# Patient Record
Sex: Female | Born: 1985
Health system: Southern US, Community
[De-identification: ages and names within clinical notes are randomized; demographics above are authoritative.]

## PROBLEM LIST (undated history)

## (undated) DIAGNOSIS — D500 Iron deficiency anemia secondary to blood loss (chronic): Secondary | ICD-10-CM

## (undated) DIAGNOSIS — O139 Gestational [pregnancy-induced] hypertension without significant proteinuria, unspecified trimester: Secondary | ICD-10-CM

## (undated) DIAGNOSIS — Q341 Congenital cyst of mediastinum: Secondary | ICD-10-CM

## (undated) HISTORY — DX: Morbid (severe) obesity due to excess calories: E66.01

## (undated) HISTORY — DX: Iron deficiency anemia secondary to blood loss (chronic): D50.0

---

## 2007-11-21 HISTORY — PX: GASTRIC BYPASS: SHX52

## 2008-05-20 HISTORY — PX: GASTRIC BYPASS: SHX52

## 2008-11-20 HISTORY — PX: BONE CYST EXCISION: SHX376

## 2010-09-20 HISTORY — PX: WISDOM TOOTH EXTRACTION: SHX21

## 2010-10-26 ENCOUNTER — Emergency Department (HOSPITAL_COMMUNITY)
Admission: EM | Admit: 2010-10-26 | Discharge: 2010-10-26 | Payer: Self-pay | Source: Home / Self Care | Admitting: Emergency Medicine

## 2011-01-31 LAB — POCT URINALYSIS DIPSTICK
Bilirubin Urine: NEGATIVE
Nitrite: NEGATIVE
pH: 5.5 (ref 5.0–8.0)

## 2011-03-22 LAB — ABO/RH: RH Type: POSITIVE

## 2011-05-22 ENCOUNTER — Ambulatory Visit (INDEPENDENT_AMBULATORY_CARE_PROVIDER_SITE_OTHER): Payer: Self-pay | Admitting: Surgery

## 2011-06-06 ENCOUNTER — Ambulatory Visit (INDEPENDENT_AMBULATORY_CARE_PROVIDER_SITE_OTHER): Payer: Self-pay | Admitting: Surgery

## 2011-06-06 ENCOUNTER — Ambulatory Visit (INDEPENDENT_AMBULATORY_CARE_PROVIDER_SITE_OTHER): Payer: Medicaid Other | Admitting: Surgery

## 2011-06-06 ENCOUNTER — Encounter (INDEPENDENT_AMBULATORY_CARE_PROVIDER_SITE_OTHER): Payer: Self-pay | Admitting: Surgery

## 2011-06-06 VITALS — BP 128/78 | HR 78 | Temp 96.4°F | Ht 66.5 in | Wt 300.0 lb

## 2011-06-06 DIAGNOSIS — L732 Hidradenitis suppurativa: Secondary | ICD-10-CM

## 2011-06-06 NOTE — Patient Instructions (Signed)
Follow up after your baby is delivered.  You will need excision of these area.

## 2011-06-06 NOTE — Progress Notes (Signed)
Lacey Whitaker is a 25 y.o. female.    Chief Complaint  Patient presents with  . Cyst    eval of cyst on mid chest    HPI HPI  The patient presents today due to the cyst over her sternum. She is at the request of Dr. Tanya Nones. The cyst has been present for many years. She had one removed in a similar spot last year. The area will swell and drained. It has been red and hot in the past. She was placed on antibiotics and the areas have got better recently. She is 6 months pregnant.   History reviewed. No pertinent past medical history.  Past Surgical History  Procedure Date  . Gastric bypass 05/2008    Family History  Problem Relation Age of Onset  . Asthma Mother   . Diabetes Mother   . Asthma Brother     Social History History  Substance Use Topics  . Smoking status: Never Smoker   . Smokeless tobacco: Not on file  . Alcohol Use: No     pregnant at time of appt    No Known Allergies  Current Outpatient Prescriptions  Medication Sig Dispense Refill  . Carbonyl Iron (FEOSOL PO) Take by mouth 2 (two) times daily. Patient unsure of dosage at this time.       . Nutritional Supplements (VITAMIN D BOOSTER PO) Take by mouth 4 (four) times daily.          Review of Systems Review of Systems  Constitutional: Negative for fever and chills.  HENT: Negative.   Eyes: Negative.   Respiratory: Negative.   Cardiovascular: Negative.   Gastrointestinal: Negative.   Genitourinary: Negative.   Musculoskeletal: Negative.   Skin: Negative.   Neurological: Negative.   Endo/Heme/Allergies: Negative.     Physical Exam Physical Exam  Constitutional: He appears well-developed and well-nourished.  HENT:  Head: Normocephalic and atraumatic.  Eyes: Conjunctivae and EOM are normal. Pupils are equal, round, and reactive to light.  Neck: Normal range of motion. Neck supple.  GI: Soft. Bowel sounds are normal.       gravid  Skin:          Along the inferior mammary fold are two area of  hidradenitis.  They are not infected or red.     Blood pressure 128/78, pulse 78, temperature 96.4 F (35.8 C), temperature source Temporal, height 5' 6.5" (1.689 m), weight 300 lb (136.079 kg).  Assessment/PlanHidradenitis involving inferior mammary fold bilaterally.  Plan: I have recommended excision of these areas after she delivered her baby. She has no signs of acute infection today she developed an acute infection, antibiotics with potential incision and drainage may be needed. She will return to see me in about 3 months to schedule outpatient surgery for this.   Jerney Baksh A. 06/06/2011, 11:31 AM

## 2011-07-13 ENCOUNTER — Other Ambulatory Visit: Payer: Self-pay | Admitting: Oncology

## 2011-07-13 ENCOUNTER — Encounter: Payer: Self-pay | Admitting: Oncology

## 2011-07-13 ENCOUNTER — Encounter (HOSPITAL_BASED_OUTPATIENT_CLINIC_OR_DEPARTMENT_OTHER): Payer: Self-pay | Admitting: Oncology

## 2011-07-13 DIAGNOSIS — D509 Iron deficiency anemia, unspecified: Secondary | ICD-10-CM

## 2011-07-13 LAB — COMPREHENSIVE METABOLIC PANEL
AST: 10 U/L (ref 0–37)
Alkaline Phosphatase: 118 U/L — ABNORMAL HIGH (ref 39–117)
BUN: 6 mg/dL (ref 6–23)
Calcium: 8.8 mg/dL (ref 8.4–10.5)
Chloride: 104 mEq/L (ref 96–112)
Creatinine, Ser: 0.56 mg/dL (ref 0.50–1.35)
Glucose, Bld: 77 mg/dL (ref 70–99)

## 2011-07-13 LAB — CBC WITH DIFFERENTIAL/PLATELET
Basophils Absolute: 0 10*3/uL (ref 0.0–0.1)
Eosinophils Absolute: 0.2 10*3/uL (ref 0.0–0.5)
HCT: 23 % — ABNORMAL LOW (ref 38.4–49.9)
HGB: 7.2 g/dL — ABNORMAL LOW (ref 13.0–17.1)
LYMPH%: 14 % (ref 14.0–49.0)
MCV: 62.2 fL — ABNORMAL LOW (ref 79.3–98.0)
MONO%: 6.6 % (ref 0.0–14.0)
NEUT#: 10.6 10*3/uL — ABNORMAL HIGH (ref 1.5–6.5)
NEUT%: 78 % — ABNORMAL HIGH (ref 39.0–75.0)
Platelets: 318 10*3/uL (ref 140–400)

## 2011-07-13 LAB — IRON AND TIBC
Iron: 15 ug/dL — ABNORMAL LOW (ref 42–165)
TIBC: 461 ug/dL — ABNORMAL HIGH (ref 215–435)
UIBC: 446 ug/dL

## 2011-07-13 LAB — VITAMIN B12: Vitamin B-12: 290 pg/mL (ref 211–911)

## 2011-07-13 LAB — MORPHOLOGY: PLT EST: ADEQUATE

## 2011-07-18 ENCOUNTER — Other Ambulatory Visit: Payer: Self-pay | Admitting: Obstetrics and Gynecology

## 2011-07-20 ENCOUNTER — Other Ambulatory Visit (HOSPITAL_COMMUNITY): Payer: Self-pay | Admitting: Obstetrics and Gynecology

## 2011-07-20 DIAGNOSIS — D649 Anemia, unspecified: Secondary | ICD-10-CM

## 2011-07-20 MED ORDER — DIPHENHYDRAMINE HCL 25 MG PO CAPS: 25.0000 mg | ORAL_CAPSULE | Freq: Once | ORAL | Status: DC

## 2011-07-20 MED ORDER — FERUMOXYTOL INJECTION 510 MG/17 ML: 1020.0000 mg | Freq: Once | INTRAVENOUS | Status: DC

## 2011-07-20 MED ORDER — ACETAMINOPHEN 325 MG PO TABS: 325.0000 mg | ORAL_TABLET | Freq: Once | ORAL | Status: AC

## 2011-07-25 ENCOUNTER — Ambulatory Visit (HOSPITAL_COMMUNITY)
Admission: AD | Admit: 2011-07-25 | Discharge: 2011-07-25 | Disposition: A | Discharge status: Home / Self Care | Payer: Medicaid Other | Attending: Obstetrics and Gynecology | Admitting: Obstetrics and Gynecology

## 2011-07-25 ENCOUNTER — Encounter (HOSPITAL_COMMUNITY): Payer: Self-pay | Admitting: *Deleted

## 2011-07-25 DIAGNOSIS — O99019 Anemia complicating pregnancy, unspecified trimester: Secondary | ICD-10-CM | POA: Insufficient documentation

## 2011-07-25 DIAGNOSIS — D649 Anemia, unspecified: Secondary | ICD-10-CM | POA: Insufficient documentation

## 2011-07-25 HISTORY — DX: Gestational (pregnancy-induced) hypertension without significant proteinuria, unspecified trimester: O13.9

## 2011-07-25 HISTORY — DX: Congenital cyst of mediastinum: Q34.1

## 2011-07-25 MED ORDER — ACETAMINOPHEN 325 MG PO TABS
325.0000 mg | ORAL_TABLET | Freq: Once | ORAL | Status: AC
  Administered 2011-07-25: 325 mg via ORAL
  Filled 2011-07-25: qty 1

## 2011-07-25 MED ORDER — DIPHENHYDRAMINE HCL 25 MG PO CAPS
25.0000 mg | ORAL_CAPSULE | Freq: Once | ORAL | Status: AC
  Administered 2011-07-25: 25 mg via ORAL
  Filled 2011-07-25: qty 1

## 2011-07-25 MED ORDER — SODIUM CHLORIDE 0.9 % IV SOLN
1020.0000 mg | Freq: Once | INTRAVENOUS | Status: AC
  Administered 2011-07-25: 1020 mg via INTRAVENOUS
  Filled 2011-07-25: qty 34

## 2011-07-25 NOTE — Progress Notes (Signed)
Iron infusion complete. Pt. Tolerated well. [name] A

## 2011-07-25 NOTE — Progress Notes (Signed)
IV Fereheme infusion started at 1030. Pt currently without complaints. [name] A

## 2011-07-25 NOTE — Progress Notes (Signed)
Patient has arrived this morning for IV Iron infusion.  We have discussed the rationale for this therapy and both she and her significant other have verbalized understanding. [name] A

## 2011-07-25 NOTE — Progress Notes (Signed)
Subjective: Patient reports for IV iron infusion.  Orders called in by Hematology.  No complaints.  Objective: I have reviewed patient's vital signs.  General: alert Resp: clear to auscultation bilaterally Cardio: regular rate and rhythm, S1, S2 normal, no murmur, click, rub or gallop Extremities: extremities normal, atraumatic, no cyanosis or edema  FHR 135 pre and 142 post infusion   Assessment/Plan: 25yo G3P2 at about 32wks presented for IV iron infusion.  Orders per hematology.  Exam per RN Lynnda Child.  Pt's BP slightly elevated after IV iron infusion and pt reported a remote h/o elevated BPs.  After observing for about an hour post infusion, pt's BPs had normalized and she was d/c'd home with f/u as scheduled in office.  LOS: 0 days    Jeniffer Culliver Y 07/25/2011, 5:24 PM

## 2011-07-25 NOTE — Progress Notes (Signed)
BP's elevated-Dr. Su Hilt made aware. Will continue to monitor BP q56min for an additional hour. [name] A

## 2011-07-27 ENCOUNTER — Encounter (INDEPENDENT_AMBULATORY_CARE_PROVIDER_SITE_OTHER): Payer: Self-pay | Admitting: Surgery

## 2011-08-09 ENCOUNTER — Other Ambulatory Visit: Payer: Self-pay | Admitting: Obstetrics and Gynecology

## 2011-08-10 ENCOUNTER — Other Ambulatory Visit: Payer: Self-pay | Admitting: Obstetrics and Gynecology

## 2011-08-10 ENCOUNTER — Other Ambulatory Visit (HOSPITAL_COMMUNITY): Payer: Self-pay | Admitting: Obstetrics and Gynecology

## 2011-08-10 DIAGNOSIS — D649 Anemia, unspecified: Secondary | ICD-10-CM

## 2011-08-10 DIAGNOSIS — J45909 Unspecified asthma, uncomplicated: Secondary | ICD-10-CM | POA: Insufficient documentation

## 2011-08-10 DIAGNOSIS — Z98891 History of uterine scar from previous surgery: Secondary | ICD-10-CM

## 2011-08-10 DIAGNOSIS — O2690 Pregnancy related conditions, unspecified, unspecified trimester: Secondary | ICD-10-CM | POA: Insufficient documentation

## 2011-08-16 LAB — STREP B DNA PROBE: GBS: NEGATIVE

## 2011-09-02 ENCOUNTER — Encounter (HOSPITAL_COMMUNITY): Payer: Self-pay

## 2011-09-02 ENCOUNTER — Encounter (HOSPITAL_COMMUNITY)
Admission: AD | Disposition: A | Discharge status: Home / Self Care | Payer: Self-pay | Source: Ambulatory Visit | Attending: Obstetrics and Gynecology

## 2011-09-02 ENCOUNTER — Encounter (HOSPITAL_COMMUNITY): Payer: Self-pay | Admitting: Anesthesiology

## 2011-09-02 ENCOUNTER — Inpatient Hospital Stay (HOSPITAL_COMMUNITY)
Admission: AD | Admit: 2011-09-02 | Discharge: 2011-09-05 | DRG: 765 | Disposition: A | Discharge status: Home / Self Care | Payer: Medicaid Other | Source: Ambulatory Visit | Attending: Obstetrics and Gynecology | Admitting: Obstetrics and Gynecology

## 2011-09-02 ENCOUNTER — Inpatient Hospital Stay (HOSPITAL_COMMUNITY): Payer: Medicaid Other | Admitting: Anesthesiology

## 2011-09-02 ENCOUNTER — Other Ambulatory Visit: Payer: Self-pay | Admitting: Obstetrics and Gynecology

## 2011-09-02 DIAGNOSIS — O4700 False labor before 37 completed weeks of gestation, unspecified trimester: Secondary | ICD-10-CM

## 2011-09-02 DIAGNOSIS — O2690 Pregnancy related conditions, unspecified, unspecified trimester: Secondary | ICD-10-CM

## 2011-09-02 DIAGNOSIS — O34219 Maternal care for unspecified type scar from previous cesarean delivery: Principal | ICD-10-CM | POA: Diagnosis present

## 2011-09-02 DIAGNOSIS — O10919 Unspecified pre-existing hypertension complicating pregnancy, unspecified trimester: Secondary | ICD-10-CM

## 2011-09-02 DIAGNOSIS — Z98891 History of uterine scar from previous surgery: Secondary | ICD-10-CM | POA: Diagnosis not present

## 2011-09-02 DIAGNOSIS — O99214 Obesity complicating childbirth: Secondary | ICD-10-CM | POA: Diagnosis present

## 2011-09-02 DIAGNOSIS — O1002 Pre-existing essential hypertension complicating childbirth: Secondary | ICD-10-CM | POA: Diagnosis present

## 2011-09-02 DIAGNOSIS — Z22330 Carrier of Group B streptococcus: Secondary | ICD-10-CM

## 2011-09-02 DIAGNOSIS — D649 Anemia, unspecified: Secondary | ICD-10-CM | POA: Diagnosis present

## 2011-09-02 DIAGNOSIS — E669 Obesity, unspecified: Secondary | ICD-10-CM | POA: Diagnosis present

## 2011-09-02 DIAGNOSIS — O9902 Anemia complicating childbirth: Secondary | ICD-10-CM | POA: Diagnosis present

## 2011-09-02 DIAGNOSIS — O99892 Other specified diseases and conditions complicating childbirth: Secondary | ICD-10-CM | POA: Diagnosis present

## 2011-09-02 DIAGNOSIS — IMO0002 Reserved for concepts with insufficient information to code with codable children: Secondary | ICD-10-CM

## 2011-09-02 LAB — CBC
HCT: 31.2 % — ABNORMAL LOW (ref 36.0–46.0)
HCT: 30.7 % — ABNORMAL LOW (ref 36.0–46.0)
Hemoglobin: 9.9 g/dL — ABNORMAL LOW (ref 12.0–15.0)
Hemoglobin: 9.5 g/dL — ABNORMAL LOW (ref 12.0–15.0)
MCH: 22.5 pg — ABNORMAL LOW (ref 26.0–34.0)
MCHC: 30.9 g/dL (ref 30.0–36.0)
MCV: 70.7 fL — ABNORMAL LOW (ref 78.0–100.0)
WBC: 9.9 10*3/uL (ref 4.0–10.5)

## 2011-09-02 LAB — ABO/RH: ABO/RH(D): O POS

## 2011-09-02 SURGERY — Surgical Case
Anesthesia: Spinal | Site: Abdomen | Wound class: Clean Contaminated

## 2011-09-02 MED ORDER — BUTORPHANOL TARTRATE 2 MG/ML IJ SOLN
2.0000 mg | Freq: Once | INTRAMUSCULAR | Status: AC
  Administered 2011-09-02: 2 mg via INTRAVENOUS
  Filled 2011-09-02: qty 1

## 2011-09-02 MED ORDER — ONDANSETRON HCL 4 MG PO TABS: 4.0000 mg | ORAL_TABLET | ORAL | Status: DC | PRN

## 2011-09-02 MED ORDER — EPHEDRINE 5 MG/ML INJ
INTRAVENOUS | Status: AC
  Filled 2011-09-02: qty 10

## 2011-09-02 MED ORDER — OXYTOCIN 20 UNITS IN LACTATED RINGERS INFUSION - SIMPLE
INTRAVENOUS | Status: DC | PRN
  Administered 2011-09-02 (×2): 20 [IU] via INTRAVENOUS

## 2011-09-02 MED ORDER — DIPHENHYDRAMINE HCL 50 MG/ML IJ SOLN: 25.0000 mg | INTRAMUSCULAR | Status: DC | PRN

## 2011-09-02 MED ORDER — DIPHENHYDRAMINE HCL 25 MG PO CAPS
25.0000 mg | ORAL_CAPSULE | ORAL | Status: DC | PRN
  Filled 2011-09-02 (×2): qty 1

## 2011-09-02 MED ORDER — BUPIVACAINE IN DEXTROSE 0.75-8.25 % IT SOLN
INTRATHECAL | Status: DC | PRN
  Administered 2011-09-02: 1.6 mL via INTRATHECAL

## 2011-09-02 MED ORDER — CEFAZOLIN SODIUM 1-5 GM-% IV SOLN
INTRAVENOUS | Status: AC
  Filled 2011-09-02: qty 50

## 2011-09-02 MED ORDER — SENNOSIDES-DOCUSATE SODIUM 8.6-50 MG PO TABS
2.0000 | ORAL_TABLET | Freq: Every day | ORAL | Status: DC
  Administered 2011-09-02 – 2011-09-04 (×3): 2 via ORAL

## 2011-09-02 MED ORDER — SCOPOLAMINE 1 MG/3DAYS TD PT72
1.0000 | MEDICATED_PATCH | Freq: Once | TRANSDERMAL | Status: DC
Start: 2011-09-02 — End: 2011-09-02
  Administered 2011-09-02: 1.5 mg via TRANSDERMAL

## 2011-09-02 MED ORDER — LACTATED RINGERS IV SOLN
INTRAVENOUS | Status: DC
  Administered 2011-09-02 (×3): via INTRAVENOUS

## 2011-09-02 MED ORDER — CITRIC ACID-SODIUM CITRATE 334-500 MG/5ML PO SOLN
30.0000 mL | Freq: Once | ORAL | Status: AC
  Administered 2011-09-02: 30 mL via ORAL
  Filled 2011-09-02: qty 15

## 2011-09-02 MED ORDER — FENTANYL CITRATE 0.05 MG/ML IJ SOLN
INTRAMUSCULAR | Status: AC
  Filled 2011-09-02: qty 2

## 2011-09-02 MED ORDER — WITCH HAZEL-GLYCERIN EX PADS: 1.0000 "application " | MEDICATED_PAD | CUTANEOUS | Status: DC | PRN

## 2011-09-02 MED ORDER — LANOLIN HYDROUS EX OINT: 1.0000 "application " | TOPICAL_OINTMENT | CUTANEOUS | Status: DC | PRN

## 2011-09-02 MED ORDER — CEFOXITIN SODIUM 2 G IV SOLR
2.0000 g | Freq: Once | INTRAVENOUS | Status: AC
  Administered 2011-09-02: 2 g via INTRAVENOUS
  Filled 2011-09-02: qty 2

## 2011-09-02 MED ORDER — ONDANSETRON HCL 4 MG/2ML IJ SOLN: 4.0000 mg | INTRAMUSCULAR | Status: DC | PRN

## 2011-09-02 MED ORDER — KETOROLAC TROMETHAMINE 30 MG/ML IJ SOLN
30.0000 mg | Freq: Once | INTRAMUSCULAR | Status: AC
  Administered 2011-09-02: 30 mg via INTRAVENOUS
  Filled 2011-09-02: qty 1

## 2011-09-02 MED ORDER — FENTANYL CITRATE 0.05 MG/ML IJ SOLN
INTRAMUSCULAR | Status: AC
  Administered 2011-09-02: 50 ug via INTRAVENOUS
  Filled 2011-09-02: qty 2

## 2011-09-02 MED ORDER — SIMETHICONE 80 MG PO CHEW
80.0000 mg | CHEWABLE_TABLET | Freq: Three times a day (TID) | ORAL | Status: DC
  Administered 2011-09-02 – 2011-09-05 (×10): 80 mg via ORAL

## 2011-09-02 MED ORDER — OXYTOCIN 10 UNIT/ML IJ SOLN
INTRAMUSCULAR | Status: AC
  Filled 2011-09-02: qty 2

## 2011-09-02 MED ORDER — ONDANSETRON HCL 4 MG/2ML IJ SOLN
INTRAMUSCULAR | Status: DC | PRN
  Administered 2011-09-02: 4 mg via INTRAVENOUS

## 2011-09-02 MED ORDER — ONDANSETRON HCL 4 MG/2ML IJ SOLN
INTRAMUSCULAR | Status: AC
  Filled 2011-09-02: qty 2

## 2011-09-02 MED ORDER — OXYTOCIN 20 UNITS IN LACTATED RINGERS INFUSION - SIMPLE: 125.0000 mL/h | INTRAVENOUS | Status: AC

## 2011-09-02 MED ORDER — METOCLOPRAMIDE HCL 5 MG/ML IJ SOLN: 10.0000 mg | Freq: Once | INTRAMUSCULAR | Status: DC | PRN

## 2011-09-02 MED ORDER — FENTANYL CITRATE 0.05 MG/ML IJ SOLN
INTRAMUSCULAR | Status: DC | PRN
  Administered 2011-09-02: 25 ug via INTRATHECAL

## 2011-09-02 MED ORDER — MORPHINE SULFATE (PF) 0.5 MG/ML IJ SOLN
INTRAMUSCULAR | Status: DC | PRN
  Administered 2011-09-02: .15 mg via INTRATHECAL

## 2011-09-02 MED ORDER — CEFAZOLIN SODIUM-DEXTROSE 2-3 GM-% IV SOLR
2.0000 g | INTRAVENOUS | Status: DC
  Filled 2011-09-02: qty 50

## 2011-09-02 MED ORDER — FAMOTIDINE IN NACL 20-0.9 MG/50ML-% IV SOLN
20.0000 mg | Freq: Once | INTRAVENOUS | Status: AC
  Administered 2011-09-02: 20 mg via INTRAVENOUS
  Filled 2011-09-02: qty 50

## 2011-09-02 MED ORDER — CEFAZOLIN SODIUM 1-5 GM-% IV SOLN
INTRAVENOUS | Status: DC | PRN
  Administered 2011-09-02: 1 g via INTRAVENOUS

## 2011-09-02 MED ORDER — DIPHENHYDRAMINE HCL 50 MG/ML IJ SOLN
12.5000 mg | INTRAMUSCULAR | Status: DC | PRN
  Administered 2011-09-02 (×2): 12.5 mg via INTRAVENOUS
  Filled 2011-09-02: qty 1

## 2011-09-02 MED ORDER — TETANUS-DIPHTH-ACELL PERTUSSIS 5-2.5-18.5 LF-MCG/0.5 IM SUSP
0.5000 mL | Freq: Once | INTRAMUSCULAR | Status: AC
  Administered 2011-09-04: 0.5 mL via INTRAMUSCULAR
  Filled 2011-09-02: qty 0.5

## 2011-09-02 MED ORDER — OXYCODONE-ACETAMINOPHEN 5-325 MG PO TABS
1.0000 | ORAL_TABLET | ORAL | Status: DC | PRN
  Administered 2011-09-03: 1 via ORAL
  Administered 2011-09-03: 2 via ORAL
  Administered 2011-09-03: 1 via ORAL
  Administered 2011-09-03: 2 via ORAL
  Administered 2011-09-03 (×3): 1 via ORAL
  Administered 2011-09-04 (×2): 2 via ORAL
  Administered 2011-09-04: 1 via ORAL
  Administered 2011-09-04 (×2): 2 via ORAL
  Administered 2011-09-04: 1 via ORAL
  Administered 2011-09-05 (×4): 2 via ORAL
  Filled 2011-09-02: qty 1
  Filled 2011-09-02: qty 2
  Filled 2011-09-02: qty 1
  Filled 2011-09-02: qty 2
  Filled 2011-09-02 (×2): qty 1
  Filled 2011-09-02 (×2): qty 2
  Filled 2011-09-02: qty 1
  Filled 2011-09-02: qty 2
  Filled 2011-09-02 (×2): qty 1
  Filled 2011-09-02 (×5): qty 2

## 2011-09-02 MED ORDER — ZOLPIDEM TARTRATE 5 MG PO TABS: 5.0000 mg | ORAL_TABLET | Freq: Every evening | ORAL | Status: DC | PRN

## 2011-09-02 MED ORDER — DIPHENHYDRAMINE HCL 25 MG PO CAPS
25.0000 mg | ORAL_CAPSULE | Freq: Four times a day (QID) | ORAL | Status: DC | PRN
  Administered 2011-09-03: 25 mg via ORAL

## 2011-09-02 MED ORDER — KETOROLAC TROMETHAMINE 60 MG/2ML IM SOLN
INTRAMUSCULAR | Status: AC
  Administered 2011-09-02: 60 mg via INTRAMUSCULAR
  Filled 2011-09-02: qty 2

## 2011-09-02 MED ORDER — LACTATED RINGERS IV SOLN
INTRAVENOUS | Status: DC
  Administered 2011-09-02: 17:00:00 via INTRAVENOUS

## 2011-09-02 MED ORDER — SIMETHICONE 80 MG PO CHEW: 80.0000 mg | CHEWABLE_TABLET | ORAL | Status: DC | PRN

## 2011-09-02 MED ORDER — MENTHOL 3 MG MT LOZG: 1.0000 | LOZENGE | OROMUCOSAL | Status: DC | PRN

## 2011-09-02 MED ORDER — FENTANYL CITRATE 0.05 MG/ML IJ SOLN
25.0000 ug | INTRAMUSCULAR | Status: DC | PRN
  Administered 2011-09-02 (×3): 50 ug via INTRAVENOUS

## 2011-09-02 MED ORDER — DIPHENHYDRAMINE HCL 50 MG/ML IJ SOLN
INTRAMUSCULAR | Status: AC
  Administered 2011-09-02: 12.5 mg via INTRAVENOUS
  Filled 2011-09-02: qty 1

## 2011-09-02 MED ORDER — EPHEDRINE SULFATE 50 MG/ML IJ SOLN
INTRAMUSCULAR | Status: DC | PRN
  Administered 2011-09-02 (×3): 10 mg via INTRAVENOUS

## 2011-09-02 MED ORDER — PRENATAL PLUS 27-1 MG PO TABS
1.0000 | ORAL_TABLET | Freq: Every day | ORAL | Status: DC
  Administered 2011-09-03 – 2011-09-05 (×3): 1 via ORAL
  Filled 2011-09-02 (×3): qty 1

## 2011-09-02 MED ORDER — KETOROLAC TROMETHAMINE 60 MG/2ML IM SOLN
60.0000 mg | Freq: Once | INTRAMUSCULAR | Status: AC | PRN
  Administered 2011-09-02: 60 mg via INTRAMUSCULAR

## 2011-09-02 MED ORDER — SCOPOLAMINE 1 MG/3DAYS TD PT72
MEDICATED_PATCH | TRANSDERMAL | Status: AC
  Administered 2011-09-02: 1.5 mg via TRANSDERMAL
  Filled 2011-09-02: qty 1

## 2011-09-02 MED ORDER — FENTANYL CITRATE 0.05 MG/ML IJ SOLN: 25.0000 ug | INTRAMUSCULAR | Status: DC | PRN

## 2011-09-02 MED ORDER — DIBUCAINE 1 % RE OINT: 1.0000 "application " | TOPICAL_OINTMENT | RECTAL | Status: DC | PRN

## 2011-09-02 MED ORDER — MORPHINE SULFATE 0.5 MG/ML IJ SOLN
INTRAMUSCULAR | Status: AC
  Filled 2011-09-02: qty 10

## 2011-09-02 MED ORDER — PHENYLEPHRINE 40 MCG/ML (10ML) SYRINGE FOR IV PUSH (FOR BLOOD PRESSURE SUPPORT)
PREFILLED_SYRINGE | INTRAVENOUS | Status: AC
  Filled 2011-09-02: qty 5

## 2011-09-02 MED ORDER — MEPERIDINE HCL 25 MG/ML IJ SOLN: 6.2500 mg | INTRAMUSCULAR | Status: DC | PRN

## 2011-09-02 MED ORDER — IBUPROFEN 600 MG PO TABS: 600.0000 mg | ORAL_TABLET | Freq: Four times a day (QID) | ORAL | Status: DC

## 2011-09-02 MED ORDER — ENOXAPARIN SODIUM 30 MG/0.3ML ~~LOC~~ SOLN
30.0000 mg | SUBCUTANEOUS | Status: DC
  Administered 2011-09-02: 30 mg via SUBCUTANEOUS
  Filled 2011-09-02 (×3): qty 0.3

## 2011-09-02 SURGICAL SUPPLY — 39 items
BENZOIN TINCTURE PRP APPL 2/3 (GAUZE/BANDAGES/DRESSINGS) ×2 IMPLANT
CHLORAPREP W/TINT 26ML (MISCELLANEOUS) IMPLANT
CLOSURE STERI STRIP 1/2 X4 (GAUZE/BANDAGES/DRESSINGS) ×2 IMPLANT
CLOTH BEACON ORANGE TIMEOUT ST (SAFETY) ×2 IMPLANT
CONTAINER PREFILL 10% NBF 15ML (MISCELLANEOUS) IMPLANT
DRAIN JACKSON PRT FLT 10 (DRAIN) ×2 IMPLANT
DRESSING TELFA 8X3 (GAUZE/BANDAGES/DRESSINGS) ×2 IMPLANT
DRSG PAD ABDOMINAL 8X10 ST (GAUZE/BANDAGES/DRESSINGS) ×2 IMPLANT
ELECT REM PT RETURN 9FT ADLT (ELECTROSURGICAL) ×2
ELECTRODE REM PT RTRN 9FT ADLT (ELECTROSURGICAL) ×1 IMPLANT
EVACUATOR SILICONE 100CC (DRAIN) ×2 IMPLANT
EXTRACTOR VACUUM M CUP 4 TUBE (SUCTIONS) IMPLANT
GLOVE BIO SURGEON STRL SZ7.5 (GLOVE) ×4 IMPLANT
GLOVE BIOGEL PI IND STRL 7.5 (GLOVE) ×1 IMPLANT
GLOVE BIOGEL PI INDICATOR 7.5 (GLOVE) ×1
GOWN PREVENTION PLUS LG XLONG (DISPOSABLE) ×6 IMPLANT
GOWN STRL REIN XL XLG (GOWN DISPOSABLE) ×2 IMPLANT
KIT ABG SYR 3ML LUER SLIP (SYRINGE) IMPLANT
NEEDLE HYPO 22GX1.5 SAFETY (NEEDLE) IMPLANT
NEEDLE HYPO 25X5/8 SAFETYGLIDE (NEEDLE) IMPLANT
NS IRRIG 1000ML POUR BTL (IV SOLUTION) ×2 IMPLANT
PACK C SECTION WH (CUSTOM PROCEDURE TRAY) ×2 IMPLANT
RETRACTOR WND ALEXIS 25 LRG (MISCELLANEOUS) ×1 IMPLANT
RTRCTR WOUND ALEXIS 25CM LRG (MISCELLANEOUS) ×2
SLEEVE SCD COMPRESS KNEE MED (MISCELLANEOUS) ×2 IMPLANT
SPONGE GAUZE 4X4 12PLY (GAUZE/BANDAGES/DRESSINGS) ×2 IMPLANT
STRIP CLOSURE SKIN 1/2X4 (GAUZE/BANDAGES/DRESSINGS) ×2 IMPLANT
SUT CHROMIC 2 0 CT 1 (SUTURE) ×2 IMPLANT
SUT MNCRL AB 3-0 PS2 27 (SUTURE) ×2 IMPLANT
SUT PLAIN 0 NONE (SUTURE) IMPLANT
SUT PLAIN 2 0 XLH (SUTURE) ×2 IMPLANT
SUT VIC AB 0 CT1 36 (SUTURE) ×2 IMPLANT
SUT VIC AB 0 CTX 36 (SUTURE) ×4
SUT VIC AB 0 CTX36XBRD ANBCTRL (SUTURE) ×4 IMPLANT
SYR CONTROL 10ML LL (SYRINGE) IMPLANT
TAPE CLOTH SURG 4X10 WHT LF (GAUZE/BANDAGES/DRESSINGS) ×2 IMPLANT
TOWEL OR 17X24 6PK STRL BLUE (TOWEL DISPOSABLE) ×4 IMPLANT
TRAY FOLEY CATH 14FR (SET/KITS/TRAYS/PACK) ×2 IMPLANT
WATER STERILE IRR 1000ML POUR (IV SOLUTION) IMPLANT

## 2011-09-02 NOTE — Anesthesia Procedure Notes (Addendum)
Spinal Block  Patient location during procedure: OR Start time: 09/02/2011 8:56 AM Staffing Anesthesiologist: Aurther Harlin A. Performed by: anesthesiologist  Preanesthetic Checklist Completed: patient identified, site marked, surgical consent, pre-op evaluation, timeout performed, IV checked, risks and benefits discussed and monitors and equipment checked Spinal Block Patient position: sitting Prep: site prepped and draped and DuraPrep Patient monitoring: heart rate, cardiac monitor, continuous pulse ox and blood pressure Approach: midline Location: L3-4 Injection technique: single-shot Needle Needle type: Sprotte and Pencan  Needle gauge: 24 G Needle length: 10 cm Needle insertion depth: 8 cm Assessment Sensory level: T4 Additional Notes Patient tolerated procedure well. Adequate surgical anesthetic level.

## 2011-09-02 NOTE — Progress Notes (Signed)
Patient is brought in by ems. She states that she broke her water at 0535am, clear fluids and frequent contractions started about 5 minutes thereafter. She reports good fetal movement

## 2011-09-02 NOTE — ED Notes (Signed)
GBS negative in [facility] chart. GBS positive in patients chart.

## 2011-09-02 NOTE — ED Notes (Signed)
Lab called to get update on Type and screen results. 20 more mins. Patient notified of plan of care and OR  Plan.

## 2011-09-02 NOTE — Op Note (Signed)
Cesarean Section Procedure Note  Indications: P2 38 2/7wks with SROM in labor desiring repeat c-section.  Pre-operative Diagnosis: Previous Cesarean Section   Post-operative Diagnosis: Previous Cesarean Section  Procedure: REPEAT CESAREAN SECTION  Surgeon: Purcell Nails, MD    Assistants: Sanda Klein, CNM  Anesthesia: Spinal  Anesthesiologist: Tyrone Apple. Foster   Procedure Details  The patient was taken to the operating room after the risks, benefits, complications, treatment options, and expected outcomes were discussed with the patient.  The patient concurred with the proposed plan, giving informed consent. The patient was taken to Operating Room 1, identified as Lacey Whitaker and the procedure verified as C-Section Delivery. A Time Out was held and the above information confirmed.  After induction of anesthesia by obtaining a surgical level via the epidural, the patient was prepped and draped in the usual sterile manner. A Pfannenstiel skin incision was made and carried down through the subcutaneous tissue to the underlying layer of fascia.  The fascia was incised bilaterally and extended transversely bilaterally with the Mayo scissors. Kocher clamps were placed on the inferior aspect of the fascial incision and the underlying rectus muscle was separated from the fascia. The same was done on the superior aspect of the fascial incision.  The peritoneum was identified, entered bluntly and extended manually. The utero-vesical peritoneal reflection was incised transversely and the bladder flap was bluntly freed from the lower uterine segment. A low transverse uterine incision was made with the scalpel and extended bilaterally with the bandage scissors.  The infant was delivered in vertex position without difficulty.  After the umbilical cord was clamped and cut, the infant was handed to the awaiting pediatricians.  Cord blood was obtained for evaluation.  The placenta was removed intact  and appeared to be within normal limits. The uterus was cleared of all clots and debris. The uterine incision was closed with running interlocking sutures of 0 Vicryl and a second imbricating layer was performed as well.   Bilateral tubes and ovaries appeared to be within normal limits.  Good hemostasis was noted.  Copious irrigation was performed until clear. The peritoneum was repaired with 2-0 chromic via a running suture.  The fascia was reapproximated with a running suture of 0 Vicryl. The subcutaneous tissue was reapproximated with 3 interrupted sutures of 2-0 plain.  A size 10 JP drain was placed and sutured with 3-0 silk.  The skin was reapproximated with a subcuticular suture of 3-0 monocryl.  Half inch steristrips were applied with benzoin and pressure dressing applied.  Instrument, sponge, and needle counts were correct prior to abdominal closure and at the conclusion of the case.  The patient was awaiting transfer to the recovery room in good condition.  Findings: Live female infant with Apgars 9 at one minute and 9 at five minutes.  Normal appearing bilateral ovaries and fallopian tubes were noted.  Estimated Blood Loss:          Drains: foley with 200cc clear urine.  JP drain placed as well.         Total IV Fluids:         Specimens to Pathology: Placenta         Complications:  None; patient tolerated the procedure well.         Disposition: PACU - hemodynamically stable.         Condition: stable  Attending Attestation: I performed the procedure.

## 2011-09-02 NOTE — Anesthesia Preprocedure Evaluation (Signed)
Anesthesia Evaluation  Name, MR# and DOB Patient awake  General Assessment Comment  Reviewed: Allergy & Precautions, H&P , Patient's Chart, lab work & pertinent test results  History of Anesthesia Complications (+) PONV  Airway Mallampati: III TM Distance: >3 FB Neck ROM: Full    Dental No notable dental hx. (+) Teeth Intact   Pulmonary  Last used albuterol nebulizer yesterday. Patient has current URI  clear to auscultation  Pulmonary exam normal       Cardiovascular Regular Normal    Neuro/Psych Negative Neurological ROS  Negative Psych ROS   GI/Hepatic negative GI ROS Neg liver ROS    Endo/Other  Negative Endocrine ROSMorbid obesity  Renal/GU negative Renal ROS  Genitourinary negative   Musculoskeletal   Abdominal   Peds  Hematology negative hematology ROS (+)   Anesthesia Other Findings   Reproductive/Obstetrics (+) Pregnancy                           Anesthesia Physical Anesthesia Plan  ASA: III and Emergent  Anesthesia Plan: Spinal   Post-op Pain Management:    Induction:   Airway Management Planned:   Additional Equipment:   Intra-op Plan:   Post-operative Plan:   Informed Consent: I have reviewed the patients History and Physical, chart, labs and discussed the procedure including the risks, benefits and alternatives for the proposed anesthesia with the patient or authorized representative who has indicated his/her understanding and acceptance.     Plan Discussed with: Anesthesiologist  Anesthesia Plan Comments:         Anesthesia Quick Evaluation

## 2011-09-02 NOTE — H&P (Signed)
Lacey Whitaker is a 25 y.o. black female presenting at 37.3 weeks per [facility] 09/19/11 unannounced via EMS complaining of ctxs q 3 minutes and SROM around 0535.   Does report recent cold with cough and chest tight--feels better to sit up.  Current meds:  Feosol & Vit D 4000IU qd. Maternal Medical History:  Reason for admission: Reason for admission: rupture of membranes and contractions.  Contractions: Onset was 1-2 hours ago.   Frequency: regular.   Perceived severity is strong.    Fetal activity: Perceived fetal activity is normal.   Last perceived fetal movement was within the past hour.    Prenatal complications: 1.  Severe anemia w/ iron infusion x1 in late 3rd trimester 2.  Morbidly obese 3.  H/o gastric bypass '09 4.  Vit D deficiency 5.  CHTN--no meds 6.  Prev c/s x2 (repeat schedule 09/11/11) 7.  Asthma 8.  GBS positive in urine 9.  H/o macrosomia G2 10.  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  Recurring thoracic cyst (sternal area)--removed in past by PCP; to see surgeon to remove PP 12. 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  HgA1c early preg =6.7; early 3rd trimester = 5.8; 1hr gtt in 50's (hypoglycemic).    OB History    Grav Para Term Preterm Abortions TAB SAB Ect Mult Living   3 2        1      Past Medical History  Diagnosis Date  . Asthma   . Anemia   . Pregnancy induced hypertension     2006  . Cyst, mediastinum     pt. reports having the cyst removed last year and has reoccured   Past Surgical History  Procedure Date  . Gastric bypass 05/2008  . Cesarean section   . Gastric bypass 2009  . Wisdom tooth extraction 09/2010  . Bone cyst excision 2010   Family History: family history includes Asthma in her brother and mother and Diabetes in her mother. Social History:  reports that she has never smoked. She does not have any smokeless tobacco history on file. She reports that she does not use illicit drugs. Her alcohol history not on file.  Review of Systems  Constitutional: Negative.     Respiratory: Positive for cough.        Recent congestion/ sinus pressure & HA's occasionally  Cardiovascular: Negative.   Gastrointestinal: Negative.   Genitourinary: Negative.   Musculoskeletal: Negative.   Neurological: Negative.     Dilation: 2.5 Effacement (%): 60 Station: -2 Last menstrual period 12/17/2010. Maternal Exam:  Uterine Assessment: Contraction strength is moderate.  Contraction frequency is regular.  UC's q 3-5 min  Abdomen: Patient reports no abdominal tenderness. Surgical scars: low transverse.   Fetal presentation: vertex  Introitus: Normal vulva. Ferning test: positive.  Nitrazine test: not done. Amniotic fluid character: clear.  Cervix: Cervix evaluated by digital exam.     Fetal Exam Fetal Monitor Review: Mode: ultrasound.   Baseline rate: 140.  Variability: moderate (6-25 bpm).   Pattern: accelerations present and no decelerations.    Fetal State Assessment: Category I - tracings are normal.     Physical Exam  Constitutional: She is oriented to person, place, and time. She appears well-developed and well-nourished.       Breathing w/ ctxs  Cardiovascular: Normal rate and regular rhythm.   Respiratory: Effort normal and breath sounds normal.  GI: Soft. Bowel sounds are normal.  Genitourinary:       Cx:  2-3/60/-2  Musculoskeletal: She  exhibits edema.       Trace to mild generalized pedal edema  Neurological: She is alert and oriented to person, place, and time. She has normal reflexes.       No clonus  Skin: Skin is warm and dry.  Psychiatric: She has a normal mood and affect. Her behavior is normal. Thought content normal.    Prenatal labs: ABO, Rh:  O positive Antibody:  negative Rubella:  immune RPR:   nonreactive HBsAg:   negative HIV:   nonreactive GBS:   positive  Assessment/Plan: 1.  IUP at 37.3 2.  SROM 0535 with early labor 3.  Morbidly obese 4.  CHTN 5.  Prev c/s x2 w/ desire for repeat 6.  Stable asthma 7.  H/o  severe anemia w/ iron transfusion x1 in pregnancy 8.  H/o depression--no meds in pregnancy 9.  H/o gastric bypass '09 10.  GBS positive in urine 11. H/o macrosomia 12.  Elevated HgA1c in pregnancy; hypoglycemic 1hr gtt  1.  Admit to [facility] with dr. Su Hilt as attending 2.  Routine preop orders 3.  Repeat c/s vs TOLAC disc'd, but repeat recommended secondary to h/o 2 previous c/s; R/B/A rev'd with pt for c/s including but not limited to bleeding, infection, damage to surrounding organs, and anesthesia complications; pt verbalized understanding and agreeable to proceed with repeat c/s for delivery.  Declines tubal ligation. 4.  Plans to bottle feed   STEELMAN,CANDICE H 09/02/2011, 6:51 AM

## 2011-09-02 NOTE — Anesthesia Postprocedure Evaluation (Signed)
  Anesthesia Post-op Note  Patient: Lacey Whitaker  Procedure(s) Performed:  CESAREAN SECTION  Patient Location: PACU  Anesthesia Type: Spinal  Level of Consciousness: awake, alert  and oriented  Airway and Oxygen Therapy: Patient Spontanous Breathing  Post-op Pain: none  Post-op Assessment: Post-op Vital signs reviewed, Patient's Cardiovascular Status Stable, Respiratory Function Stable, Patent Airway, No signs of Nausea or vomiting, Pain level controlled, No headache, No backache, No residual numbness and No residual motor weakness  Post-op Vital Signs: Reviewed and stable  Complications: No apparent anesthesia complications

## 2011-09-02 NOTE — Transfer of Care (Signed)
Immediate Anesthesia Transfer of Care Note  Patient: Lacey Whitaker  Procedure(s) Performed:  CESAREAN SECTION  Patient Location: PACU  Anesthesia Type: Spinal  Level of Consciousness: awake, alert  and oriented  Airway & Oxygen Therapy: Patient Spontanous Breathing  Post-op Assessment: Report given to PACU RN and Post -op Vital signs reviewed and stable  Post vital signs: Reviewed and stable  Complications: No apparent anesthesia complications

## 2011-09-02 NOTE — Progress Notes (Signed)
In OR room 1. Following spinal placement by Dr. Malen Gauze, I attempted to assess fetal heart tones. Unable to determine fetal rate. Dr. Su Hilt at patients side, Dr. Malen Gauze aware. Monitor picking up HR 78. Monitor removed as stated by Dr. Su Hilt.

## 2011-09-03 ENCOUNTER — Encounter (HOSPITAL_COMMUNITY): Payer: Self-pay | Admitting: *Deleted

## 2011-09-03 LAB — CBC
Hemoglobin: 8.4 g/dL — ABNORMAL LOW (ref 12.0–15.0)
MCH: 22.6 pg — ABNORMAL LOW (ref 26.0–34.0)
MCHC: 31.2 g/dL (ref 30.0–36.0)
MCV: 72.5 fL — ABNORMAL LOW (ref 78.0–100.0)
RBC: 3.71 MIL/uL — ABNORMAL LOW (ref 3.87–5.11)

## 2011-09-03 MED ORDER — FERROUS SULFATE 325 (65 FE) MG PO TABS
325.0000 mg | ORAL_TABLET | Freq: Three times a day (TID) | ORAL | Status: DC
  Administered 2011-09-03 – 2011-09-05 (×5): 325 mg via ORAL
  Filled 2011-09-03 (×6): qty 1

## 2011-09-03 NOTE — Progress Notes (Signed)
Subjective: Postpartum Day 1: Cesarean Delivery Patient reports incisional pain, but pain medication effective.  Undecided regarding birth control or method of feeding.  Foley out, voiding without difficulty.   Objective: Vital signs in last 24 hours: Temp:  [97.8 F (36.6 C)-98.4 F (36.9 C)] 98.2 F (36.8 C) (10/14 1423) Pulse Rate:  [75-96] 82  (10/14 1423) Resp:  [18-24] 18  (10/14 0600) BP: (106-132)/(65-83) 132/83 mmHg (10/14 1423) SpO2:  [97 %-100 %] 98 % (10/14 0600)  Physical Exam:  General: alert Lochia: appropriate Uterine Fundus: firm Incision: Small amount old drainage at lower border of dressing.  JP draining small amount serosanguinous drainage. DVT Evaluation: No evidence of DVT seen on physical exam. Negative Homan's sign.  SCDs on.   Basename 09/03/11 0544 09/02/11 1330  HGB 8.4* 9.5*  HCT 26.9* 30.7*    Assessment/Plan: Status post Cesarean section. Doing well postoperatively.  Continue current care. Check orthostatics Start Fe.  Lacey Whitaker 09/03/2011, 2:23 PM

## 2011-09-03 NOTE — Progress Notes (Signed)
Encounter addended by: Len Blalock on: 09/03/2011  3:53 PM<BR>     Documentation filed: Notes Section

## 2011-09-03 NOTE — Anesthesia Postprocedure Evaluation (Signed)
  Anesthesia Post-op Note  Patient: Lacey Whitaker  Procedure(s) Performed:  CESAREAN SECTION  Patient Location: PACU and Mother/Baby  Anesthesia Type: Spinal  Level of Consciousness: awake, alert  and oriented  Airway and Oxygen Therapy: Patient Spontanous Breathing  Post-op Assessment: Patient's Cardiovascular Status Stable and Respiratory Function Stable  Post-op Vital Signs: stable  Complications: No apparent anesthesia complications

## 2011-09-04 ENCOUNTER — Encounter (HOSPITAL_COMMUNITY): Payer: Self-pay | Admitting: Obstetrics and Gynecology

## 2011-09-04 MED ORDER — ENOXAPARIN SODIUM 40 MG/0.4ML ~~LOC~~ SOLN
40.0000 mg | SUBCUTANEOUS | Status: DC
  Administered 2011-09-04: 40 mg via SUBCUTANEOUS
  Filled 2011-09-04 (×2): qty 0.4

## 2011-09-04 NOTE — Progress Notes (Signed)
Subjective: Postpartum Day 2: Cesarean Delivery Patient reports tolerating PO, + flatus and no problems voiding.  Formula-feeding; Inquiring about how to go about getting a car seat--will ask RN to investigate is SW would be able to help.  VB stable.  No BM yet.  Objective: Vital signs in last 24 hours: Temp:  [98.2 F (36.8 C)-98.3 F (36.8 C)] 98.3 F (36.8 C) (10/15 0605) Pulse Rate:  [82-98] 85  (10/15 0605) Resp:  [20-24] 20  (10/15 0605) BP: (107-134)/(68-83) 134/83 mmHg (10/15 9562)  Physical Exam:  General: alert, cooperative, no distress and morbidly obese Lochia: appropriate Uterine Fundus: firm below umbilicus Incision: healing well, no significant drainage DVT Evaluation: No evidence of DVT seen on physical exam. Negative Homan's sign. Lt pedal edema > RT   Basename 09/03/11 0544 09/02/11 1330  HGB 8.4* 9.5*  HCT 26.9* 30.7*    Assessment/Plan: Status post Cesarean section. Postoperative course complicated by anemia.    Continue current care.  Sephira Zellman H 09/04/2011, 10:52 AM

## 2011-09-04 NOTE — Progress Notes (Signed)
UR chart review completed.  

## 2011-09-05 ENCOUNTER — Encounter: Payer: Self-pay | Admitting: *Deleted

## 2011-09-05 DIAGNOSIS — Z98891 History of uterine scar from previous surgery: Secondary | ICD-10-CM | POA: Diagnosis not present

## 2011-09-05 LAB — RPR: RPR Ser Ql: NONREACTIVE

## 2011-09-05 LAB — CBC
HCT: 27.8 % — ABNORMAL LOW (ref 36.0–46.0)
MCH: 23 pg — ABNORMAL LOW (ref 26.0–34.0)
MCV: 73.4 fL — ABNORMAL LOW (ref 78.0–100.0)
RBC: 3.79 MIL/uL — ABNORMAL LOW (ref 3.87–5.11)
WBC: 9.1 10*3/uL (ref 4.0–10.5)

## 2011-09-05 LAB — COMPREHENSIVE METABOLIC PANEL
BUN: 7 mg/dL (ref 6–23)
CO2: 29 mEq/L (ref 19–32)
Calcium: 8.8 mg/dL (ref 8.4–10.5)
Creatinine, Ser: 0.79 mg/dL (ref 0.50–1.10)
GFR calc Af Amer: >90 mL/min (ref >90)
GFR calc non Af Amer: >90 mL/min (ref >90)
Glucose, Bld: 68 mg/dL — ABNORMAL LOW (ref 70–99)
Total Protein: 6.1 g/dL (ref 6.0–8.3)

## 2011-09-05 MED ORDER — NALBUPHINE HCL 10 MG/ML IJ SOLN
5.0000 mg | INTRAMUSCULAR | Status: DC | PRN
  Filled 2011-09-05: qty 1

## 2011-09-05 MED ORDER — MEPERIDINE HCL 25 MG/ML IJ SOLN: 6.2500 mg | INTRAMUSCULAR | Status: DC | PRN

## 2011-09-05 MED ORDER — IBUPROFEN 600 MG PO TABS: 600.0000 mg | ORAL_TABLET | Freq: Four times a day (QID) | ORAL | Status: AC | PRN

## 2011-09-05 MED ORDER — ONDANSETRON HCL 4 MG/2ML IJ SOLN: 4.0000 mg | Freq: Three times a day (TID) | INTRAMUSCULAR | Status: DC | PRN

## 2011-09-05 MED ORDER — IBUPROFEN 600 MG PO TABS: 600.0000 mg | ORAL_TABLET | Freq: Four times a day (QID) | ORAL | Status: DC | PRN

## 2011-09-05 MED ORDER — SODIUM CHLORIDE 0.9 % IV SOLN
1.0000 ug/kg/h | INTRAVENOUS | Status: DC | PRN
  Filled 2011-09-05: qty 2.5

## 2011-09-05 MED ORDER — NALOXONE HCL 0.4 MG/ML IJ SOLN: 0.4000 mg | INTRAMUSCULAR | Status: DC | PRN

## 2011-09-05 MED ORDER — FERROUS SULFATE 325 (65 FE) MG PO TABS: 325.0000 mg | ORAL_TABLET | Freq: Two times a day (BID) | ORAL | Status: DC

## 2011-09-05 MED ORDER — SODIUM CHLORIDE 0.9 % IJ SOLN: 3.0000 mL | INTRAMUSCULAR | Status: DC | PRN

## 2011-09-05 MED ORDER — OXYCODONE-ACETAMINOPHEN 5-325 MG PO TABS: 1.0000 | ORAL_TABLET | ORAL | Status: AC | PRN

## 2011-09-05 MED ORDER — METOCLOPRAMIDE HCL 5 MG/ML IJ SOLN: 10.0000 mg | Freq: Three times a day (TID) | INTRAMUSCULAR | Status: DC | PRN

## 2011-09-05 MED ORDER — KETOROLAC TROMETHAMINE 30 MG/ML IJ SOLN: 30.0000 mg | Freq: Four times a day (QID) | INTRAMUSCULAR | Status: DC | PRN

## 2011-09-05 NOTE — Discharge Summary (Signed)
Obstetric Discharge Summary Reason for Admission: onset of labor and rupture of membranes Prenatal Procedures: NST and ultrasound Intrapartum Procedures: cesarean: low cervical, transverse Postpartum Procedures: Lovenox, removal of JP drain Complications-Operative and Postpartum: anemia HGB  Date Value Range Status  07/13/2011 7.2* 13.0-17.1 (g/dL) Final     Hemoglobin  Date Value Range Status  09/03/2011 8.4* 12.0-15.0 (g/dL) Final     HCT  Date Value Range Status  09/03/2011 26.9* 36.0-46.0 (%) Final  07/13/2011 23.0* 38.4-49.9 (%) Final   Hospital course:  Admitted on 09/02/11 with SROM, labor, with history of previous C/S and plan for repeat.  Dr. Su Hilt performed the repeat LTCS, with delivery of a viable female.  Patient had an uncomplicated recovery course, with chronic anemia as a finding, but no syncope or dizziness.  She was given Lovenox for VTE prophylaxis.  She was bottlefeeding, and declined any contraception.  Her JP drain was draining a small amount, and was removed on 09/05/11 without difficulty.  She was d/c'd home in stable condition.  Repeat CBC and CMP were done on 09/05/11, with stable values noted (Hgb 8.7).  Results for orders placed during the hospital encounter of 09/02/11 (from the past 24 hour(s))  CBC     Status: Abnormal   Collection Time   09/05/11 11:40 AM      Component Value Range   WBC 9.1  4.0 - 10.5 (K/uL)   RBC 3.79 (*) 3.87 - 5.11 (MIL/uL)   Hemoglobin 8.7 (*) 12.0 - 15.0 (g/dL)   HCT 96.0 (*) 45.4 - 46.0 (%)   MCV 73.4 (*) 78.0 - 100.0 (fL)   MCH 23.0 (*) 26.0 - 34.0 (pg)   MCHC 31.3  30.0 - 36.0 (g/dL)   Platelets 098  119 - 400 (K/uL)  COMPREHENSIVE METABOLIC PANEL     Status: Abnormal   Collection Time   09/05/11 11:40 AM      Component Value Range   Sodium 135  135 - 145 (mEq/L)   Potassium 4.1  3.5 - 5.1 (mEq/L)   Chloride 102  96 - 112 (mEq/L)   CO2 29  19 - 32 (mEq/L)   Glucose, Bld 68 (*) 70 - 99 (mg/dL)   BUN 7  6 - 23  (mg/dL)   Creatinine, Ser 1.47  0.50 - 1.10 (mg/dL)   Calcium 8.8  8.4 - 82.9 (mg/dL)   Total Protein 6.1  6.0 - 8.3 (g/dL)   Albumin 2.1 (*) 3.5 - 5.2 (g/dL)   AST 9  0 - 37 (U/L)   ALT 6  0 - 35 (U/L)   Alkaline Phosphatase 100  39 - 117 (U/L)   Total Bilirubin 0.2 (*) 0.3 - 1.2 (mg/dL)   GFR calc non Af Amer >90  >90 (mL/min)   GFR calc Af Amer >90  >90 (mL/min)     Discharge Diagnoses: Term Pregnancy-delivered                                         Anemia                                         Chronic HTN  Morbid obesity  Discharge Information: Date: 09/05/2011 Activity: Per [facility] handout Diet: routine Medications: Ibuprofen and Percocet Condition: stable Instructions: refer to practice specific booklet Discharge to: home Contraception:  Declines Follow-up Information    Follow up with [facility]. Make an appointment in 6 weeks.         Newborn Data: Live born female  Birth Weight: 7 lb 14.6 oz (3590 g) APGAR: 9, 9  Home with mother.  Lacey Whitaker 09/05/2011, 10:02 AM

## 2011-09-05 NOTE — Progress Notes (Signed)
Subjective: Postpartum Day 3: Cesarean Delivery Patient reports no issues.  Awaiting arrangements for car seat and cab voucher from SW.  Bottlefeeding.  Declines any contraception.  Up ad lib without syncope or dizziness.   Objective: Vital signs in last 24 hours: Temp:  [97.9 F (36.6 C)-98.8 F (37.1 C)] 97.9 F (36.6 C) (10/16 0541) Pulse Rate:  [69-87] 82  (10/16 0541) Resp:  [19-20] 20  (10/16 0541) BP: (120-135)/(78-83) 135/79 mmHg (10/16 0541) SpO2:  [96 %] 96 % (10/15 2238)  Physical Exam:  General: alert Lochia: appropriate Uterine Fundus: firm Incision: healing well, JP drain removed without difficulty. DVT Evaluation: No evidence of DVT seen on physical exam. Negative Homan's sign.   Basename 09/03/11 0544 09/02/11 1330  HGB 8.4* 9.5*  HCT 26.9* 30.7*    Assessment/Plan: Status post Cesarean section. Doing well postoperatively.  Discharge home with standard precautions and return to clinic in 4-6 weeks. Rx Motrin, Percocet. SW to arrange cab voucher and car seat availability. F/u in 6 weeks at [facility] or prn.  Lacey Whitaker 09/05/2011, 10:10 AM

## 2011-09-05 NOTE — Progress Notes (Signed)
Referred by: CN On: 09/05/11 For: PP depression  Patient Interview: X Family Interview Other:  PSYCHOSOCIAL DATA: Lives Alone Lives with: Mother and children  Admitted from Facility: Level of Care:  Primary Support (Name/Relationship): Lacey Whitaker, mother  Degree of support available: Involved  CURRENT CONCERNS: None noted  Substance Abuse Behavioral Health Issues: PP depression  Financial Resources Abuse/Neglect/Domestic Violence  Cultural/Religious Issues Post-Acute Placement  Adjustment to Illness Knowledge/Cognitive Deficit  Other ___________________________________________________________________  SOCIAL WORK ASSESSMENT/PLAN:  Pt acknowledges that she experienced PP depression symptoms in 2006 after the birth of her daughter. She remembers crying a lot. Pt was prescribed medication of which she took for 1 year. She denies any recent depression or hx of SI. Pt is aware of PP depression risk and will contact her medical provider if needed. Pt has good family support. Pt expressed need for a car seat. Sw referred to volunteer services. She has other supplies for the baby. Pt also expressed need for transportation home. Sw offered to provide the family with bus passes. Sw will assist further if needed.  No Further Intervention Required: X Psychosocial Support/Ongoing Assessment of Needs  Information/Referral to Community Resources  Other  PATIENT'S/FAMILY'S RESPONSE TO PLAN OF CARE:  Pt thanked Sw for consult and resources offered.  

## 2011-09-07 ENCOUNTER — Inpatient Hospital Stay (HOSPITAL_COMMUNITY): Admission: RE | Admit: 2011-09-07 | Payer: Self-pay | Source: Ambulatory Visit

## 2011-09-11 ENCOUNTER — Inpatient Hospital Stay (HOSPITAL_COMMUNITY)
Admission: RE | Admit: 2011-09-11 | Payer: Medicaid Other | Source: Ambulatory Visit | Admitting: Obstetrics and Gynecology

## 2011-09-11 ENCOUNTER — Encounter (HOSPITAL_COMMUNITY): Admission: RE | Payer: Self-pay | Source: Ambulatory Visit

## 2011-09-11 SURGERY — Surgical Case
Anesthesia: Regional

## 2011-09-12 ENCOUNTER — Encounter: Payer: Self-pay | Admitting: Oncology

## 2011-09-12 DIAGNOSIS — D500 Iron deficiency anemia secondary to blood loss (chronic): Secondary | ICD-10-CM | POA: Insufficient documentation

## 2011-09-15 ENCOUNTER — Encounter (INDEPENDENT_AMBULATORY_CARE_PROVIDER_SITE_OTHER): Payer: Self-pay | Admitting: Surgery

## 2011-09-22 ENCOUNTER — Encounter (INDEPENDENT_AMBULATORY_CARE_PROVIDER_SITE_OTHER): Payer: Medicaid Other | Admitting: Surgery

## 2011-09-29 ENCOUNTER — Ambulatory Visit: Payer: Self-pay | Admitting: Oncology

## 2011-09-29 ENCOUNTER — Other Ambulatory Visit: Payer: Self-pay | Admitting: Lab

## 2013-02-06 ENCOUNTER — Emergency Department (HOSPITAL_COMMUNITY)
Admission: EM | Admit: 2013-02-06 | Discharge: 2013-02-06 | Disposition: A | Discharge status: Home / Self Care | Payer: Self-pay | Attending: Emergency Medicine | Admitting: Emergency Medicine

## 2013-02-06 ENCOUNTER — Encounter (HOSPITAL_COMMUNITY): Payer: Self-pay

## 2013-02-06 ENCOUNTER — Emergency Department (HOSPITAL_COMMUNITY): Payer: Self-pay

## 2013-02-06 DIAGNOSIS — S0990XA Unspecified injury of head, initial encounter: Secondary | ICD-10-CM | POA: Insufficient documentation

## 2013-02-06 DIAGNOSIS — W1809XA Striking against other object with subsequent fall, initial encounter: Secondary | ICD-10-CM | POA: Insufficient documentation

## 2013-02-06 DIAGNOSIS — J45909 Unspecified asthma, uncomplicated: Secondary | ICD-10-CM | POA: Insufficient documentation

## 2013-02-06 DIAGNOSIS — W010XXA Fall on same level from slipping, tripping and stumbling without subsequent striking against object, initial encounter: Secondary | ICD-10-CM | POA: Insufficient documentation

## 2013-02-06 DIAGNOSIS — R209 Unspecified disturbances of skin sensation: Secondary | ICD-10-CM | POA: Insufficient documentation

## 2013-02-06 DIAGNOSIS — Z23 Encounter for immunization: Secondary | ICD-10-CM | POA: Insufficient documentation

## 2013-02-06 DIAGNOSIS — Y9389 Activity, other specified: Secondary | ICD-10-CM | POA: Insufficient documentation

## 2013-02-06 DIAGNOSIS — S0083XA Contusion of other part of head, initial encounter: Secondary | ICD-10-CM

## 2013-02-06 DIAGNOSIS — S0003XA Contusion of scalp, initial encounter: Secondary | ICD-10-CM | POA: Insufficient documentation

## 2013-02-06 DIAGNOSIS — S0180XA Unspecified open wound of other part of head, initial encounter: Secondary | ICD-10-CM | POA: Insufficient documentation

## 2013-02-06 DIAGNOSIS — R42 Dizziness and giddiness: Secondary | ICD-10-CM | POA: Insufficient documentation

## 2013-02-06 DIAGNOSIS — Y9289 Other specified places as the place of occurrence of the external cause: Secondary | ICD-10-CM | POA: Insufficient documentation

## 2013-02-06 MED ORDER — IBUPROFEN 800 MG PO TABS: 800.0000 mg | ORAL_TABLET | Freq: Three times a day (TID) | ORAL | Status: DC | PRN

## 2013-02-06 MED ORDER — TETANUS-DIPHTH-ACELL PERTUSSIS 5-2.5-18.5 LF-MCG/0.5 IM SUSP
0.5000 mL | Freq: Once | INTRAMUSCULAR | Status: AC
  Administered 2013-02-06: 0.5 mL via INTRAMUSCULAR
  Filled 2013-02-06: qty 0.5

## 2013-02-06 MED ORDER — OXYCODONE-ACETAMINOPHEN 5-325 MG PO TABS
1.0000 | ORAL_TABLET | Freq: Once | ORAL | Status: AC
  Administered 2013-02-06: 1 via ORAL
  Filled 2013-02-06: qty 1

## 2013-02-06 MED ORDER — HYDROCODONE-ACETAMINOPHEN 5-325 MG PO TABS: 1.0000 | ORAL_TABLET | ORAL | Status: DC | PRN

## 2013-02-06 NOTE — ED Provider Notes (Signed)
History     CSN: [id number]  Arrival date & time 02/06/13  1054   First MD Initiated Contact with Patient 02/06/13 1315      Chief Complaint  Patient presents with  . Fall  . Dizziness  . Numbness    (Consider location/radiation/quality/duration/timing/severity/associated sxs/prior treatment) HPI Comments: Patient reports she tripped and fell last night in her garage, striking her right face on the floor.  Reports she was dizzy and lightheaded at first but denies LOC.  This morning reports continued pain and swelling of the right face, worse with looking laterally.  Denies change in vision, jaw pain, malocclusion, nasal pain, difficulty breathing, focal neurological deficits.  Pt does note bilateral great toe "numbness" (decreased sensation) -but states this has been present for 5 months and is worse after standing for long periods of time at work.    Patient is a 27 y.o. female presenting with fall. The history is provided by the patient. The history is limited by the condition of the patient.  Fall Associated symptoms include headaches. Pertinent negatives include no numbness, no nausea and no vomiting.    Past Medical History  Diagnosis Date  . Asthma   . Iron deficiency anemia due to chronic blood loss   . Pregnancy induced hypertension     2006  . Cyst, mediastinum     pt. reports having the cyst removed last year and has reoccured  . Morbid obesity     s/p gastric bypass surgery in 2009    Past Surgical History  Procedure Laterality Date  . Gastric bypass  05/2008  . Cesarean section    . Gastric bypass  2009  . Wisdom tooth extraction  09/2010  . Bone cyst excision  2010  . Cesarean section  09/02/2011    Procedure: CESAREAN SECTION;  Surgeon: Purcell Nails, MD;  Location: [facility];  Service: Gynecology;  Laterality: N/A;    Family History  Problem Relation Age of Onset  . Asthma Mother   . Diabetes Mother   . Asthma Brother     History  Substance Use  Topics  . Smoking status: Never Smoker   . Smokeless tobacco: Not on file  . Alcohol Use: No     Comment: pregnant at time of appt    OB History   Grav Para Term Preterm Abortions TAB SAB Ect Mult Living   3 3 1       3       Review of Systems  HENT: Negative for neck pain and neck stiffness.   Eyes: Positive for pain. Negative for photophobia, discharge and visual disturbance.  Gastrointestinal: Negative for nausea and vomiting.  Musculoskeletal: Negative for back pain and gait problem.  Skin: Positive for wound. Negative for color change.  Neurological: Positive for dizziness, light-headedness and headaches. Negative for weakness and numbness.    Allergies  Review of patient's allergies indicates no known allergies.  Home Medications  No current outpatient prescriptions on file.  BP 160/96  Pulse 81  Temp(Src) 98.7 F (37.1 C) (Oral)  Resp 18  SpO2 99%  LMP 01/20/2013  Breastfeeding? No  Physical Exam  Nursing note and vitals reviewed. Constitutional: She appears well-developed and well-nourished. No distress.  HENT:  Head: Normocephalic. Head is with contusion.    Two superficial lacerations to right forehead.  Right eyebrow and right forehead with swelling, tenderness, ecchymosis.   Mandible in place, nontender.    Eyes: Conjunctivae are normal.  Pain in right eye  with lateral gaze. EOMs intact.  Neck: Neck supple.  Pulmonary/Chest: Effort normal.  Musculoskeletal: Normal range of motion.  Feet are flat.  Pulses, sensation intact.   Spine nontender without crepitus or stepoffs.    Neurological: She is alert. She has normal strength. No cranial nerve deficit or sensory deficit. She exhibits normal muscle tone. Coordination and gait normal. GCS eye subscore is 4. GCS verbal subscore is 5. GCS motor subscore is 6.  CN II-XII intact, EOMs intact, no pronator drift, grip strengths equal bilaterally; strength 5/5 in all extremities, sensation intact in all  extremities; finger to nose, heel to shin, rapid alternating movements normal; gait is normal.     Skin: She is not diaphoretic.    ED Course  Procedures (including critical care time)  Labs Reviewed - No data to display Ct Orbitss W/o Cm  02/06/2013  *RADIOLOGY REPORT*  Clinical Data: Larey Seat.  Right orbital pain and swelling.  CT ORBITS WITHOUT CONTRAST  Technique:  Multidetector CT imaging of the orbits was performed following the standard protocol without intravenous contrast.  Comparison: None  Findings: There is moderate soft tissue swelling/edema in the subcutaneous tissues surrounding the right orbit and extending back along the zygoma.  The bony structures are intact.  No acute orbital fracture.  The globes are intact.  The paranasal sinuses and mastoid air cells are clear.  The visualized portion of the brain is unremarkable.  IMPRESSION: Superficial soft tissue injury but no acute fracture.   Original Report Authenticated By: Rudie Meyer, M.D.      1. Facial contusion, initial encounter     MDM  Pt with mechanical fall last night resulting in right facial ecchymosis/abrasion/hematoma.  CT shows no fracture.  Neurologically intact.  Doubt intracranial injury.  No spinal tenderness. No LOC or amnesia, though pt did have some lightheadedness following event.  Possible mild concussion.  Bilateral toe paresthesia is chronic and appears to be associated with standing for long periods of time - pt is obese and has flat feet, currently wearing shoes without support.  Likely this combination of factors is causing her symptoms - discussed footwear and possible podiatry follow up.  Sensation of the toes is intact, capillary refill is normal, movement is normal.  Discussed all results with patient.  Encouraged PCP follow up.  Pt given return precautions.  Pt verbalizes understanding and agrees with plan.           Trixie Dredge, PA-C 02/06/13 1538

## 2013-02-06 NOTE — ED Notes (Signed)
Emily West, PA at bedside.  

## 2013-02-06 NOTE — ED Notes (Signed)
Pt alert and mentating appropriately upon d/c teaching; pt given d/c teaching, prescriptions and follow up care instructions; pt has been educated on important warning sings and need for return to ED at anytime; pt verbalizes understanding and has no further questions upon d/c teaching given; NAD noted upon d/c. Pt instructed not to drive and states she has called a ride who should be in lobby waiting for her. Pt given ice pack for head upon d/c. Pt leaving with d/c teaching and prescriptions.

## 2013-02-06 NOTE — ED Notes (Signed)
Pt presents with fall last night.  Pt reports tripping over object in garage, striking face on cement.  Pt denies any LOC, reports dizziness.  Swelling noted to R orbit and R eyelid. Pt reports having numbness to bilateral big toes is worse since falling last night.

## 2013-02-06 NOTE — ED Provider Notes (Signed)
Medical screening examination/treatment/procedure(s) were performed by non-physician practitioner and as supervising physician I was immediately available for consultation/collaboration.  Doug Sou, MD 02/06/13 [phone]

## 2013-02-06 NOTE — ED Notes (Signed)
Pt c/o pain and dizziness after fall in garage last night; pt states she fell forward onto ground; pt denies n/v/d; pt denies fever/chills; pt c/o chronic lower back pain; pt denies neck pain after fall; pt denies change in vision; pt alert and mentating appropriately; NAD noted at this time

## 2013-07-31 ENCOUNTER — Encounter: Payer: Self-pay | Admitting: Physician Assistant

## 2013-07-31 ENCOUNTER — Ambulatory Visit (INDEPENDENT_AMBULATORY_CARE_PROVIDER_SITE_OTHER): Payer: Medicaid Other | Admitting: Physician Assistant

## 2013-07-31 VITALS — BP 134/76 | HR 76 | Temp 98.0°F | Resp 18 | Ht 65.5 in | Wt 317.0 lb

## 2013-07-31 DIAGNOSIS — J45909 Unspecified asthma, uncomplicated: Secondary | ICD-10-CM | POA: Insufficient documentation

## 2013-07-31 DIAGNOSIS — O139 Gestational [pregnancy-induced] hypertension without significant proteinuria, unspecified trimester: Secondary | ICD-10-CM | POA: Insufficient documentation

## 2013-07-31 DIAGNOSIS — M545 Low back pain, unspecified: Secondary | ICD-10-CM

## 2013-07-31 DIAGNOSIS — M25572 Pain in left ankle and joints of left foot: Secondary | ICD-10-CM

## 2013-07-31 DIAGNOSIS — Z0000 Encounter for general adult medical examination without abnormal findings: Secondary | ICD-10-CM

## 2013-07-31 DIAGNOSIS — D500 Iron deficiency anemia secondary to blood loss (chronic): Secondary | ICD-10-CM

## 2013-07-31 DIAGNOSIS — M25579 Pain in unspecified ankle and joints of unspecified foot: Secondary | ICD-10-CM

## 2013-07-31 MED ORDER — ALBUTEROL SULFATE HFA 108 (90 BASE) MCG/ACT IN AERS: 2.0000 | INHALATION_SPRAY | Freq: Four times a day (QID) | RESPIRATORY_TRACT | Status: DC | PRN

## 2013-07-31 NOTE — Progress Notes (Signed)
Patient ID: Lacey Whitaker MRN: [id number], DOB: September 17, 1986, 27 y.o. Date of Encounter: 07/31/2013,   Chief Complaint: Physical (CPE)  HPI: 27 y.o. y/o severely obese AA female  here for CPE.   She states that after her last baby, who is now 74 years old, she had insurance issues and child support issues etc. She says that she just got her Medicaid back 2 weeks ago. Therefore she has had no medical care at all since she had her last child. She's had no type of medical evaluation including no gynecologic evaluation.  Her only complaint today is that she does mention that she's been having some pain in her left ankle after standing for hours at work. She also says at that visit we'll then swells some. She doesn't understand what the cause of this is. He says that this has been occurring for over a year off and on. She also complains of low back pain. She says that she was quite diagnosed with " chronic back pain"  in the past and also says that she has been prescribed medicines for this in the past and is requesting some medicine to use for this.  No other complaints today.   Review of Systems: Consitutional: No fever, chills, fatigue, night sweats, lymphadenopathy. No significant/unexplained weight changes. Eyes: No visual changes, eye redness, or discharge. ENT/Mouth: No ear pain, sore throat, nasal drainage, or sinus pain. Cardiovascular: No chest pressure,heaviness, tightness or squeezing, even with exertion. No increased shortness of breath or dyspnea on exertion.No palpitations, edema, orthopnea, PND. Respiratory: No cough, hemoptysis, SOB, or wheezing. Gastrointestinal: No anorexia, dysphagia, reflux, pain, nausea, vomiting, hematemesis, diarrhea, constipation, BRBPR, or melena. Breast: No mass, nodules, bulging, or retraction. No skin changes or inflammation. No nipple discharge. No lymphadenopathy. Genitourinary: No dysuria, hematuria, incontinence, vaginal discharge, pruritis,  burning, abnormal bleeding, or pain. Musculoskeletal: No decreased ROM, No joint pain or swelling. No significant pain in neck.  Skin: No rash, pruritis, or concerning lesions. Neurological: No headache, dizziness, syncope, seizures, tremors, memory loss, coordination problems, or paresthesias. Psychological: No anxiety, depression, hallucinations, SI/HI. Endocrine: No polydipsia, polyphagia, polyuria, or known diabetes.No increased fatigue. No palpitations/rapid heart rate. No significant/unexplained weight change. All other systems were reviewed and are otherwise negative.  Past Medical History  Diagnosis Date  . Iron deficiency anemia due to chronic blood loss   . Cyst, mediastinum     pt. reports having the cyst removed last year and has reoccured  . Morbid obesity     s/p gastric bypass surgery in 2009  . Asthma   . Pregnancy induced hypertension     2006     Past Surgical History  Procedure Laterality Date  . Gastric bypass  05/2008  . Cesarean section    . Gastric bypass  2009  . Wisdom tooth extraction  09/2010  . Bone cyst excision  2010  . Cesarean section  09/02/2011    Procedure: CESAREAN SECTION;  Surgeon: Purcell Nails, MD;  Location: [facility];  Service: Gynecology;  Laterality: N/A;    Home Meds:  No current outpatient prescriptions on file prior to visit.   No current facility-administered medications on file prior to visit.    Allergies: No Known Allergies  History   Social History  . Marital Status: Single    Spouse Name: N/A    Number of Children: N/A  . Years of Education: N/A   Occupational History  . Not on file.   Social History Main Topics  .  Smoking status: Current Some Day Smoker    Types: Cigarettes  . Smokeless tobacco: Never Used  . Alcohol Use: 1.5 oz/week    3 drink(s) per week     Comment: pregnant at time of appt  . Drug Use: No  . Sexual Activity: No   Other Topics Concern  . Not on file   Social History Narrative    Works at [employer].   3 children: Ages 29,6,2 y/o    Lives with her Mom, her brother, and her 3 children.        Family History  Problem Relation Age of Onset  . Asthma Mother   . Asthma Brother     Physical Exam: Blood pressure 134/76, pulse 76, temperature 98 F (36.7 C), temperature source Oral, resp. rate 18, height 5' 5.5" (1.664 m), weight 317 lb (143.79 kg), last menstrual period 07/27/2013, not currently breastfeeding., Body mass index is 51.93 kg/(m^2). General: Severely obese AAF, in no acute distress. HEENT: Normocephalic, atraumatic. Conjunctiva pink, sclera non-icteric. Pupils 2 mm constricting to 1 mm, round, regular, and equally reactive to light and accomodation. EOMI. Internal auditory canal clear. TMs with good cone of light and without pathology. Nasal mucosa pink. Nares are without discharge. No sinus tenderness. Oral mucosa pink.  Pharynx without exudate.   Neck: Supple. Trachea midline. No thyromegaly. Full ROM. No lymphadenopathy.No Carotid Bruits. Lungs: Clear to auscultation bilaterally without wheezes, rales, or rhonchi. Breathing is of normal effort and unlabored. Cardiovascular: RRR with S1 S2. No murmurs, rubs, or gallops. Distal pulses 2+ symmetrically. No carotid or abdominal bruits. Breast: Symmetrical. No masses. Nipples without discharge. Abdomen: Soft, non-tender, non-distended with normoactive bowel sounds. No hepatosplenomegaly or masses. No rebound/guarding. No CVA tenderness. No hernias.  Genitourinary: Pelvic exam is deferred today as patient is on her menses. She is going to return to clinic to have this done when off menses. Musculoskeletal: Full range of motion and 5/5 strength throughout. Without swelling, atrophy, tenderness, crepitus, or warmth. Extremities without clubbing, cyanosis, or edema. Calves supple. Skin: Warm and moist without erythema, ecchymosis, wounds, or rash. Neuro: A+Ox3. CN II-XII grossly intact. Moves all extremities  spontaneously. Full sensation throughout. Normal gait. DTR 2+ throughout upper and lower extremities. Finger to nose intact. Psych:  Responds to questions appropriately with a normal affect.   Assessment/Plan:  27 y.o. y/o female here for CPE 1. Visit for preventive health examination A. Screening Labs:  B. Pelvic Exam/Pap Smear (I do not have result of last pap-presumably normal) She is not fasting today. She will schedule her next office visit in the next one to 2 weeks to do her Pelvic Exam and come fasting so we can do that fasting labs and Pelvic Exam at the same time.  2. Asthma, chronic She says that she only has problems with her asthma if she gets a respiratory infection or if she does a lot of walking or exertion. - albuterol (PROVENTIL HFA;VENTOLIN HFA) 108 (90 BASE) MCG/ACT inhaler; Inhale 2 puffs into the lungs every 6 (six) hours as needed for wheezing.  Dispense: 1 Inhaler; Refill: 0  3. Obesities, morbid She states that in the past she was close to 500 pounds prior to her gastric bypass surgery. She says that she did lose down to about 200 pounds after that. She says that it's been with her pregnancies that she has gained back this weight has become this weight  4. H/O Iron deficiency anemia due to chronic blood loss We'll  recheck this when we do the labs as above under #1.  5. Low back pain I discussed with her that this is secondary to muscle strain. This is secondary to her obesity. I tried to explain to her that there is a lot of strain on her back Ustick area around her weight and especially in her abdominal area. She continues to request medication for the pain. I deferred this. Told her she can use over-the-counter Motrin if needed. Discussed that she really needs weight loss to treat this.  6. Left ankle pain Discussed that this too is secondary to her obesity. Also secondary to decreased muscle tone and support in this area. I gave her handout with exercises to do to  strengthen her ankle. Again encouraged weight loss.  7. discuss with her that she is on no contraceptive. He reports that she is not sexually active and does not anticipate being sexually active in any time soon. Discussed that if this changes to definitely follow up and get contraceptive. She defers to start any at this time.  [address] [place], [place], [facility] 07/31/2013 12:56 PM

## 2013-08-20 ENCOUNTER — Other Ambulatory Visit: Payer: Medicaid Other | Admitting: Physician Assistant

## 2014-01-06 ENCOUNTER — Emergency Department (HOSPITAL_COMMUNITY)
Admission: EM | Admit: 2014-01-06 | Discharge: 2014-01-06 | Disposition: A | Discharge status: Home / Self Care | Payer: Medicaid Other | Attending: Emergency Medicine | Admitting: Emergency Medicine

## 2014-01-06 ENCOUNTER — Encounter (HOSPITAL_COMMUNITY): Payer: Self-pay | Admitting: Emergency Medicine

## 2014-01-06 DIAGNOSIS — Z862 Personal history of diseases of the blood and blood-forming organs and certain disorders involving the immune mechanism: Secondary | ICD-10-CM | POA: Insufficient documentation

## 2014-01-06 DIAGNOSIS — J9859 Other diseases of mediastinum, not elsewhere classified: Secondary | ICD-10-CM | POA: Insufficient documentation

## 2014-01-06 DIAGNOSIS — B379 Candidiasis, unspecified: Secondary | ICD-10-CM

## 2014-01-06 DIAGNOSIS — B3731 Acute candidiasis of vulva and vagina: Secondary | ICD-10-CM | POA: Insufficient documentation

## 2014-01-06 DIAGNOSIS — Z3202 Encounter for pregnancy test, result negative: Secondary | ICD-10-CM | POA: Insufficient documentation

## 2014-01-06 DIAGNOSIS — Z8679 Personal history of other diseases of the circulatory system: Secondary | ICD-10-CM | POA: Insufficient documentation

## 2014-01-06 DIAGNOSIS — J45909 Unspecified asthma, uncomplicated: Secondary | ICD-10-CM | POA: Insufficient documentation

## 2014-01-06 DIAGNOSIS — B373 Candidiasis of vulva and vagina: Secondary | ICD-10-CM | POA: Insufficient documentation

## 2014-01-06 DIAGNOSIS — F17200 Nicotine dependence, unspecified, uncomplicated: Secondary | ICD-10-CM | POA: Insufficient documentation

## 2014-01-06 LAB — URINALYSIS, ROUTINE W REFLEX MICROSCOPIC
Bilirubin Urine: NEGATIVE
GLUCOSE, UA: NEGATIVE mg/dL
Hgb urine dipstick: NEGATIVE
KETONES UR: NEGATIVE mg/dL
Nitrite: NEGATIVE
PROTEIN: NEGATIVE mg/dL
Specific Gravity, Urine: 1.018 (ref 1.005–1.030)
UROBILINOGEN UA: 2 mg/dL — AB (ref 0.0–1.0)
pH: 6.5 (ref 5.0–8.0)

## 2014-01-06 LAB — URINE MICROSCOPIC-ADD ON

## 2014-01-06 LAB — WET PREP, GENITAL
Clue Cells Wet Prep HPF POC: NONE SEEN
TRICH WET PREP: NONE SEEN

## 2014-01-06 LAB — POCT PREGNANCY, URINE: PREG TEST UR: NEGATIVE

## 2014-01-06 MED ORDER — FLUCONAZOLE 200 MG PO TABS: 200.0000 mg | ORAL_TABLET | Freq: Every day | ORAL | Status: AC

## 2014-01-06 NOTE — ED Notes (Signed)
Patient is alert and orientedx4.  Patient was explained discharge instructions and they understood them with no questions.   

## 2014-01-06 NOTE — Discharge Instructions (Signed)
Take the prescribed medication as directed.  May continue using topical meds as needed for comfort. Follow-up with your primary care physician if problems occur. Return to the ED for new or worsening symptoms.

## 2014-01-06 NOTE — ED Notes (Signed)
PA at bedside.

## 2014-01-06 NOTE — ED Provider Notes (Signed)
CSN: [id number]     Arrival date & time 01/06/14  1856 History   First MD Initiated Contact with Patient 01/06/14 1956     Chief Complaint  Patient presents with  . Vaginal Itching   (Consider location/radiation/quality/duration/timing/severity/associated sxs/prior Treatment) The history is provided by the patient and medical records.   This is a 28 y.o. F with PMH significant for Fe+ deficiency anemia, obesity s/p gastric bypass surgery, presenting to the ED for vaginal itching for the past several weeks.  States she thought it was a yeast infection as she has had these in the past with similar sx.  Pt used OTC medications including vagisil cream with some improvement but not complete resolution of sx.  Pt has had no new sexual partners (last encounter was august 2014) and she has no concern for STD at this time.  No fevers or chills.  No nausea, vomiting, diarrhea, or abdominal pain.  VS stable on arrival.  Past Medical History  Diagnosis Date  . Iron deficiency anemia due to chronic blood loss   . Cyst, mediastinum     pt. reports having the cyst removed last year and has reoccured  . Morbid obesity     s/p gastric bypass surgery in 2009  . Asthma   . Pregnancy induced hypertension     2006   Past Surgical History  Procedure Laterality Date  . Gastric bypass  05/2008  . Cesarean section    . Gastric bypass  2009  . Wisdom tooth extraction  09/2010  . Bone cyst excision  2010  . Cesarean section  09/02/2011    Procedure: CESAREAN SECTION;  Surgeon: Purcell NailsAngela Y Roberts, MD;  Location: [facility];  Service: Gynecology;  Laterality: N/A;   Family History  Problem Relation Age of Onset  . Asthma Mother   . Asthma Brother    History  Substance Use Topics  . Smoking status: Current Some Day Smoker    Types: Cigarettes  . Smokeless tobacco: Never Used  . Alcohol Use: 1.5 oz/week    3 drink(s) per week     Comment: pregnant at time of appt   OB History   Grav Para Term Preterm  Abortions TAB SAB Ect Mult Living   3 3 1       3      Review of Systems  Genitourinary: Positive for vaginal pain (itching).  All other systems reviewed and are negative.   Allergies  Review of patient's allergies indicates no known allergies.  Home Medications   Current Outpatient Rx  Name  Route  Sig  Dispense  Refill  . albuterol (PROVENTIL HFA;VENTOLIN HFA) 108 (90 BASE) MCG/ACT inhaler   Inhalation   Inhale 2 puffs into the lungs every 6 (six) hours as needed for wheezing.   1 Inhaler   0   . ibuprofen (ADVIL,MOTRIN) 200 MG tablet   Oral   Take 200 mg by mouth every 6 (six) hours as needed for fever, headache, moderate pain or cramping.          BP 178/86  Pulse 86  Temp(Src) 97.8 F (36.6 C) (Oral)  Resp 18  SpO2 100%  Physical Exam  Nursing note and vitals reviewed. Constitutional: She is oriented to person, place, and time. She appears well-developed and well-nourished.  Morbidly obese  HENT:  Head: Normocephalic and atraumatic.  Mouth/Throat: Oropharynx is clear and moist.  Eyes: Conjunctivae and EOM are normal. Pupils are equal, round, and reactive to light.  Neck:  Normal range of motion.  Cardiovascular: Normal rate, regular rhythm and normal heart sounds.   Pulmonary/Chest: Effort normal and breath sounds normal. No respiratory distress. She has no wheezes.  Abdominal: Soft. Bowel sounds are normal. There is no tenderness. There is no guarding.  Genitourinary: There is no tenderness or lesion on the right labia. There is no tenderness or lesion on the left labia. Cervix exhibits no motion tenderness. Right adnexum displays no tenderness. Left adnexum displays no tenderness. Vaginal discharge found.  Normal female external genitalia; thick, white, curd-like vaginal discharge present in vault consistent with yeast infection; no CMT or adnexal tenderness  Musculoskeletal: Normal range of motion.  Neurological: She is alert and oriented to person, place, and  time.  Skin: Skin is warm and dry.  Psychiatric: She has a normal mood and affect.    ED Course  Procedures (including critical care time) Labs Review Labs Reviewed  URINALYSIS, ROUTINE W REFLEX MICROSCOPIC - Abnormal; Notable for the following:    Urobilinogen, UA 2.0 (*)    Leukocytes, UA TRACE (*)    All other components within normal limits  URINE MICROSCOPIC-ADD ON - Abnormal; Notable for the following:    Squamous Epithelial / LPF FEW (*)    All other components within normal limits  GC/CHLAMYDIA PROBE AMP  WET PREP, GENITAL  POCT PREGNANCY, URINE   Imaging Review No results found.  EKG Interpretation   None       MDM   Final diagnoses:  Yeast infection   Sx and PE findings consistent with yeast infection, wet prep confirms.  Gc/Chl pending.  Pt will be discharged with diflucan.  Advised to FU PCP if problems occur.  Discussed plan with pt, she acknowledged understanding and agrees with plan of care.  Garlon Hatchet, PA-C 01/06/14 2159

## 2014-01-06 NOTE — ED Notes (Signed)
Pt c/o vaginal itching and irritation x several weeks that improved with OTC meds but has returned; pt sts LMP was end of Jan

## 2014-01-07 LAB — GC/CHLAMYDIA PROBE AMP
CT Probe RNA: NEGATIVE
GC Probe RNA: NEGATIVE

## 2014-01-12 NOTE — ED Provider Notes (Signed)
Medical screening examination/treatment/procedure(s) were performed by non-physician practitioner and as supervising physician I was immediately available for consultation/collaboration.  EKG Interpretation   None         Enid SkeensJoshua M Alka Falwell, MD 01/12/14 [phone]

## 2014-03-10 ENCOUNTER — Ambulatory Visit: Payer: Medicaid Other | Admitting: Family Medicine

## 2014-03-17 ENCOUNTER — Ambulatory Visit (INDEPENDENT_AMBULATORY_CARE_PROVIDER_SITE_OTHER): Payer: Medicaid Other | Admitting: Family Medicine

## 2014-03-17 ENCOUNTER — Encounter: Payer: Self-pay | Admitting: Family Medicine

## 2014-03-17 ENCOUNTER — Other Ambulatory Visit: Payer: Self-pay | Admitting: Family Medicine

## 2014-03-17 VITALS — BP 166/92 | HR 80 | Temp 97.5°F | Resp 18 | Ht 67.0 in | Wt 326.0 lb

## 2014-03-17 DIAGNOSIS — I10 Essential (primary) hypertension: Secondary | ICD-10-CM

## 2014-03-17 LAB — CBC WITH DIFFERENTIAL/PLATELET
Basophils Absolute: 0.1 10*3/uL (ref 0.0–0.1)
Basophils Relative: 1 % (ref 0–1)
Eosinophils Absolute: 0.3 10*3/uL (ref 0.0–0.7)
Eosinophils Relative: 6 % — ABNORMAL HIGH (ref 0–5)
HCT: 28 % — ABNORMAL LOW (ref 36.0–46.0)
HEMOGLOBIN: 7.9 g/dL — AB (ref 12.0–15.0)
LYMPHS ABS: 1.5 10*3/uL (ref 0.7–4.0)
Lymphocytes Relative: 26 % (ref 12–46)
MCH: 17.5 pg — ABNORMAL LOW (ref 26.0–34.0)
MCHC: 28.2 g/dL — ABNORMAL LOW (ref 30.0–36.0)
MCV: 61.9 fL — ABNORMAL LOW (ref 78.0–100.0)
MONOS PCT: 9 % (ref 3–12)
Monocytes Absolute: 0.5 10*3/uL (ref 0.1–1.0)
NEUTROS ABS: 3.2 10*3/uL (ref 1.7–7.7)
NEUTROS PCT: 58 % (ref 43–77)
Platelets: 296 10*3/uL (ref 150–400)
RBC: 4.52 MIL/uL (ref 3.87–5.11)
RDW: 20.1 % — ABNORMAL HIGH (ref 11.5–15.5)
WBC: 5.6 10*3/uL (ref 4.0–10.5)

## 2014-03-17 LAB — COMPLETE METABOLIC PANEL WITH GFR
ALT: 17 U/L (ref 0–35)
AST: 20 U/L (ref 0–37)
Albumin: 4 g/dL (ref 3.5–5.2)
Alkaline Phosphatase: 76 U/L (ref 39–117)
BILIRUBIN TOTAL: 0.4 mg/dL (ref 0.2–1.2)
BUN: 9 mg/dL (ref 6–23)
CO2: 25 meq/L (ref 19–32)
CREATININE: 0.89 mg/dL (ref 0.50–1.10)
Calcium: 8.6 mg/dL (ref 8.4–10.5)
Chloride: 105 mEq/L (ref 96–112)
GFR, EST NON AFRICAN AMERICAN: 88 mL/min
GLUCOSE: 77 mg/dL (ref 70–99)
Potassium: 4.3 mEq/L (ref 3.5–5.3)
Sodium: 139 mEq/L (ref 135–145)
Total Protein: 6.8 g/dL (ref 6.0–8.3)

## 2014-03-17 NOTE — Progress Notes (Signed)
Subjective:    Patient ID: Lacey Whitaker, female    DOB: [birth year], 28 y.o.   MRN: [id number]  HPI Patient has a history of a gastric bypass. She originally weighed greater than 400 pounds. She is currently 326 pounds. She is interested in possibly having a tummy tuck. However, on examination today, the patient does not have excessive skin but rather has obesity and abdominal fat tissue.  Therefore I do not believe a simple tummy tuck would address her issues. She needs further weight loss. Unfortunately her blood pressure is extremely elevated today at 166/92. The patient has been taking an over-the-counter weight loss supplement which has high levels of caffeine in it. She also has a past medical history of pregnancy-induced hypertension. She denies any chest pain shortness of breath or dyspnea on exertion. Previously her blood pressures been controlled at office visit here in September. Past Medical History  Diagnosis Date  . Iron deficiency anemia due to chronic blood loss   . Cyst, mediastinum     pt. reports having the cyst removed last year and has reoccured  . Morbid obesity     s/p gastric bypass surgery in 2009  . Asthma   . Pregnancy induced hypertension     2006   Past Surgical History  Procedure Laterality Date  . Gastric bypass  05/2008  . Cesarean section    . Gastric bypass  2009  . Wisdom tooth extraction  09/2010  . Bone cyst excision  2010  . Cesarean section  09/02/2011    Procedure: CESAREAN SECTION;  Surgeon: Purcell NailsAngela Y Roberts, MD;  Location: [facility];  Service: Gynecology;  Laterality: N/A;   No current outpatient prescriptions on file prior to visit.   No current facility-administered medications on file prior to visit.   No Known Allergies History   Social History  . Marital Status: Single    Spouse Name: N/A    Number of Children: N/A  . Years of Education: N/A   Occupational History  . Not on file.   Social History Main Topics  . Smoking status:  Current Some Day Smoker    Types: Cigars  . Smokeless tobacco: Never Used  . Alcohol Use: 1.5 oz/week    3 drink(s) per week     Comment: pregnant at time of appt  . Drug Use: No  . Sexual Activity: No   Other Topics Concern  . Not on file   Social History Narrative   Works at [employer]   [place].   3 children: Ages 208,6,2 y/o    Lives with her Mom, her brother, and her 3 children.          Review of Systems  All other systems reviewed and are negative.      Objective:   Physical Exam  Vitals reviewed. Constitutional: She appears well-developed and well-nourished.  Neck: No JVD present. No thyromegaly present.  Cardiovascular: Normal rate.   Murmur heard. Pulmonary/Chest: Effort normal and breath sounds normal. No respiratory distress. She has no wheezes. She has no rales.  Abdominal: Soft. Bowel sounds are normal. She exhibits no distension. There is no tenderness. There is no rebound and no guarding.  Musculoskeletal: She exhibits no edema.  Lymphadenopathy:    She has no cervical adenopathy.   patient has a 1/6 systolic ejection murmur on exam today that radiates into both carotid arteries.  She is morbidly obese.        Assessment & Plan:  1. HTN (hypertension)  I explained to the patient that I do not feel she would benefit from a simple tummy tuck. Rather she needs to address her obesity with further weight loss. I would like to discuss medications that could help her with weight loss, but I am concerned about her blood pressure first. I recommended that she stop the over-the-counter weight loss supplement she is taking. I want her to check her blood pressure several times over the next week and recheck here in one week. If her blood pressure still elevated, at that time I will start the patient on medication for hypertension. Also will check a CMP and a CBC to evaluate for her anemia as well as any kidney or liver problems or other signs of diabetes. The patient  return fasting for a fasting lipid panel. Recheck in 2 weeks. - COMPLETE METABOLIC PANEL WITH GFR - CBC with Differential

## 2014-03-20 ENCOUNTER — Telehealth: Payer: Self-pay | Admitting: Family Medicine

## 2014-03-20 ENCOUNTER — Encounter: Payer: Self-pay | Admitting: Family Medicine

## 2014-03-20 DIAGNOSIS — D649 Anemia, unspecified: Secondary | ICD-10-CM

## 2014-03-20 LAB — IRON: IRON: 19 ug/dL — AB (ref 42–145)

## 2014-03-20 LAB — VITAMIN B12: VITAMIN B 12: 316 pg/mL (ref 211–911)

## 2014-03-20 MED ORDER — FERROUS SULFATE 325 (65 FE) MG PO TABS: 325.0000 mg | ORAL_TABLET | Freq: Two times a day (BID) | ORAL | Status: DC

## 2014-03-20 NOTE — Telephone Encounter (Signed)
Tried to call patient about lab results.  Invalid phone number.  Sent letter with lab results and provider recommendations.  Lab notified of add ons.  Rx for Iron supplement to pharmacy.

## 2014-03-20 NOTE — Telephone Encounter (Signed)
Message copied by Donne AnonPLUMMER, KIM M on Fri Mar 20, 2014 12:02 PM ------      Message from: Lynnea FerrierPICKARD, WARREN      Created: Tue Mar 17, 2014  5:02 PM       Patient is very anemic.  Add iron level and b12 level to labs.  Start feso4 325 bid. ------

## 2014-03-26 ENCOUNTER — Encounter: Payer: Self-pay | Admitting: *Deleted

## 2014-03-31 ENCOUNTER — Telehealth: Payer: Self-pay | Admitting: Family Medicine

## 2014-03-31 ENCOUNTER — Encounter: Payer: Self-pay | Admitting: Family Medicine

## 2014-03-31 ENCOUNTER — Ambulatory Visit (INDEPENDENT_AMBULATORY_CARE_PROVIDER_SITE_OTHER): Payer: Medicaid Other | Admitting: Family Medicine

## 2014-03-31 VITALS — BP 162/110 | HR 80 | Temp 97.9°F | Resp 16 | Ht 67.0 in | Wt 323.0 lb

## 2014-03-31 DIAGNOSIS — I10 Essential (primary) hypertension: Secondary | ICD-10-CM

## 2014-03-31 DIAGNOSIS — D649 Anemia, unspecified: Secondary | ICD-10-CM

## 2014-03-31 MED ORDER — LOSARTAN POTASSIUM-HCTZ 100-25 MG PO TABS: 1.0000 | ORAL_TABLET | Freq: Every day | ORAL | Status: DC

## 2014-03-31 MED ORDER — LISINOPRIL-HYDROCHLOROTHIAZIDE 20-12.5 MG PO TABS: 1.0000 | ORAL_TABLET | Freq: Every day | ORAL | Status: DC

## 2014-03-31 NOTE — Telephone Encounter (Signed)
Message copied by Ricard DillonWILLIS, SANDY B on Tue Mar 31, 2014  4:00 PM ------      Message from: Malvin JohnsBULLINS, SUSAN S      Created: Tue Mar 31, 2014  2:49 PM       PATIENT IS CALLING TO SAY THAT SHE WENT TO PHARMACY TODAY AND THEY WILL NOT FILL HER BLOOD PRESSURE MEDICATION WITHOUT PRIOR AUTHORIZATION PLEASE CALL HER AT [phone] ------

## 2014-03-31 NOTE — Progress Notes (Signed)
 Subjective:    Patient ID: Lacey Whitaker, female    DOB: 12/05/1985, 28 y.o.   MRN: [id number]  HPI 03/17/14 Patient has a history of a gastric bypass. She originally weighed greater than 400 pounds. She is currently 326 pounds. She is interested in possibly having a tummy tuck. However, on examination today, the patient does not have excessive skin but rather has obesity and abdominal fat tissue.  Therefore I do not believe a simple tummy tuck would address her issues. She needs further weight loss. Unfortunately her blood pressure is extremely elevated today at 166/92. The patient has been taking an over-the-counter weight loss supplement which has high levels of caffeine in it. She also has a past medical history of pregnancy-induced hypertension. She denies any chest pain shortness of breath or dyspnea on exertion. Previously her blood pressures been controlled at office visit here in September.  At that time, my plan was: 1. HTN (hypertension) I explained to the patient that I do not feel she would benefit from a simple tummy tuck. Rather she needs to address her obesity with further weight loss. I would like to discuss medications that could help her with weight loss, but I am concerned about her blood pressure first. I recommended that she stop the over-the-counter weight loss supplement she is taking. I want her to check her blood pressure several times over the next week and recheck here in one week. If her blood pressure still elevated, at that time I will start the patient on medication for hypertension. Also will check a CMP and a CBC to evaluate for her anemia as well as any kidney or liver problems or other signs of diabetes. The patient return fasting for a fasting lipid panel. Recheck in 2 weeks. - COMPLETE METABOLIC PANEL WITH GFR - CBC with Differential  03/31/14 Office Visit on 03/17/2014  Component Date Value Ref Range Status  . Sodium 03/17/2014 139  135 - 145 mEq/L Final  .  Potassium 03/17/2014 4.3  3.5 - 5.3 mEq/L Final  . Chloride 03/17/2014 105  96 - 112 mEq/L Final  . CO2 03/17/2014 25  19 - 32 mEq/L Final  . Glucose, Bld 03/17/2014 77  70 - 99 mg/dL Final  . BUN 03/17/2014 9  6 - 23 mg/dL Final  . Creat 03/17/2014 0.89  0.50 - 1.10 mg/dL Final  . Total Bilirubin 03/17/2014 0.4  0.2 - 1.2 mg/dL Final  . Alkaline Phosphatase 03/17/2014 76  39 - 117 U/L Final  . AST 03/17/2014 20  0 - 37 U/L Final  . ALT 03/17/2014 17  0 - 35 U/L Final  . Total Protein 03/17/2014 6.8  6.0 - 8.3 g/dL Final  . Albumin 03/17/2014 4.0  3.5 - 5.2 g/dL Final  . Calcium 03/17/2014 8.6  8.4 - 10.5 mg/dL Final  . GFR, Est African American 03/17/2014 >89   Final  . GFR, Est Non African American 03/17/2014 88   Final   Comment:                            The estimated GFR is a calculation valid for adults (>=18 years old)                          that uses the CKD-EPI algorithm to adjust for age and sex. It is                              not to be used for children, pregnant women, hospitalized patients,                             patients on dialysis, or with rapidly changing kidney function.                          According to the NKDEP, eGFR >89 is normal, 60-89 shows mild                          impairment, 30-59 shows moderate impairment, 15-29 shows severe                          impairment and <15 is ESRD.                             . WBC 03/17/2014 5.6  4.0 - 10.5 K/uL Final  . RBC 03/17/2014 4.52  3.87 - 5.11 MIL/uL Final  . Hemoglobin 03/17/2014 7.9* 12.0 - 15.0 g/dL Final  . HCT 03/17/2014 28.0* 36.0 - 46.0 % Final  . MCV 03/17/2014 61.9* 78.0 - 100.0 fL Final  . MCH 03/17/2014 17.5* 26.0 - 34.0 pg Final  . MCHC 03/17/2014 28.2* 30.0 - 36.0 g/dL Final  . RDW 03/17/2014 20.1* 11.5 - 15.5 % Final  . Platelets 03/17/2014 296  150 - 400 K/uL Final  . Neutrophils Relative % 03/17/2014 58  43 - 77 % Final  . Neutro Abs 03/17/2014 3.2  1.7 - 7.7 K/uL Final  .  Lymphocytes Relative 03/17/2014 26  12 - 46 % Final  . Lymphs Abs 03/17/2014 1.5  0.7 - 4.0 K/uL Final  . Monocytes Relative 03/17/2014 9  3 - 12 % Final  . Monocytes Absolute 03/17/2014 0.5  0.1 - 1.0 K/uL Final  . Eosinophils Relative 03/17/2014 6* 0 - 5 % Final  . Eosinophils Absolute 03/17/2014 0.3  0.0 - 0.7 K/uL Final  . Basophils Relative 03/17/2014 1  0 - 1 % Final  . Basophils Absolute 03/17/2014 0.1  0.0 - 0.1 K/uL Final  . Smear Review 03/17/2014 Criteria for review not met   Final  Orders Only on 03/17/2014  Component Date Value Ref Range Status  . Iron 03/17/2014 19* 42 - 145 ug/dL Final  . Vitamin B-12 03/17/2014 316  211 - 911 pg/mL Final   Patient was found to have severe iron deficiency anemia. Unfortunately we were unable to reach the patient because her phone had been disconnected. We met her a letter that she did not receive. She is currently not on any therapy for her anemia.  She is currently not taking birth control. She reports her monthly menstrual cycles which are extremely heavy. She is not interested in that toe due to the weight gain. She is not a candidate for birth control pills because of her blood pressure. She is interested in weight loss medication Past Medical History  Diagnosis Date  . Iron deficiency anemia due to chronic blood loss   . Cyst, mediastinum     pt. reports having the cyst removed last year and has reoccured  . Morbid obesity     s/p gastric bypass surgery in 2009  . Asthma   . Pregnancy induced hypertension     2006   Past Surgical History    Procedure Laterality Date  . Gastric bypass  05/2008  . Cesarean section    . Gastric bypass  2009  . Wisdom tooth extraction  09/2010  . Bone cyst excision  2010  . Cesarean section  09/02/2011    Procedure: CESAREAN SECTION;  Surgeon: Angela Y Roberts, MD;  Location: [facility];  Service: Gynecology;  Laterality: N/A;   No current outpatient prescriptions on file prior to visit.   No current  facility-administered medications on file prior to visit.   No Known Allergies History   Social History  . Marital Status: Single    Spouse Name: N/A    Number of Children: N/A  . Years of Education: N/A   Occupational History  . Not on file.   Social History Main Topics  . Smoking status: Current Some Day Smoker    Types: Cigars  . Smokeless tobacco: Never Used  . Alcohol Use: 1.5 oz/week    3 drink(s) per week     Comment: pregnant at time of appt  . Drug Use: No  . Sexual Activity: No   Other Topics Concern  . Not on file   Social History Narrative   Works at [employer]   [place].   3 children: Ages 8,6,2 y/o    Lives with her Mom, her brother, and her 3 children.          Review of Systems  All other systems reviewed and are negative.      Objective:   Physical Exam  Vitals reviewed. Constitutional: She appears well-developed and well-nourished.  Neck: No JVD present. No thyromegaly present.  Cardiovascular: Normal rate.   Murmur heard. Pulmonary/Chest: Effort normal and breath sounds normal. No respiratory distress. She has no wheezes. She has no rales.  Abdominal: Soft. Bowel sounds are normal. She exhibits no distension. There is no tenderness. There is no rebound and no guarding.  Musculoskeletal: She exhibits no edema.  Lymphadenopathy:    She has no cervical adenopathy.   patient has a 1/6 systolic ejection murmur on exam today that radiates into both carotid arteries.  She is morbidly obese.        Assessment & Plan:  1. HTN (hypertension) Begin Hyzaar 100/25 one by mouth daily and recheck blood pressure in one month.  I would not start any weight loss medications at the present time until we get her blood pressure and her knee under better control. - losartan-hydrochlorothiazide (HYZAAR) 100-25 MG per tablet; Take 1 tablet by mouth daily.  Dispense: 90 tablet; Refill: 3  2. Anemia 10 iron sulfate 325 mg by mouth twice a day. Recheck CBC in  one month. For the patient to GYN because of she would benefit from an IUD  to help regulate  her menstrual cycle - Ambulatory referral to Gynecology   

## 2014-03-31 NOTE — Telephone Encounter (Signed)
Per [facility] pt must try lisinopril first - Per Dr. Tanya NonesPickard change to lisinopril/hctz 20/12.5mg  1 po qd - rx sent to pharm and pt aware

## 2014-05-01 ENCOUNTER — Ambulatory Visit: Payer: Medicaid Other | Admitting: Family Medicine

## 2014-05-06 ENCOUNTER — Encounter: Payer: Self-pay | Admitting: *Deleted

## 2014-05-15 ENCOUNTER — Ambulatory Visit (INDEPENDENT_AMBULATORY_CARE_PROVIDER_SITE_OTHER): Payer: Medicaid Other | Admitting: Family Medicine

## 2014-05-15 ENCOUNTER — Encounter: Payer: Self-pay | Admitting: Family Medicine

## 2014-05-15 VITALS — BP 144/72 | HR 96 | Temp 98.1°F | Resp 18 | Ht 67.0 in | Wt 314.0 lb

## 2014-05-15 DIAGNOSIS — I10 Essential (primary) hypertension: Secondary | ICD-10-CM

## 2014-05-15 DIAGNOSIS — M25572 Pain in left ankle and joints of left foot: Secondary | ICD-10-CM

## 2014-05-15 DIAGNOSIS — M25579 Pain in unspecified ankle and joints of unspecified foot: Secondary | ICD-10-CM

## 2014-05-15 DIAGNOSIS — D509 Iron deficiency anemia, unspecified: Secondary | ICD-10-CM

## 2014-05-15 MED ORDER — FERROUS SULFATE DRIED 200 (65 FE) MG PO TABS: 1.0000 | ORAL_TABLET | Freq: Two times a day (BID) | ORAL | Status: DC

## 2014-05-15 NOTE — Progress Notes (Signed)
Subjective:    Patient ID: Lacey Whitaker, female    DOB: December 10, 1985, 28 y.o.   MRN: [id number]  HPI 03/17/14 Patient has a history of a gastric bypass. She originally weighed greater than 400 pounds. She is currently 326 pounds. She is interested in possibly having a tummy tuck. However, on examination today, the patient does not have excessive skin but rather has obesity and abdominal fat tissue.  Therefore I do not believe a simple tummy tuck would address her issues. She needs further weight loss. Unfortunately her blood pressure is extremely elevated today at 166/92. The patient has been taking an over-the-counter weight loss supplement which has high levels of caffeine in it. She also has a past medical history of pregnancy-induced hypertension. She denies any chest pain shortness of breath or dyspnea on exertion. Previously her blood pressures been controlled at office visit here in September.  At that time, my plan was: 1. HTN (hypertension) I explained to the patient that I do not feel she would benefit from a simple tummy tuck. Rather she needs to address her obesity with further weight loss. I would like to discuss medications that could help her with weight loss, but I am concerned about her blood pressure first. I recommended that she stop the over-the-counter weight loss supplement she is taking. I want her to check her blood pressure several times over the next week and recheck here in one week. If her blood pressure still elevated, at that time I will start the patient on medication for hypertension. Also will check a CMP and a CBC to evaluate for her anemia as well as any kidney or liver problems or other signs of diabetes. The patient return fasting for a fasting lipid panel. Recheck in 2 weeks. - COMPLETE METABOLIC PANEL WITH GFR - CBC with Differential  03/31/14 No visits with results within 1 Month(s) from this visit. Latest known visit with results is:  Office Visit on  03/17/2014  Component Date Value Ref Range Status  . Sodium 03/17/2014 139  135 - 145 mEq/L Final  . Potassium 03/17/2014 4.3  3.5 - 5.3 mEq/L Final  . Chloride 03/17/2014 105  96 - 112 mEq/L Final  . CO2 03/17/2014 25  19 - 32 mEq/L Final  . Glucose, Bld 03/17/2014 77  70 - 99 mg/dL Final  . BUN 03/17/2014 9  6 - 23 mg/dL Final  . Creat 03/17/2014 0.89  0.50 - 1.10 mg/dL Final  . Total Bilirubin 03/17/2014 0.4  0.2 - 1.2 mg/dL Final  . Alkaline Phosphatase 03/17/2014 76  39 - 117 U/L Final  . AST 03/17/2014 20  0 - 37 U/L Final  . ALT 03/17/2014 17  0 - 35 U/L Final  . Total Protein 03/17/2014 6.8  6.0 - 8.3 g/dL Final  . Albumin 03/17/2014 4.0  3.5 - 5.2 g/dL Final  . Calcium 03/17/2014 8.6  8.4 - 10.5 mg/dL Final  . GFR, Est African American 03/17/2014 >89   Final  . GFR, Est Non African American 03/17/2014 88   Final   Comment:                            The estimated GFR is a calculation valid for adults (>=75 years old)                          that uses the CKD-EPI algorithm to adjust  for age and sex. It is                            not to be used for children, pregnant women, hospitalized patients,                             patients on dialysis, or with rapidly changing kidney function.                          According to the NKDEP, eGFR >89 is normal, 60-89 shows mild                          impairment, 30-59 shows moderate impairment, 15-29 shows severe                          impairment and <15 is ESRD.                             . WBC 03/17/2014 5.6  4.0 - 10.5 K/uL Final  . RBC 03/17/2014 4.52  3.87 - 5.11 MIL/uL Final  . Hemoglobin 03/17/2014 7.9* 12.0 - 15.0 g/dL Final  . HCT 03/17/2014 28.0* 36.0 - 46.0 % Final  . MCV 03/17/2014 61.9* 78.0 - 100.0 fL Final  . MCH 03/17/2014 17.5* 26.0 - 34.0 pg Final  . MCHC 03/17/2014 28.2* 30.0 - 36.0 g/dL Final  . RDW 03/17/2014 20.1* 11.5 - 15.5 % Final  . Platelets 03/17/2014 296  150 - 400 K/uL Final  . Neutrophils  Relative % 03/17/2014 58  43 - 77 % Final  . Neutro Abs 03/17/2014 3.2  1.7 - 7.7 K/uL Final  . Lymphocytes Relative 03/17/2014 26  12 - 46 % Final  . Lymphs Abs 03/17/2014 1.5  0.7 - 4.0 K/uL Final  . Monocytes Relative 03/17/2014 9  3 - 12 % Final  . Monocytes Absolute 03/17/2014 0.5  0.1 - 1.0 K/uL Final  . Eosinophils Relative 03/17/2014 6* 0 - 5 % Final  . Eosinophils Absolute 03/17/2014 0.3  0.0 - 0.7 K/uL Final  . Basophils Relative 03/17/2014 1  0 - 1 % Final  . Basophils Absolute 03/17/2014 0.1  0.0 - 0.1 K/uL Final  . Smear Review 03/17/2014 Criteria for review not met   Final   Patient was found to have severe iron deficiency anemia. Unfortunately we were unable to reach the patient because her phone had been disconnected. We met her a letter that she did not receive. She is currently not on any therapy for her anemia.  She is currently not taking birth control. She reports her monthly menstrual cycles which are extremely heavy. She is not interested in that toe due to the weight gain. She is not a candidate for birth control pills because of her blood pressure. She is interested in weight loss medication.  At that time, my plan was: 1. HTN (hypertension) Begin Hyzaar 100/25 one by mouth daily and recheck blood pressure in one month.  I would not start any weight loss medications at the present time until we get her blood pressure and her knee under better control. - losartan-hydrochlorothiazide (HYZAAR) 100-25 MG per tablet; Take 1 tablet by mouth daily.  Dispense: 90 tablet; Refill: 3  2. Anemia  10 iron sulfate 325 mg by mouth twice a day. Recheck CBC in one month. For the patient to GYN because of she would benefit from an IUD  to help regulate  her menstrual cycle - Ambulatory referral to Gynecology  05/15/14 Patient never took the iron pills. Differential cannot recheck her CBC today as I would not anticipate any improvement. Chilling her blood pressure is much better at 140 with  72. She denies any chest pain shortness of breath or dyspnea on exertion. Regarding her obesity she is trying to change her diet. She was last 12 pounds since our initial encounter. She is interested in medication for weight loss. She has an appointment to see the gynecologist in July to discuss birth control options including IUD to help control the menorrhagia which is contributing to her iron deficiency anemia. She also has a concern regarding daily left ankle pain. The pain is primarily located posterior to the left lateral malleolus and anterior portion of the mortise of ankle joint.  The pain is constant and burning in nature. It is worse after prolonged standing or walking. Past Medical History  Diagnosis Date  . Iron deficiency anemia due to chronic blood loss   . Cyst, mediastinum     pt. reports having the cyst removed last year and has reoccured  . Morbid obesity     s/p gastric bypass surgery in 2009  . Asthma   . Pregnancy induced hypertension     2006   Past Surgical History  Procedure Laterality Date  . Gastric bypass  05/2008  . Cesarean section    . Gastric bypass  2009  . Wisdom tooth extraction  09/2010  . Bone cyst excision  2010  . Cesarean section  09/02/2011    Procedure: CESAREAN SECTION;  Surgeon: Delice Lesch, MD;  Location: [facility];  Service: Gynecology;  Laterality: N/A;   Current Outpatient Prescriptions on File Prior to Visit  Medication Sig Dispense Refill  . lisinopril-hydrochlorothiazide (PRINZIDE,ZESTORETIC) 20-12.5 MG per tablet Take 1 tablet by mouth daily.  30 tablet  5   No current facility-administered medications on file prior to visit.   No Known Allergies History   Social History  . Marital Status: Single    Spouse Name: N/A    Number of Children: N/A  . Years of Education: N/A   Occupational History  . Not on file.   Social History Main Topics  . Smoking status: Current Some Day Smoker    Types: Cigars  . Smokeless tobacco: Never  Used  . Alcohol Use: 1.5 oz/week    3 drink(s) per week     Comment: pregnant at time of appt  . Drug Use: No  . Sexual Activity: No   Other Topics Concern  . Not on file   Social History Narrative   Works at [employer].   3 children: Ages 84,6,2 y/o    Lives with her Mom, her brother, and her 3 children.          Review of Systems  All other systems reviewed and are negative.      Objective:   Physical Exam  Vitals reviewed. Constitutional: She appears well-developed and well-nourished.  Neck: No JVD present. No thyromegaly present.  Cardiovascular: Normal rate.   Murmur heard. Pulmonary/Chest: Effort normal and breath sounds normal. No respiratory distress. She has no wheezes. She has no rales.  Abdominal: Soft. Bowel sounds are normal. She exhibits no distension. There is no  tenderness. There is no rebound and no guarding.  Musculoskeletal: She exhibits no edema.       Left ankle: She exhibits swelling. She exhibits normal range of motion, no ecchymosis, no deformity and no laceration. No lateral malleolus, no medial malleolus, no AITFL, no CF ligament, no posterior TFL, no head of 5th metatarsal and no proximal fibula tenderness found. Achilles tendon normal.  Lymphadenopathy:    She has no cervical adenopathy.   patient has a 1/6 systolic ejection murmur on exam today that radiates into both carotid arteries.  She is morbidly obese.        Assessment & Plan:   1. Anemia, iron deficiency Begin Feosol 1 tablet by mouth twice a day. Gynecology as planned. The patient can start an IUD to help control her menorrhagia. Recheck CBC in one month on iron. - Ferrous Sulfate Dried (FEOSOL) 200 (65 FE) MG TABS; Take 1 tablet by mouth 2 (two) times daily.  Dispense: 60 tablet; Refill: 5  2. 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 Begin by obtaining an x-ray of the left ankle. - DG Ankle Complete Left; Future  3.  Essential hypertension Blood pressure is much better. Continue her current medication is present does. I am hoping that we can further reduce her blood pressure with weight loss.  4. Morbid obesity Discussed contrave.  The patient will read about the medicine, consider it, and call me if she desires to try the med.

## 2014-05-20 ENCOUNTER — Ambulatory Visit
Admission: RE | Admit: 2014-05-20 | Discharge: 2014-05-20 | Disposition: A | Discharge status: Home / Self Care | Payer: Medicaid Other | Source: Ambulatory Visit | Attending: Family Medicine | Admitting: Family Medicine

## 2014-05-20 DIAGNOSIS — M25572 Pain in left ankle and joints of left foot: Secondary | ICD-10-CM

## 2014-05-27 ENCOUNTER — Ambulatory Visit (INDEPENDENT_AMBULATORY_CARE_PROVIDER_SITE_OTHER): Payer: Medicaid Other | Admitting: Obstetrics & Gynecology

## 2014-05-27 ENCOUNTER — Encounter: Payer: Self-pay | Admitting: Obstetrics & Gynecology

## 2014-05-27 ENCOUNTER — Other Ambulatory Visit: Payer: Self-pay | Admitting: Family Medicine

## 2014-05-27 VITALS — BP 127/73 | HR 91 | Temp 98.0°F | Resp 20 | Ht 66.5 in | Wt 313.3 lb

## 2014-05-27 DIAGNOSIS — D509 Iron deficiency anemia, unspecified: Secondary | ICD-10-CM

## 2014-05-27 DIAGNOSIS — Z30430 Encounter for insertion of intrauterine contraceptive device: Secondary | ICD-10-CM

## 2014-05-27 DIAGNOSIS — Z01812 Encounter for preprocedural laboratory examination: Secondary | ICD-10-CM

## 2014-05-27 LAB — POCT PREGNANCY, URINE: PREG TEST UR: NEGATIVE

## 2014-05-27 MED ORDER — FERROUS SULFATE DRIED 200 (65 FE) MG PO TABS: 1.0000 | ORAL_TABLET | Freq: Two times a day (BID) | ORAL | Status: DC

## 2014-05-27 MED ORDER — LEVONORGESTREL 20 MCG/24HR IU IUD
INTRAUTERINE_SYSTEM | Freq: Once | INTRAUTERINE | Status: AC
  Administered 2014-05-27: 1 via INTRAUTERINE

## 2014-05-27 NOTE — Progress Notes (Signed)
    GYNECOLOGY CLINIC PROCEDURE NOTE  [name] is a 28 y.o. [id number] here for Mirena IUD insertion. No GYN concerns.  Last pap smear was two years ago and was normal.  IUD Insertion Procedure Note Patient identified, informed consent performed.  Discussed risks of irregular bleeding, cramping, infection, malpositioning or misplacement of the IUD outside the uterus which may require further procedure such as laparoscopy. Time out was performed.  Urine pregnancy test negative.  Speculum placed in the vagina.  Cervix visualized.  Cleaned with Betadine x 2.  Grasped anteriorly with a single tooth tenaculum.  Uterus sounded to 9 cm.  Mirena IUD placed per manufacturer's recommendations.  Strings trimmed to 3 cm. Tenaculum was removed, good hemostasis noted.  Patient tolerated procedure well.   Patient was given post-procedure instructions.  She was advised to be have backup contraception for one week.  Patient was also asked to check IUD strings periodically and follow up in 4 weeks for IUD check and annual exam.    Jaynie CollinsUGONNA  Shepherd Finnan, MD, FACOG Attending Obstetrician & Gynecologist Center [facility], [facility]

## 2014-05-27 NOTE — Progress Notes (Signed)
Pt states she was referred to our office due to anemia and needs birth control - believes she is to have IUD insertion. She last had unprotected intercourse on 05/10/14. UPT = negative.  She has not taken iron supplement since May.

## 2014-05-27 NOTE — Patient Instructions (Signed)
Return to clinic for any scheduled appointments or for any gynecologic concerns as needed.   

## 2014-05-28 ENCOUNTER — Encounter: Payer: Self-pay | Admitting: *Deleted

## 2014-06-15 ENCOUNTER — Ambulatory Visit (INDEPENDENT_AMBULATORY_CARE_PROVIDER_SITE_OTHER): Payer: Medicaid Other | Admitting: Family Medicine

## 2014-06-15 ENCOUNTER — Encounter: Payer: Self-pay | Admitting: Family Medicine

## 2014-06-15 VITALS — BP 144/82 | HR 86 | Temp 97.3°F | Resp 18 | Ht 67.0 in | Wt 314.0 lb

## 2014-06-15 DIAGNOSIS — M25572 Pain in left ankle and joints of left foot: Secondary | ICD-10-CM

## 2014-06-15 DIAGNOSIS — M25579 Pain in unspecified ankle and joints of unspecified foot: Secondary | ICD-10-CM

## 2014-06-15 DIAGNOSIS — D509 Iron deficiency anemia, unspecified: Secondary | ICD-10-CM

## 2014-06-15 LAB — CBC WITH DIFFERENTIAL/PLATELET
Basophils Absolute: 0.1 10*3/uL (ref 0.0–0.1)
Basophils Relative: 2 % — ABNORMAL HIGH (ref 0–1)
EOS ABS: 0.3 10*3/uL (ref 0.0–0.7)
Eosinophils Relative: 6 % — ABNORMAL HIGH (ref 0–5)
HCT: 28.4 % — ABNORMAL LOW (ref 36.0–46.0)
Hemoglobin: 8.5 g/dL — CL (ref 12.0–15.0)
LYMPHS ABS: 1.9 10*3/uL (ref 0.7–4.0)
LYMPHS PCT: 34 % (ref 12–46)
MCH: 19.1 pg — AB (ref 26.0–34.0)
MCHC: 29.9 g/dL — ABNORMAL LOW (ref 30.0–36.0)
MCV: 64 fL — ABNORMAL LOW (ref 78.0–100.0)
Monocytes Absolute: 0.5 10*3/uL (ref 0.1–1.0)
Monocytes Relative: 8 % (ref 3–12)
NEUTROS PCT: 50 % (ref 43–77)
Neutro Abs: 2.9 10*3/uL (ref 1.7–7.7)
PLATELETS: 412 10*3/uL — AB (ref 150–400)
RBC: 4.44 MIL/uL (ref 3.87–5.11)
RDW: 22.1 % — ABNORMAL HIGH (ref 11.5–15.5)
WBC: 5.7 10*3/uL (ref 4.0–10.5)

## 2014-06-15 MED ORDER — NALTREXONE-BUPROPION HCL ER 8-90 MG PO TB12: 2.0000 | ORAL_TABLET | Freq: Two times a day (BID) | ORAL | Status: DC

## 2014-06-15 NOTE — Progress Notes (Signed)
Subjective:    Patient ID: Lacey Whitaker, female    DOB: 1986-01-19, 28 y.o.   MRN: [id number]  HPI 03/17/14 Patient has a history of a gastric bypass. She originally weighed greater than 400 pounds. She is currently 326 pounds. She is interested in possibly having a tummy tuck. However, on examination today, the patient does not have excessive skin but rather has obesity and abdominal fat tissue.  Therefore I do not believe a simple tummy tuck would address her issues. She needs further weight loss. Unfortunately her blood pressure is extremely elevated today at 166/92. The patient has been taking an over-the-counter weight loss supplement which has high levels of caffeine in it. She also has a past medical history of pregnancy-induced hypertension. She denies any chest pain shortness of breath or dyspnea on exertion. Previously her blood pressures been controlled at office visit here in September.  At that time, my plan was: 1. HTN (hypertension) I explained to the patient that I do not feel she would benefit from a simple tummy tuck. Rather she needs to address her obesity with further weight loss. I would like to discuss medications that could help her with weight loss, but I am concerned about her blood pressure first. I recommended that she stop the over-the-counter weight loss supplement she is taking. I want her to check her blood pressure several times over the next week and recheck here in one week. If her blood pressure still elevated, at that time I will start the patient on medication for hypertension. Also will check a CMP and a CBC to evaluate for her anemia as well as any kidney or liver problems or other signs of diabetes. The patient return fasting for a fasting lipid panel. Recheck in 2 weeks. - COMPLETE METABOLIC PANEL WITH GFR - CBC with Differential  03/31/14 Office Visit on 05/27/2014  Component Date Value Ref Range Status  . Preg Test, Ur 05/27/2014 NEGATIVE  NEGATIVE Final     Comment:                                 THE SENSITIVITY OF THIS                          METHODOLOGY IS >24 mIU/mL   Patient was found to have severe iron deficiency anemia. Unfortunately we were unable to reach the patient because her phone had been disconnected. We met her a letter that she did not receive. She is currently not on any therapy for her anemia.  She is currently not taking birth control. She reports her monthly menstrual cycles which are extremely heavy. She is not interested in that toe due to the weight gain. She is not a candidate for birth control pills because of her blood pressure. She is interested in weight loss medication.  At that time, my plan was: 1. HTN (hypertension) Begin Hyzaar 100/25 one by mouth daily and recheck blood pressure in one month.  I would not start any weight loss medications at the present time until we get her blood pressure and her knee under better control. - losartan-hydrochlorothiazide (HYZAAR) 100-25 MG per tablet; Take 1 tablet by mouth daily.  Dispense: 90 tablet; Refill: 3  2. Anemia 10 iron sulfate 325 mg by mouth twice a day. Recheck CBC in one month. For the patient to GYN because of she would benefit from  an IUD  to help regulate  her menstrual cycle - Ambulatory referral to Gynecology  05/15/14 Patient never took the iron pills. Differential cannot recheck her CBC today as I would not anticipate any improvement. Chilling her blood pressure is much better at 140 with 72. She denies any chest pain shortness of breath or dyspnea on exertion. Regarding her obesity she is trying to change her diet. She was last 12 pounds since our initial encounter. She is interested in medication for weight loss. She has an appointment to see the gynecologist in July to discuss birth control options including IUD to help control the menorrhagia which is contributing to her iron deficiency anemia. She also has a concern regarding daily left ankle pain. The pain  is primarily located posterior to the left lateral malleolus and anterior portion of the mortise of ankle joint.  The pain is constant and burning in nature. It is worse after prolonged standing or walking.  At that time, my plan was: 1. Anemia, iron deficiency Begin Feosol 1 tablet by mouth twice a day. Gynecology as planned. The patient can start an IUD to help control her menorrhagia. Recheck CBC in one month on iron. - Ferrous Sulfate Dried (FEOSOL) 200 (65 FE) MG TABS; Take 1 tablet by mouth 2 (two) times daily.  Dispense: 60 tablet; Refill: 5  2. 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 Begin by obtaining an x-ray of the left ankle. - DG Ankle Complete Left; Future  3. Essential hypertension Blood pressure is much better. Continue her current medication is present does. I am hoping that we can further reduce her blood pressure with weight loss.  4. Morbid obesity Discussed contrave.  The patient will read about the medicine, consider it, and call me if she desires to try the med.  06/15/14 Patient is here today to recheck her CBC. She is still not taking iron. Thankfully she has had her IUD placed. Unfortunately she is having irregular vaginal bleeding for the last week since having the IUD. Her gynecologist anticipates that this will stop after one to 2 months. Unfortunately she's been unable to afford the iron. She denies any syncope or presyncope. She denies any chest pain or shortness of breath or dizziness. Her blood pressure remained stable at 144/82 her weight is stable at 314 although she has not lost any further weight. She continues to complain of pain in her left ankle the left lateral malleolus. She describes the pain as a burning stinging pain. It is exacerbated by prolonged standing. Past Medical History  Diagnosis Date  . Iron deficiency anemia due to chronic blood loss   . Cyst, mediastinum     pt. reports having the cyst  removed last year and has reoccured  . Morbid obesity     s/p gastric bypass surgery in 2009  . Asthma   . Pregnancy induced hypertension     2006   Past Surgical History  Procedure Laterality Date  . Gastric bypass  05/2008  . Cesarean section    . Gastric bypass  2009  . Wisdom tooth extraction  09/2010  . Bone cyst excision  2010  . Cesarean section  09/02/2011    Procedure: CESAREAN SECTION;  Surgeon: Delice Lesch, MD;  Location: [facility];  Service: Gynecology;  Laterality: N/A;   Current Outpatient Prescriptions on File Prior to Visit  Medication Sig Dispense Refill  . lisinopril-hydrochlorothiazide (PRINZIDE,ZESTORETIC) 20-12.5 MG per tablet  Take 1 tablet by mouth daily.  30 tablet  5  . Ferrous Sulfate Dried (FEOSOL) 200 (65 FE) MG TABS Take 1 tablet by mouth 2 (two) times daily.  60 tablet  5   No current facility-administered medications on file prior to visit.   No Known Allergies History   Social History  . Marital Status: Single    Spouse Name: N/A    Number of Children: N/A  . Years of Education: N/A   Occupational History  . Not on file.   Social History Main Topics  . Smoking status: Current Some Day Smoker    Types: Cigars  . Smokeless tobacco: Never Used  . Alcohol Use: 1.5 oz/week    3 drink(s) per week     Comment: pregnant at time of appt  . Drug Use: No  . Sexual Activity: Yes    Birth Control/ Protection: None   Other Topics Concern  . Not on file   Social History Narrative   Works at [employer].   3 children: Ages 5,6,2 y/o    Lives with her Mom, her brother, and her 3 children.          Review of Systems  All other systems reviewed and are negative.      Objective:   Physical Exam  Vitals reviewed. Constitutional: She appears well-developed and well-nourished.  Neck: No JVD present. No thyromegaly present.  Cardiovascular: Normal rate.   Murmur heard. Pulmonary/Chest: Effort normal and breath sounds normal. No  respiratory distress. She has no wheezes. She has no rales.  Abdominal: Soft. Bowel sounds are normal. She exhibits no distension. There is no tenderness. There is no rebound and no guarding.  Musculoskeletal: She exhibits no edema.       Left ankle: She exhibits swelling. She exhibits normal range of motion, no ecchymosis, no deformity and no laceration. No lateral malleolus, no medial malleolus, no AITFL, no CF ligament, no posterior TFL, no head of 5th metatarsal and no proximal fibula tenderness found. Achilles tendon normal.  Lymphadenopathy:    She has no cervical adenopathy.   patient has a 1/6 systolic ejection murmur on exam today that radiates into both carotid arteries.  She is morbidly obese.        Assessment & Plan:  1. Anemia, iron deficiency I recheck a CBC today. Patient cannot tolerate oral iron, we may need to consider a heme consult for possible IV iron.  - CBC with Differential  2.  morbid obesity-begin contrave 1 poqam and gradually increase by 1 pill each week until she is on 2 pill po bid.  3.  Ankle pain- x-ray is benign. I believe the patient likely had a neuropathic pain around the left ankle and possible tendinitis exacerbated by her obesity. I have recommended that she take at least a 30 minute break at work to break up 8 hour shift. Hopefully by decreasing the amount of time she has to stand uninterrupted, the pain in her left ankle will improve. If not I recommend orthopedic consult for possible cortisone injection.

## 2014-07-09 ENCOUNTER — Ambulatory Visit: Payer: Medicaid Other | Admitting: Obstetrics & Gynecology

## 2014-07-09 ENCOUNTER — Encounter: Payer: Self-pay | Admitting: Obstetrics & Gynecology

## 2014-08-10 ENCOUNTER — Ambulatory Visit: Payer: Medicaid Other | Admitting: Family Medicine

## 2014-08-20 ENCOUNTER — Ambulatory Visit (INDEPENDENT_AMBULATORY_CARE_PROVIDER_SITE_OTHER): Payer: Medicaid Other | Admitting: Family Medicine

## 2014-08-20 ENCOUNTER — Encounter: Payer: Self-pay | Admitting: Family Medicine

## 2014-08-20 VITALS — BP 116/60 | HR 88 | Temp 98.2°F | Resp 18 | Ht 67.0 in | Wt 318.0 lb

## 2014-08-20 DIAGNOSIS — K921 Melena: Secondary | ICD-10-CM

## 2014-08-20 DIAGNOSIS — G8929 Other chronic pain: Secondary | ICD-10-CM

## 2014-08-20 DIAGNOSIS — M25572 Pain in left ankle and joints of left foot: Secondary | ICD-10-CM

## 2014-08-20 DIAGNOSIS — L309 Dermatitis, unspecified: Secondary | ICD-10-CM

## 2014-08-20 MED ORDER — TRIAMCINOLONE ACETONIDE 0.5 % EX OINT: 1.0000 "application " | TOPICAL_OINTMENT | Freq: Two times a day (BID) | CUTANEOUS | Status: DC

## 2014-08-20 MED ORDER — HYDROCORTISONE ACETATE 25 MG RE SUPP: 25.0000 mg | Freq: Two times a day (BID) | RECTAL | Status: DC

## 2014-08-20 MED ORDER — NALTREXONE-BUPROPION HCL ER 8-90 MG PO TB12: 2.0000 | ORAL_TABLET | Freq: Two times a day (BID) | ORAL | Status: DC

## 2014-08-20 NOTE — Progress Notes (Signed)
Subjective:    Patient ID: Lacey Whitaker, female    DOB: 1986/06/19, 28 y.o.   MRN: [id number]  HPI  Patient has a history of eczema. She is presently having an eczema exacerbation on her face and her cheeks. Characterized by papulosquamous rash mainly on her cheeks and her chin. There is no erythema or warmth. She is tried cocoa butter and other moisturizers without benefit. In the past she has used triamcinolone with great success.  She also is having occasional bright red blood per rectum. It is a small amount. She denies any heavy bleeding. She denies any pain with defecation. She denies any history of external hemorrhoids. She denies any lumps around her rectum. The bleeding is painless. She refuses a rectal exam today because she is on her period.  She also continues to have chronic pain in her left ankle. I obtained an x-ray in July which was negative for any signs of arthritis or degeneration. Patient is tried and failed anti-inflammatory drugs. Attributed the ankle pain to obesity.  Patient is actively trying to lose weight. However the pain in her ankle is becoming worse. She is now reporting numbness and tingling radiating from her ankle to her big toe. Past Medical History  Diagnosis Date  . Iron deficiency anemia due to chronic blood loss   . Cyst, mediastinum     pt. reports having the cyst removed last year and has reoccured  . Morbid obesity     s/p gastric bypass surgery in 2009  . Asthma   . Pregnancy induced hypertension     2006   Past Surgical History  Procedure Laterality Date  . Gastric bypass  05/2008  . Cesarean section    . Gastric bypass  2009  . Wisdom tooth extraction  09/2010  . Bone cyst excision  2010  . Cesarean section  09/02/2011    Procedure: CESAREAN SECTION;  Surgeon: Purcell NailsAngela Y Roberts, MD;  Location: [facility];  Service: Gynecology;  Laterality: N/A;   Current Outpatient Prescriptions on File Prior to Visit  Medication Sig Dispense Refill  .  Ferrous Sulfate Dried (FEOSOL) 200 (65 FE) MG TABS Take 1 tablet by mouth 2 (two) times daily.  60 tablet  5  . levonorgestrel (MIRENA) 20 MCG/24HR IUD 1 each by Intrauterine route once.      Marland Kitchen. lisinopril-hydrochlorothiazide (PRINZIDE,ZESTORETIC) 20-12.5 MG per tablet Take 1 tablet by mouth daily.  30 tablet  5   No current facility-administered medications on file prior to visit.   No Known Allergies History   Social History  . Marital Status: Single    Spouse Name: N/A    Number of Children: N/A  . Years of Education: N/A   Occupational History  . Not on file.   Social History Main Topics  . Smoking status: Current Some Day Smoker    Types: Cigars  . Smokeless tobacco: Never Used  . Alcohol Use: 1.5 oz/week    3 drink(s) per week     Comment: pregnant at time of appt  . Drug Use: No  . Sexual Activity: Yes    Birth Control/ Protection: None   Other Topics Concern  . Not on file   Social History Narrative   Works at [employer]   [place].   3 children: Ages 348,6,2 y/o    Lives with her Mom, her brother, and her 3 children.          Review of Systems  All other systems reviewed and  are negative.      Objective:   Physical Exam  Vitals reviewed. Cardiovascular: Normal rate, regular rhythm and normal heart sounds.   Pulmonary/Chest: Effort normal and breath sounds normal. No respiratory distress. She has no wheezes. She has no rales.  Skin: Rash noted. No erythema.          Assessment & Plan:  Eczema - Plan: triamcinolone ointment (KENALOG) 0.5 %  Hematochezia - Plan: hydrocortisone (ANUSOL-HC) 25 MG suppository  Ankle pain, chronic, left - Plan: Ambulatory referral to Orthopedic Surgery   Patient has eczema on her face. Give her triamcinolone to limit 0.5% to be applied once to twice daily for no more than one week.: I warned the patient about chronic use of this and possible development of dependency.    My best guess as to the cause of her bright red  blood per rectum would be an internal hemorrhoid. She refuses a rectal exam today. I would try treating her empirically with Anusol-HC suppositories twice a day for one week. If the bleeding persists, she will need a rectal examination. Patient agrees to this plan.  Refer the patient to orthopedics for possible cortisone shot in her ankle. Unconcerned she may also be developing tarsal tunnel syndrome.

## 2014-08-31 ENCOUNTER — Telehealth: Payer: Self-pay | Admitting: Family Medicine

## 2014-08-31 DIAGNOSIS — K921 Melena: Secondary | ICD-10-CM

## 2014-08-31 MED ORDER — HYDROCORTISONE ACETATE 25 MG RE SUPP: 25.0000 mg | Freq: Two times a day (BID) | RECTAL | Status: DC

## 2014-08-31 NOTE — Telephone Encounter (Signed)
Med sent to requested pharmacy 

## 2014-08-31 NOTE — Telephone Encounter (Signed)
[phone] or [phone]  Patient is calling to get a refill on her suppositories, [facility] not [facility], would like to know if we can call it in on the [facility] on [address]

## 2014-09-21 ENCOUNTER — Encounter: Payer: Self-pay | Admitting: Family Medicine

## 2014-09-25 ENCOUNTER — Encounter (HOSPITAL_COMMUNITY): Payer: Self-pay

## 2014-09-25 ENCOUNTER — Emergency Department (INDEPENDENT_AMBULATORY_CARE_PROVIDER_SITE_OTHER)
Admission: EM | Admit: 2014-09-25 | Discharge: 2014-09-25 | Disposition: A | Discharge status: Home / Self Care | Payer: Medicaid Other | Source: Home / Self Care | Attending: Family Medicine | Admitting: Family Medicine

## 2014-09-25 DIAGNOSIS — S76311A Strain of muscle, fascia and tendon of the posterior muscle group at thigh level, right thigh, initial encounter: Secondary | ICD-10-CM

## 2014-09-25 MED ORDER — IBUPROFEN 800 MG PO TABS: 800.0000 mg | ORAL_TABLET | Freq: Three times a day (TID) | ORAL | Status: DC

## 2014-09-25 NOTE — ED Notes (Signed)
States she was leaving her residence, when she fell going down stairs, fell down 6 per her estimate. Reportedly landed on her buttocks and struck the back of her right knee on one of the steps. NAD, but states she walks w a limp

## 2014-09-25 NOTE — ED Provider Notes (Signed)
CSN: [id number]     Arrival date & time 09/25/14  1457 History   First MD Initiated Contact with Patient 09/25/14 1519     Chief Complaint  Patient presents with  . Fall   (Consider location/radiation/quality/duration/timing/severity/associated sxs/prior Treatment) Patient is a 28 y.o. female presenting with fall. The history is provided by the patient.  Fall This is a new problem. The current episode started 12 to 24 hours ago (slid downstairs last eve around midnight with contusion to right post thigh and knee, ambulatory.). The problem has been gradually improving. Pertinent negatives include no chest pain and no abdominal pain.    Past Medical History  Diagnosis Date  . Iron deficiency anemia due to chronic blood loss   . Cyst, mediastinum     pt. reports having the cyst removed last year and has reoccured  . Morbid obesity     s/p gastric bypass surgery in 2009  . Asthma   . Pregnancy induced hypertension     2006   Past Surgical History  Procedure Laterality Date  . Gastric bypass  05/2008  . Cesarean section    . Gastric bypass  2009  . Wisdom tooth extraction  09/2010  . Bone cyst excision  2010  . Cesarean section  09/02/2011    Procedure: CESAREAN SECTION;  Surgeon: Purcell NailsAngela Y Roberts, MD;  Location: [facility];  Service: Gynecology;  Laterality: N/A;   Family History  Problem Relation Age of Onset  . Asthma Mother   . Asthma Brother    History  Substance Use Topics  . Smoking status: Current Some Day Smoker    Types: Cigars  . Smokeless tobacco: Never Used  . Alcohol Use: 1.5 oz/week    3 drink(s) per week     Comment: pregnant at time of appt   OB History    Gravida Para Term Preterm AB TAB SAB Ectopic Multiple Living   3 3 3       3      Review of Systems  Constitutional: Negative.   Cardiovascular: Negative for chest pain.  Gastrointestinal: Negative for abdominal pain.  Musculoskeletal: Negative for myalgias, joint swelling and gait problem.  Skin:  Negative.     Allergies  Review of patient's allergies indicates no known allergies.  Home Medications   Prior to Admission medications   Medication Sig Start Date End Date Taking? Authorizing Provider  levonorgestrel (MIRENA) 20 MCG/24HR IUD 1 each by Intrauterine route once.   Yes Historical Provider, MD  lisinopril-hydrochlorothiazide (PRINZIDE,ZESTORETIC) 20-12.5 MG per tablet Take 1 tablet by mouth daily. 03/31/14  Yes Donita BrooksWarren T Pickard, MD  Ferrous Sulfate Dried (FEOSOL) 200 (65 FE) MG TABS Take 1 tablet by mouth 2 (two) times daily. 05/27/14   Donita BrooksWarren T Pickard, MD  hydrocortisone (ANUSOL-HC) 25 MG suppository Place 1 suppository (25 mg total) rectally 2 (two) times daily. 08/31/14   Donita BrooksWarren T Pickard, MD  ibuprofen (ADVIL,MOTRIN) 800 MG tablet Take 1 tablet (800 mg total) by mouth 3 (three) times daily. For leg soreness 09/25/14   Linna HoffJames D Hadyn Blanck, MD  Naltrexone-Bupropion HCl ER (CONTRAVE) 8-90 MG TB12 Take 2 tablets by mouth 2 (two) times daily. 08/20/14   Donita BrooksWarren T Pickard, MD  triamcinolone ointment (KENALOG) 0.5 % Apply 1 application topically 2 (two) times daily. 08/20/14   Donita BrooksWarren T Pickard, MD   BP 135/83 mmHg  Pulse 106  Temp(Src) 98.4 F (36.9 C) (Oral)  Resp 20  SpO2 99%  LMP 08/24/2014 Physical Exam  Constitutional:  She is oriented to person, place, and time. She appears well-developed and well-nourished. No distress.  Musculoskeletal: Normal range of motion. She exhibits tenderness.       Legs: Neurological: She is alert and oriented to person, place, and time.  Skin: Skin is warm and dry.  Nursing note and vitals reviewed.   ED Course  Procedures (including critical care time) Labs Review Labs Reviewed - No data to display  Imaging Review No results found.   MDM   1. Hamstring strain, right, initial encounter        Linna HoffJames D Willett Lefeber, MD 09/25/14 1549

## 2014-09-25 NOTE — Discharge Instructions (Signed)
Ice and ace and medicine as needed, activity as tolerated. Ok to work as planned.

## 2014-10-12 ENCOUNTER — Telehealth: Payer: Self-pay | Admitting: Family Medicine

## 2014-10-12 MED ORDER — LISINOPRIL-HYDROCHLOROTHIAZIDE 20-12.5 MG PO TABS: 1.0000 | ORAL_TABLET | Freq: Every day | ORAL | Status: DC

## 2014-10-12 NOTE — Telephone Encounter (Signed)
Med refilled per protocol 

## 2014-10-12 NOTE — Telephone Encounter (Signed)
[facility]

## 2015-03-26 ENCOUNTER — Emergency Department (HOSPITAL_COMMUNITY)
Admission: EM | Admit: 2015-03-26 | Discharge: 2015-03-26 | Disposition: A | Discharge status: Home / Self Care | Payer: Medicaid Other | Attending: Emergency Medicine | Admitting: Emergency Medicine

## 2015-03-26 ENCOUNTER — Encounter (HOSPITAL_COMMUNITY): Payer: Self-pay | Admitting: Emergency Medicine

## 2015-03-26 ENCOUNTER — Emergency Department (HOSPITAL_COMMUNITY): Payer: Medicaid Other

## 2015-03-26 DIAGNOSIS — Z720 Tobacco use: Secondary | ICD-10-CM | POA: Insufficient documentation

## 2015-03-26 DIAGNOSIS — Z9884 Bariatric surgery status: Secondary | ICD-10-CM | POA: Diagnosis not present

## 2015-03-26 DIAGNOSIS — S93401A Sprain of unspecified ligament of right ankle, initial encounter: Secondary | ICD-10-CM | POA: Diagnosis not present

## 2015-03-26 DIAGNOSIS — W108XXA Fall (on) (from) other stairs and steps, initial encounter: Secondary | ICD-10-CM | POA: Diagnosis not present

## 2015-03-26 DIAGNOSIS — I10 Essential (primary) hypertension: Secondary | ICD-10-CM | POA: Diagnosis not present

## 2015-03-26 DIAGNOSIS — Z8775 Personal history of (corrected) congenital malformations of respiratory system: Secondary | ICD-10-CM | POA: Diagnosis not present

## 2015-03-26 DIAGNOSIS — Z7952 Long term (current) use of systemic steroids: Secondary | ICD-10-CM | POA: Diagnosis not present

## 2015-03-26 DIAGNOSIS — Y9289 Other specified places as the place of occurrence of the external cause: Secondary | ICD-10-CM | POA: Insufficient documentation

## 2015-03-26 DIAGNOSIS — J45909 Unspecified asthma, uncomplicated: Secondary | ICD-10-CM | POA: Insufficient documentation

## 2015-03-26 DIAGNOSIS — Z79899 Other long term (current) drug therapy: Secondary | ICD-10-CM | POA: Diagnosis not present

## 2015-03-26 DIAGNOSIS — D500 Iron deficiency anemia secondary to blood loss (chronic): Secondary | ICD-10-CM | POA: Diagnosis not present

## 2015-03-26 DIAGNOSIS — Y998 Other external cause status: Secondary | ICD-10-CM | POA: Insufficient documentation

## 2015-03-26 DIAGNOSIS — Y9389 Activity, other specified: Secondary | ICD-10-CM | POA: Insufficient documentation

## 2015-03-26 DIAGNOSIS — S99911A Unspecified injury of right ankle, initial encounter: Secondary | ICD-10-CM | POA: Diagnosis present

## 2015-03-26 DIAGNOSIS — M25579 Pain in unspecified ankle and joints of unspecified foot: Secondary | ICD-10-CM

## 2015-03-26 MED ORDER — OXYCODONE-ACETAMINOPHEN 5-325 MG PO TABS
1.0000 | ORAL_TABLET | Freq: Once | ORAL | Status: AC
  Administered 2015-03-26: 1 via ORAL
  Filled 2015-03-26: qty 1

## 2015-03-26 MED ORDER — IBUPROFEN 800 MG PO TABS: 800.0000 mg | ORAL_TABLET | Freq: Three times a day (TID) | ORAL | Status: DC

## 2015-03-26 NOTE — ED Notes (Signed)
Pt sts fell down three steps that now having right foot and ankle pain since missing three steps; CMS intact

## 2015-03-26 NOTE — Discharge Instructions (Signed)
Ankle Sprain °An ankle sprain is an injury to the strong, fibrous tissues (ligaments) that hold the bones of your ankle joint together.  °CAUSES °An ankle sprain is usually caused by a fall or by twisting your ankle. Ankle sprains most commonly occur when you step on the outer edge of your foot, and your ankle turns inward. People who participate in sports are more prone to these types of injuries.  °SYMPTOMS  °· Pain in your ankle. The pain may be present at rest or only when you are trying to stand or walk. °· Swelling. °· Bruising. Bruising may develop immediately or within 1 to 2 days after your injury. °· Difficulty standing or walking, particularly when turning corners or changing directions. °DIAGNOSIS  °Your caregiver will ask you details about your injury and perform a physical exam of your ankle to determine if you have an ankle sprain. During the physical exam, your caregiver will press on and apply pressure to specific areas of your foot and ankle. Your caregiver will try to move your ankle in certain ways. An X-ray exam may be done to be sure a bone was not broken or a ligament did not separate from one of the bones in your ankle (avulsion fracture).  °TREATMENT  °Certain types of braces can help stabilize your ankle. Your caregiver can make a recommendation for this. Your caregiver may recommend the use of medicine for pain. If your sprain is severe, your caregiver may refer you to a surgeon who helps to restore function to parts of your skeletal system (orthopedist) or a physical therapist. °HOME CARE INSTRUCTIONS  °1. Apply ice to your injury for 1-2 days or as directed by your caregiver. Applying ice helps to reduce inflammation and pain. °1. Put ice in a plastic bag. °2. Place a towel between your skin and the bag. °3. Leave the ice on for 15-20 minutes at a time, every 2 hours while you are awake. °2. Only take over-the-counter or prescription medicines for pain, discomfort, or fever as directed  by your caregiver. °3. Elevate your injured ankle above the level of your heart as much as possible for 2-3 days. °4. If your caregiver recommends crutches, use them as instructed. Gradually put weight on the affected ankle. Continue to use crutches or a cane until you can walk without feeling pain in your ankle. °5. If you have a plaster splint, wear the splint as directed by your caregiver. Do not rest it on anything harder than a pillow for the first 24 hours. Do not put weight on it. Do not get it wet. You may take it off to take a shower or bath. °6. You may have been given an elastic bandage to wear around your ankle to provide support. If the elastic bandage is too tight (you have numbness or tingling in your foot or your foot becomes cold and blue), adjust the bandage to make it comfortable. °7. If you have an air splint, you may blow more air into it or let air out to make it more comfortable. You may take your splint off at night and before taking a shower or bath. Wiggle your toes in the splint several times per day to decrease swelling. °SEEK MEDICAL CARE IF:  °1. You have rapidly increasing bruising or swelling. °2. Your toes feel extremely cold or you lose feeling in your foot. °3. Your pain is not relieved with medicine. °SEEK IMMEDIATE MEDICAL CARE IF: °1. Your toes are numb or blue. °  2. You have severe pain that is increasing. MAKE SURE YOU:  1. Understand these instructions. 2. Will watch your condition. 3. Will get help right away if you are not doing well or get worse. Document Released: 11/06/2005 Document Revised: 07/31/2012 Document Reviewed: 11/18/2011 [facility] Patient Information 2015 [place], [place]. This information is not intended to replace advice given to you by your health care provider. Make sure you discuss any questions you have with your health care provider. Crutch Use Crutches take weight off one of your legs or feet when you stand or walk. It is important to use crutches  that fit right. Your crutches fit right if:  You can fit 2-3 fingers between your armpit and the crutch.  You use your hands, not your armpits, to hold yourself up. Do not put your armpits on the crutches. This can damage the nerves in your hands and arms. Crutches should be a little below your armpits. HOW TO USE YOUR CRUTCHES Walking 8. Step with the crutches. 9. Swing the good leg a little bit in front of the crutches. Going Up Steps If there is no handrail: 4. Step up with the good leg. 5. Step up with the crutches and hurt leg. 6. Continue in this way. If there is a handrail: 3. Hold both crutches in one hand. 4. Place your free hand on the handrail. 5. Put your weight on your arms and lift your good leg to the step. 6. Bring the crutches and the hurt leg up to that step. 7. Continue in this way. Going Down Steps Be very careful, as going down stairs with crutches is very challenging. If there is no handrail: 4. Step down with the hurt leg and crutches. 5. Step down with the good leg. If there is a handrail: 1. Place your hand on the handrail. 2. Hold both crutches with your free hand. 3. Lower your hurt leg and crutch to the step below you. Make sure to keep the crutch tips in the center of the step, never on the edge. 4. Lower your good leg to that step. 5. Continue in this way. Standing Up 1. Hold the hurt leg forward. 2. Grab the armrest with one hand and the top of the crutches with the other hand. 3. Pull yourself up to a standing position. Sitting Down 1. Hold the hurt leg forward. 2. Grab the armrest with one hand and the top of the crutches with the other hand. 3.  Lower yourself to a sitting position. GET HELP IF:  You still feel wobbly on your feet.  You develop new pain, for example in your armpits, back, shoulder, wrist, or hip.  You cannot feel a part of your body (numb).  You have tingling. GET HELP RIGHT AWAY IF: You fall. Document Released:  04/24/2008 Document Revised: 08/27/2013 Document Reviewed: 07/14/2013 [facility] Patient Information 2015 [place], [place]. This information is not intended to replace advice given to you by your health care provider. Make sure you discuss any questions you have with your health care provider.

## 2015-03-26 NOTE — ED Provider Notes (Signed)
CSN: [id number]     Arrival date & time 03/26/15  1651 History  This chart was scribed for non-physician practitioner, Lacey Mornavid Adore Kithcart, NP, working with Blane OharaJoshua Zavitz, MD, by Bronson CurbJacqueline Melvin, ED Scribe. This patient was seen in room TR07C/TR07C and the patient's care was started at 5:09 PM.     Chief Complaint  Patient presents with  . Ankle Pain    Patient is a 29 y.o. female presenting with ankle pain. The history is provided by the patient. No language interpreter was used.  Ankle Pain Time since incident:  12 hours Pain details:    Radiates to:  Does not radiate   Severity:  Moderate   Onset quality:  Sudden   Duration:  12 hours   Timing:  Constant   Progression:  Unchanged Chronicity:  New Dislocation: no   Foreign body present:  No foreign bodies Prior injury to area:  No Relieved by:  None tried Worsened by:  Bearing weight Ineffective treatments:  None tried Associated symptoms: swelling   Associated symptoms: no back pain, no fever and no neck pain      HPI Comments: Lacey Eleirdre Whitaker is a 29 y.o. female who presents to the Emergency Department complaining of sudden onset right ankle pain that began approximately 12 hours ago. Patient states she was ambulating down the stairs when she missed 3 steps. There is associated swelling to the right ankle and patient states she is unable to bear wright. Patient has history of HTN which she is currently taking medication and notes compliance. She denies history of DM. She further denies back pain, neck pain, abdominal pain, SOB, nausea, vomiting, or chest pain. No other injuries noted.   Past Medical History  Diagnosis Date  . Iron deficiency anemia due to chronic blood loss   . Cyst, mediastinum     pt. reports having the cyst removed last year and has reoccured  . Morbid obesity     s/p gastric bypass surgery in 2009  . Asthma   . Pregnancy induced hypertension     2006   Past Surgical History  Procedure Laterality Date  .  Gastric bypass  05/2008  . Cesarean section    . Gastric bypass  2009  . Wisdom tooth extraction  09/2010  . Bone cyst excision  2010  . Cesarean section  09/02/2011    Procedure: CESAREAN SECTION;  Surgeon: Purcell NailsAngela Y Roberts, MD;  Location: [facility];  Service: Gynecology;  Laterality: N/A;   Family History  Problem Relation Age of Onset  . Asthma Mother   . Asthma Brother    History  Substance Use Topics  . Smoking status: Current Some Day Smoker    Types: Cigars  . Smokeless tobacco: Never Used  . Alcohol Use: 1.5 oz/week    3 drink(s) per week     Comment: pregnant at time of appt   OB History    Gravida Para Term Preterm AB TAB SAB Ectopic Multiple Living   3 3 3       3      Review of Systems  Constitutional: Negative for fever.  Respiratory: Negative for cough and shortness of breath.   Cardiovascular: Negative for chest pain.  Gastrointestinal: Negative for nausea, vomiting and abdominal pain.  Musculoskeletal: Positive for joint swelling and arthralgias. Negative for back pain and neck pain.  Skin: Negative for wound.  All other systems reviewed and are negative.     Allergies  Review of patient's allergies indicates no  known allergies.  Home Medications   Prior to Admission medications   Medication Sig Start Date End Date Taking? Authorizing Provider  Ferrous Sulfate Dried (FEOSOL) 200 (65 FE) MG TABS Take 1 tablet by mouth 2 (two) times daily. 05/27/14   Donita BrooksWarren T Pickard, MD  hydrocortisone (ANUSOL-HC) 25 MG suppository Place 1 suppository (25 mg total) rectally 2 (two) times daily. 08/31/14   Donita BrooksWarren T Pickard, MD  ibuprofen (ADVIL,MOTRIN) 800 MG tablet Take 1 tablet (800 mg total) by mouth 3 (three) times daily. For leg soreness 09/25/14   Linna HoffJames D Kindl, MD  levonorgestrel (MIRENA) 20 MCG/24HR IUD 1 each by Intrauterine route once.    Historical Provider, MD  lisinopril-hydrochlorothiazide (PRINZIDE,ZESTORETIC) 20-12.5 MG per tablet Take 1 tablet by mouth daily.  10/12/14   Donita BrooksWarren T Pickard, MD  Naltrexone-Bupropion HCl ER (CONTRAVE) 8-90 MG TB12 Take 2 tablets by mouth 2 (two) times daily. 08/20/14   Donita BrooksWarren T Pickard, MD  triamcinolone ointment (KENALOG) 0.5 % Apply 1 application topically 2 (two) times daily. 08/20/14   Donita BrooksWarren T Pickard, MD   Triage Vitals: BP 149/69 mmHg  Pulse 91  Temp(Src) 99.2 F (37.3 C) (Oral)  Resp 17  SpO2 100%  Physical Exam  Constitutional: She is oriented to person, place, and time. She appears well-developed and well-nourished. No distress.  HENT:  Head: Normocephalic and atraumatic.  Eyes: Conjunctivae and EOM are normal.  Neck: Neck supple. No tracheal deviation present.  Cardiovascular: Normal rate.   Pulmonary/Chest: Effort normal. No respiratory distress.  Musculoskeletal: Normal range of motion. She exhibits edema and tenderness.  Lateral malleolar tenderness with swelling.   Neurological: She is alert and oriented to person, place, and time.  Skin: Skin is warm and dry.  Psychiatric: She has a normal mood and affect. Her behavior is normal.  Nursing note and vitals reviewed.   ED Course  Procedures (including critical care time)  DIAGNOSTIC STUDIES: Oxygen Saturation is 100% on room air, normal by my interpretation.    COORDINATION OF CARE: At 1712 Discussed treatment plan with patient which includes imaging. Patient agrees.   Labs Review Labs Reviewed - No data to display  Imaging Review Dg Ankle Complete Right  03/26/2015   CLINICAL DATA:  Anterior and lateral right ankle pain x1 day s/p stumbling down stairs this morning.No hx of right ankle injuries.  EXAM: RIGHT ANKLE - COMPLETE 3+ VIEW  COMPARISON:  None.  FINDINGS: There is no evidence of fracture, dislocation, or joint effusion. There is no evidence of arthropathy or other focal bone abnormality. Soft tissues are unremarkable.  IMPRESSION: Negative.   Electronically Signed   By: Amie Portlandavid  Ormond M.D.   On: 03/26/2015 18:11     EKG  Interpretation None     Radiology results reviewed and shared with patient. MDM   Final diagnoses:  None    Right ankle [place]. RICEM. Crutches. Air splint. Anti-inflammatory. Follow-up with PCP/orthopedics. Return precautions discussed.   I personally performed the services described in this documentation, which was scribed in my presence. The recorded information has been reviewed and is accurate.   Lacey Mornavid Winta Barcelo, NP 03/26/15 1832  Blane OharaJoshua Zavitz, MD 03/27/15 [phone]

## 2015-06-29 ENCOUNTER — Encounter: Payer: Medicaid Other | Admitting: Family Medicine

## 2015-07-19 ENCOUNTER — Encounter: Payer: Medicaid Other | Admitting: Family Medicine

## 2015-07-29 ENCOUNTER — Encounter: Payer: Medicaid Other | Admitting: Family Medicine

## 2015-08-05 ENCOUNTER — Ambulatory Visit (INDEPENDENT_AMBULATORY_CARE_PROVIDER_SITE_OTHER): Payer: Medicaid Other | Admitting: Family Medicine

## 2015-08-05 ENCOUNTER — Encounter: Payer: Self-pay | Admitting: Family Medicine

## 2015-08-05 VITALS — BP 146/80 | HR 86 | Temp 99.1°F | Resp 18 | Ht 67.0 in | Wt 347.0 lb

## 2015-08-05 DIAGNOSIS — Z0000 Encounter for general adult medical examination without abnormal findings: Secondary | ICD-10-CM

## 2015-08-05 MED ORDER — PHENTERMINE HCL 37.5 MG PO CAPS: 37.5000 mg | ORAL_CAPSULE | ORAL | Status: DC

## 2015-08-05 NOTE — Progress Notes (Signed)
Subjective:    Patient ID: Lacey Whitaker, female    DOB: 06-Jan-1986, 29 y.o.   MRN: [id number]  HPI Patient is here for CPE.  Gets pap and pelvic from GYN.  Has a history of anemia but has stopped iron.  Is compliant taking her blood pressure medication although her blood pressure still high. Patient has gained almost 30 pounds since last year. She continues to complain of swelling on the dorsum of her left foot. X-rays of been normal. The edema appears to be venous insufficiency coupled with perhaps some mild lymphedema, complicated by morbid obesity. She is interested in means to achieve weight loss. She cannot afford any other weight loss drugs. She states that she is exercising 30 minutes 5 days a week. However she is not counting calories. She is also eating late at night. She also drinks alcohol Past Medical History  Diagnosis Date  . Iron deficiency anemia due to chronic blood loss   . Cyst, mediastinum     pt. reports having the cyst removed last year and has reoccured  . Morbid obesity     s/p gastric bypass surgery in 2009  . Asthma   . Pregnancy induced hypertension     2006   Past Surgical History  Procedure Laterality Date  . Gastric bypass  05/2008  . Cesarean section    . Gastric bypass  2009  . Wisdom tooth extraction  09/2010  . Bone cyst excision  2010  . Cesarean section  09/02/2011    Procedure: CESAREAN SECTION;  Surgeon: Purcell Nails, MD;  Location: [facility];  Service: Gynecology;  Laterality: N/A;   Current Outpatient Prescriptions on File Prior to Visit  Medication Sig Dispense Refill  . levonorgestrel (MIRENA) 20 MCG/24HR IUD 1 each by Intrauterine route once.    Marland Kitchen lisinopril-hydrochlorothiazide (PRINZIDE,ZESTORETIC) 20-12.5 MG per tablet Take 1 tablet by mouth daily. 30 tablet 5  . Ferrous Sulfate Dried (FEOSOL) 200 (65 FE) MG TABS Take 1 tablet by mouth 2 (two) times daily. (Patient not taking: Reported on 08/05/2015) 60 tablet 5   No current  facility-administered medications on file prior to visit.   No Known Allergies Social History   Social History  . Marital Status: Single    Spouse Name: N/A  . Number of Children: N/A  . Years of Education: N/A   Occupational History  . Not on file.   Social History Main Topics  . Smoking status: Current Some Day Smoker    Types: Cigars  . Smokeless tobacco: Never Used  . Alcohol Use: 1.5 oz/week    3 drink(s) per week     Comment: pregnant at time of appt  . Drug Use: No  . Sexual Activity: Yes    Birth Control/ Protection: None   Other Topics Concern  . Not on file   Social History Narrative   Works at [employer].   3 children: Ages 67,6,2 y/o    Lives with her Mom, her brother, and her 3 children.       Family History  Problem Relation Age of Onset  . Asthma Mother   . Asthma Brother       Review of Systems  All other systems reviewed and are negative.      Objective:   Physical Exam  Constitutional: She is oriented to person, place, and time. She appears well-developed and well-nourished. No distress.  HENT:  Head: Normocephalic and atraumatic.  Right Ear: External ear normal.  Left Ear: External ear normal.  Nose: Nose normal.  Mouth/Throat: Oropharynx is clear and moist. No oropharyngeal exudate.  Eyes: Conjunctivae and EOM are normal. Pupils are equal, round, and reactive to light. Right eye exhibits no discharge. Left eye exhibits no discharge. No scleral icterus.  Neck: Normal range of motion. Neck supple. No JVD present. No tracheal deviation present. No thyromegaly present.  Cardiovascular: Normal rate, regular rhythm, normal heart sounds and intact distal pulses.  Exam reveals no gallop and no friction rub.   No murmur heard. Pulmonary/Chest: Effort normal and breath sounds normal. No stridor. No respiratory distress. She has no wheezes. She has no rales. She exhibits no tenderness.  Abdominal: Soft. Bowel sounds are normal. She exhibits  no distension and no mass. There is no tenderness. There is no rebound and no guarding.  Musculoskeletal: She exhibits edema.  Lymphadenopathy:    She has no cervical adenopathy.  Neurological: She is alert and oriented to person, place, and time. She has normal reflexes. She displays normal reflexes. No cranial nerve deficit. She exhibits normal muscle tone. Coordination normal.  Skin: Skin is warm. No rash noted. She is not diaphoretic. No erythema. No pallor.  Psychiatric: She has a normal mood and affect. Her behavior is normal. Judgment and thought content normal.  Vitals reviewed.         Assessment & Plan:  Routine general medical examination at a health care facility  Morbid obesity - Plan: phentermine 37.5 MG capsule  Increased blood pressure medication to 2 pills a day. Return fasting for a CBC, fasting lipid panel, CMP. Recommended a flu shot today. I recommended 1500 cal a day or less. I recommended 30 minutes a day of aerobic exercise. I recommended avoidance of all alcohol. Do not eat after 7:00 at night. Limit calories. Avoid junk food. I will also try the patient on Adipex 37.5 mg by mouth every morning for 3 months. I realize this can increase her blood pressure however we have got to take steps to lower her weight to prevent long-term sequela. For that reason I'm also increasing her blood pressure medication. Recheck in 3 months patient defers her flu shot at the present time. I believe the edema in her left leg is some mild lymphedema. I recommended compression stockings.

## 2015-08-23 ENCOUNTER — Other Ambulatory Visit: Payer: Self-pay | Admitting: Family Medicine

## 2015-10-09 ENCOUNTER — Encounter (HOSPITAL_COMMUNITY): Payer: Self-pay | Admitting: *Deleted

## 2015-10-09 ENCOUNTER — Emergency Department (INDEPENDENT_AMBULATORY_CARE_PROVIDER_SITE_OTHER)
Admission: EM | Admit: 2015-10-09 | Discharge: 2015-10-09 | Disposition: A | Discharge status: Home / Self Care | Payer: Medicaid Other | Source: Home / Self Care | Attending: Emergency Medicine | Admitting: Emergency Medicine

## 2015-10-09 DIAGNOSIS — H00013 Hordeolum externum right eye, unspecified eyelid: Secondary | ICD-10-CM

## 2015-10-09 MED ORDER — ERYTHROMYCIN 5 MG/GM OP OINT: TOPICAL_OINTMENT | OPHTHALMIC | Status: DC

## 2015-10-09 NOTE — ED Provider Notes (Signed)
CSN: [id number]     Arrival date & time 10/09/15  1710 History   First MD Initiated Contact with Patient 10/09/15 1744     Chief Complaint  Patient presents with  . Eye Problem   (Consider location/radiation/quality/duration/timing/severity/associated sxs/prior Treatment) HPI Comments: 29 year old severely and morbidly obese female complaining of right eye pain that started last PM. She is under the impression that insect crawled into her eyes and bit her. She is having tenderness around the right lower lid and to the right upper lid. Is producing an intermittent watery discharge and occasional blurring of vision. She also states that the left eye is watering as well. No history of trauma.  Patient is a 29 y.o. female presenting with eye problem.  Eye Problem Associated symptoms: discharge and redness     Past Medical History  Diagnosis Date  . Iron deficiency anemia due to chronic blood loss   . Cyst, mediastinum     pt. reports having the cyst removed last year and has reoccured  . Morbid obesity (HCC)     s/p gastric bypass surgery in 2009  . Asthma   . Pregnancy induced hypertension     2006   Past Surgical History  Procedure Laterality Date  . Gastric bypass  05/2008  . Cesarean section    . Gastric bypass  2009  . Wisdom tooth extraction  09/2010  . Bone cyst excision  2010  . Cesarean section  09/02/2011    Procedure: CESAREAN SECTION;  Surgeon: Purcell NailsAngela Y Roberts, MD;  Location: [facility];  Service: Gynecology;  Laterality: N/A;   Family History  Problem Relation Age of Onset  . Asthma Mother   . Asthma Brother    Social History  Substance Use Topics  . Smoking status: Current Some Day Smoker    Types: Cigars  . Smokeless tobacco: Never Used     Comment: Smokes 2 Black & Milds daily  . Alcohol Use: Yes   OB History    Gravida Para Term Preterm AB TAB SAB Ectopic Multiple Living   3 3 3       3      Review of Systems  Constitutional: Negative.   HENT: Negative  for congestion, postnasal drip, rhinorrhea and sore throat.   Eyes: Positive for pain, discharge and redness.  Respiratory: Negative.   Neurological: Negative.     Allergies  Review of patient's allergies indicates no known allergies.  Home Medications   Prior to Admission medications   Medication Sig Start Date End Date Taking? Authorizing Provider  levonorgestrel (MIRENA) 20 MCG/24HR IUD 1 each by Intrauterine route once.   Yes Historical Provider, MD  lisinopril-hydrochlorothiazide (PRINZIDE,ZESTORETIC) 20-12.5 MG tablet Take 1 tablet by mouth daily. 08/23/15  Yes Donita BrooksWarren T Pickard, MD  Ferrous Sulfate Dried (FEOSOL) 200 (65 FE) MG TABS Take 1 tablet by mouth 2 (two) times daily. Patient not taking: Reported on 08/05/2015 05/27/14   Donita BrooksWarren T Pickard, MD  phentermine 37.5 MG capsule Take 1 capsule (37.5 mg total) by mouth every morning. 08/05/15   Donita BrooksWarren T Pickard, MD   Meds Ordered and Administered this Visit  Medications - No data to display  BP 130/82 mmHg  Pulse 86  Temp(Src) 99.6 F (37.6 C) (Oral)  Resp 20  SpO2 100% No data found.   Physical Exam  Constitutional: She appears well-developed and well-nourished. No distress.  Eyes: EOM are normal. Pupils are equal, round, and reactive to light.  Swelling and erythema to the right  lower content time. Just right of the midline there is a pustule along the hairline with underlying swelling and tenderness. Mild swelling and light erythema to the lower lid. There is minor tenderness along the inner edge of the right upper eyelid with there is a small pustule with a drop of purulence over the pustule.  Neck: Normal range of motion. Neck supple.  Pulmonary/Chest: Effort normal. No respiratory distress.  Neurological: She is alert. She exhibits normal muscle tone.  Skin: Skin is warm and dry.  Psychiatric: She has a normal mood and affect.  Nursing note and vitals reviewed.   ED Course  Procedures (including critical care  time)  Labs Review Labs Reviewed - No data to display  Imaging Review No results found.   Visual Acuity Review  Right Eye Distance:   Left Eye Distance:   Bilateral Distance:    Right Eye Near:   Left Eye Near:    Bilateral Near:         MDM   1. Hordeolum external, right    Warm compresses frequently Erythromycin op oint tid See your PCP next week as needed or not improving    Hayden Rasmussen, NP 10/09/15 Rickey Primus

## 2015-10-09 NOTE — Discharge Instructions (Signed)
Stye Warm compresses frequently A stye is a bump on your eyelid caused by a bacterial infection. A stye can form inside the eyelid (internal stye) or outside the eyelid (external stye). An internal stye may be caused by an infected oil-producing gland inside your eyelid. An external stye may be caused by an infection at the base of your eyelash (hair follicle). Styes are very common. Anyone can get them at any age. They usually occur in just one eye, but you may have more than one in either eye.  CAUSES  The infection is almost always caused by bacteria called Staphylococcus aureus. This is a common type of bacteria that lives on your skin. RISK FACTORS You may be at higher risk for a stye if you have had one before. You may also be at higher risk if you have:  Diabetes.  Long-term illness.  Long-term eye redness.  A skin condition called seborrhea.  High fat levels in your blood (lipids). SIGNS AND SYMPTOMS  Eyelid pain is the most common symptom of a stye. Internal styes are more painful than external styes. Other signs and symptoms may include:  Painful swelling of your eyelid.  A scratchy feeling in your eye.  Tearing and redness of your eye.  Pus draining from the stye. DIAGNOSIS  Your health care provider may be able to diagnose a stye just by examining your eye. The health care provider may also check to make sure:  You do not have a fever or other signs of a more serious infection.  The infection has not spread to other parts of your eye or areas around your eye. TREATMENT  Most styes will clear up in a few days without treatment. In some cases, you may need to use antibiotic drops or ointment to prevent infection. Your health care provider may have to drain the stye surgically if your stye is:  Large.  Causing a lot of pain.  Interfering with your vision. This can be done using a thin blade or a needle.  HOME CARE INSTRUCTIONS   Take medicines only as directed by  your health care provider.  Apply a clean, warm compress to your eye for 10 minutes, 4 times a day.  Do not wear contact lenses or eye makeup until your stye has healed.  Do not try to pop or drain the stye. SEEK MEDICAL CARE IF:  You have chills or a fever.  Your stye does not go away after several days.  Your stye affects your vision.  Your eyeball becomes swollen, red, or painful. MAKE SURE YOU:  Understand these instructions.  Will watch your condition.  Will get help right away if you are not doing well or get worse.   This information is not intended to replace advice given to you by your health care provider. Make sure you discuss any questions you have with your health care provider.   Document Released: 08/16/2005 Document Revised: 11/27/2014 Document Reviewed: 02/20/2014 Elsevier Interactive Patient Education [employer]2016 Elsevier Inc.

## 2015-10-09 NOTE — ED Notes (Signed)
Woke up yesterday with right eye soreness and swelling, with small bump noted to lower lid.  This morning woke with increased swelling, eye pain with HA; noticed tiny whitehead to inside of upper lid.  Denies fevers.  C/O slight intermittent blurriness in right eye.  Does not wear contact lenses.

## 2015-10-26 ENCOUNTER — Ambulatory Visit: Payer: Medicaid Other | Admitting: Family Medicine

## 2015-12-30 ENCOUNTER — Ambulatory Visit: Payer: Medicaid Other | Admitting: Family Medicine

## 2016-01-07 ENCOUNTER — Ambulatory Visit: Payer: Medicaid Other | Admitting: Family Medicine

## 2016-01-11 ENCOUNTER — Ambulatory Visit: Payer: Medicaid Other | Admitting: Family Medicine

## 2016-03-18 ENCOUNTER — Other Ambulatory Visit: Payer: Self-pay | Admitting: Family Medicine

## 2016-05-08 ENCOUNTER — Ambulatory Visit (HOSPITAL_COMMUNITY)
Admission: EM | Admit: 2016-05-08 | Discharge: 2016-05-08 | Disposition: A | Discharge status: Home / Self Care | Payer: Medicaid Other | Attending: Emergency Medicine | Admitting: Emergency Medicine

## 2016-05-08 ENCOUNTER — Encounter (HOSPITAL_COMMUNITY): Payer: Self-pay | Admitting: Emergency Medicine

## 2016-05-08 DIAGNOSIS — Z825 Family history of asthma and other chronic lower respiratory diseases: Secondary | ICD-10-CM | POA: Insufficient documentation

## 2016-05-08 DIAGNOSIS — K219 Gastro-esophageal reflux disease without esophagitis: Secondary | ICD-10-CM | POA: Insufficient documentation

## 2016-05-08 DIAGNOSIS — J029 Acute pharyngitis, unspecified: Secondary | ICD-10-CM | POA: Insufficient documentation

## 2016-05-08 DIAGNOSIS — F17290 Nicotine dependence, other tobacco product, uncomplicated: Secondary | ICD-10-CM | POA: Insufficient documentation

## 2016-05-08 DIAGNOSIS — I10 Essential (primary) hypertension: Secondary | ICD-10-CM | POA: Insufficient documentation

## 2016-05-08 DIAGNOSIS — Z9884 Bariatric surgery status: Secondary | ICD-10-CM | POA: Diagnosis not present

## 2016-05-08 LAB — POCT RAPID STREP A: STREPTOCOCCUS, GROUP A SCREEN (DIRECT): NEGATIVE

## 2016-05-08 MED ORDER — LIDOCAINE VISCOUS 2 % MT SOLN: 10.0000 mL | Freq: Four times a day (QID) | OROMUCOSAL | Status: DC | PRN

## 2016-05-08 MED ORDER — IBUPROFEN 800 MG PO TABS: 800.0000 mg | ORAL_TABLET | Freq: Three times a day (TID) | ORAL | Status: DC | PRN

## 2016-05-08 NOTE — ED Provider Notes (Signed)
HPI  SUBJECTIVE:  Patient reports sore throat starting 3 days ago. Describes it as being "dry". Sx worse with swallowing, talking.  Sx better with hot tea, ice chips, ibuprofen. Has also tried salt water gargles, TheraFlu. + Fever tmax 102.1    + URI sxs with nasal congestion, postnasal drip. + Myalgias + Headache No Rash     No Recent Strep Exposure, but daughter has similar symptoms last week. No Abdominal Pain + Belching, heartburn 1 No Allergy sxs  No Breathing difficulty, voice changes No Drooling No Trismus No abx in past month. All immunizations UTD.  Pt is a smoker. Past medical history of GERD, strep, hypertension. No history of allergies, mono, diabetes. LMP: Mirena IUD. [facility]   Past Medical History  Diagnosis Date  . Iron deficiency anemia due to chronic blood loss   . Cyst, mediastinum     pt. reports having the cyst removed last year and has reoccured  . Morbid obesity (HCC)     s/p gastric bypass surgery in 2009  . Asthma   . Pregnancy induced hypertension     2006    Past Surgical History  Procedure Laterality Date  . Gastric bypass  05/2008  . Cesarean section    . Gastric bypass  2009  . Wisdom tooth extraction  09/2010  . Bone cyst excision  2010  . Cesarean section  09/02/2011    Procedure: CESAREAN SECTION;  Surgeon: Purcell NailsAngela Y Roberts, MD;  Location: [facility];  Service: Gynecology;  Laterality: N/A;    Family History  Problem Relation Age of Onset  . Asthma Mother   . Asthma Brother     Social History  Substance Use Topics  . Smoking status: Current Some Day Smoker    Types: Cigars  . Smokeless tobacco: Never Used     Comment: Smokes 2 Black & Milds daily  . Alcohol Use: Yes    No current facility-administered medications for this encounter.  Current outpatient prescriptions:  .  ibuprofen (ADVIL,MOTRIN) 800 MG tablet, Take 1 tablet (800 mg total) by mouth every 8 (eight) hours as needed.,  Disp: 30 tablet, Rfl: 0 .  levonorgestrel (MIRENA) 20 MCG/24HR IUD, 1 each by Intrauterine route once., Disp: , Rfl:  .  lidocaine (XYLOCAINE) 2 % solution, Use as directed 10 mLs in the mouth or throat every 6 (six) hours as needed. Swish and spit. Do not swallow., Disp: 100 mL, Rfl: 0 .  lisinopril-hydrochlorothiazide (PRINZIDE,ZESTORETIC) 20-12.5 MG tablet, TAKE 1 TABLET BY MOUTH DAILY, Disp: 90 tablet, Rfl: 4 .  [DISCONTINUED] Ferrous Sulfate Dried (FEOSOL) 200 (65 FE) MG TABS, Take 1 tablet by mouth 2 (two) times daily. (Patient not taking: Reported on 08/05/2015), Disp: 60 tablet, Rfl: 5 .  [DISCONTINUED] phentermine 37.5 MG capsule, Take 1 capsule (37.5 mg total) by mouth every morning., Disp: 30 capsule, Rfl: 2  No Known Allergies   ROS  As noted in HPI.   Physical Exam  BP 157/96 mmHg  Pulse 95  Temp(Src) 98.6 F (37 C) (Oral)  Resp 18  SpO2 99%  Constitutional: Well developed, well nourished, no acute distress Eyes:  EOMI, conjunctiva normal bilaterally HENT: Normocephalic, atraumatic,mucus membranes moist. +nasal congestion +  erythematous oropharynx  - enlarged tonsils - exudates. Uvula midline. Positive cobblestoning. No postnasal drip Respiratory: Normal inspiratory effort Cardiovascular: Normal rate, no murmurs, rubs, gallops GI: nondistended, nontender. No appreciable splenomegaly skin: No rash, skin intact Lymph: - cervical LN  Musculoskeletal: no deformities Neurologic: Alert & oriented x 3, no focal neuro deficits Psychiatric: Speech and behavior appropriate.   ED Course   Medications - No data to display  Orders Placed This Encounter  Procedures  . Culture, group A strep    Standing Status: Standing     Number of Occurrences: 1     Standing Expiration Date:   . POCT rapid strep A [facility])    Standing Status: Standing     Number of Occurrences: 1     Standing Expiration Date:     No results found for this or any previous visit (from the  past 24 hour(s)). No results found.  ED Clinical Impression  Pharyngitis  ED Assessment/Plan   Rapid strep negative. Obtaining throat culture to guide antibiotic treatment. Discussed this with patient. We'll contact her if culture is positive, and will call in Appropriate antibiotics. Patient home with ibuprofen, Tylenol. Patient to followup with PMD when necessary,   5 day work note patient requesting, states this was not FMLA.  Discussed labs, MDM, plan and followup with patient . Discussed sn/sx that should prompt return to the ED. Patient  agrees with plan.  *This clinic note was created using Dragon dictation software. Therefore, there may be occasional mistakes despite careful proofreading.    Domenick Gong, MD 05/09/16 1400

## 2016-05-08 NOTE — ED Notes (Signed)
Patient's accurate number is [phone]  Patient current phone number has been changed.

## 2016-05-08 NOTE — Discharge Instructions (Signed)
your rapid strep was negative today, so we have sent off a throat culture.  We will contact you and call in the appropriate antibiotics if your culture comes back positive for an infection requiring antibiotic treatment.  Give us a working phone number.  If you were given a prescription for antibiotics, you may want to wait and fill it until you know the results of the culture.  Tylenol and ibuprofen together as needed for pain.  Make sure you drink plenty of extra fluids.  Some people find salt water gargles and  Traditional Medicinal's "Throat Coat" tea helpful. Take 5 mL of liquid Benadryl and 5 mL of Maalox. Mix it together, and then hold it in your mouth for as long as you can and then swallow. You may do this 4 times a day.   ° °Go to www.goodrx.com to look up your medications. This will give you a list of where you can find your prescriptions at the most affordable prices. ° °

## 2016-05-08 NOTE — ED Notes (Signed)
Patient has sore throat, sniffles, cough, and congestion.  Reports temp 102.1

## 2016-05-11 ENCOUNTER — Encounter: Payer: Self-pay | Admitting: Family Medicine

## 2016-05-11 ENCOUNTER — Ambulatory Visit (INDEPENDENT_AMBULATORY_CARE_PROVIDER_SITE_OTHER): Payer: Medicaid Other | Admitting: Family Medicine

## 2016-05-11 VITALS — BP 148/80 | HR 98 | Temp 98.2°F | Resp 18 | Ht 67.0 in | Wt 360.0 lb

## 2016-05-11 DIAGNOSIS — K602 Anal fissure, unspecified: Secondary | ICD-10-CM

## 2016-05-11 DIAGNOSIS — I10 Essential (primary) hypertension: Secondary | ICD-10-CM | POA: Diagnosis not present

## 2016-05-11 LAB — CULTURE, GROUP A STREP (THRC)

## 2016-05-11 MED ORDER — DILTIAZEM GEL 2 %: 1.0000 "application " | Freq: Three times a day (TID) | CUTANEOUS | Status: DC

## 2016-05-11 NOTE — Progress Notes (Signed)
Subjective:    Patient ID: Lacey Whitaker, female    DOB: 1986-05-06, 30 y.o.   MRN: [id number]  HPI Is here today reporting a several week history of tearing pain every time she has a bowel movement. She denies any external hemorrhoids or perirectal masses. She does have bright red blood per rectum on occasion. It is a trace amount on the bowel movement itself or on the toilet tissue. She literally feels like she is tearing every time she has a bowel movement. She refuses a digital rectal exam today because she is on her period and she will prefer to defer that. Her blood pressure significantly elevated today although she states that she is very upset at her landlord. However her blood pressure has been elevated last few times I seen her. She is taking Zestoretic 40/25 by mouth daily. Past Medical History  Diagnosis Date  . Iron deficiency anemia due to chronic blood loss   . Cyst, mediastinum     pt. reports having the cyst removed last year and has reoccured  . Morbid obesity (HCC)     s/p gastric bypass surgery in 2009  . Asthma   . Pregnancy induced hypertension     2006   Past Surgical History  Procedure Laterality Date  . Gastric bypass  05/2008  . Cesarean section    . Gastric bypass  2009  . Wisdom tooth extraction  09/2010  . Bone cyst excision  2010  . Cesarean section  09/02/2011    Procedure: CESAREAN SECTION;  Surgeon: Purcell NailsAngela Y Roberts, MD;  Location: [facility];  Service: Gynecology;  Laterality: N/A;   Current Outpatient Prescriptions on File Prior to Visit  Medication Sig Dispense Refill  . levonorgestrel (MIRENA) 20 MCG/24HR IUD 1 each by Intrauterine route once.    Marland Kitchen. lisinopril-hydrochlorothiazide (PRINZIDE,ZESTORETIC) 20-12.5 MG tablet TAKE 1 TABLET BY MOUTH DAILY 90 tablet 4  . [DISCONTINUED] Ferrous Sulfate Dried (FEOSOL) 200 (65 FE) MG TABS Take 1 tablet by mouth 2 (two) times daily. (Patient not taking: Reported on 08/05/2015) 60 tablet 5  . [DISCONTINUED]  phentermine 37.5 MG capsule Take 1 capsule (37.5 mg total) by mouth every morning. 30 capsule 2   No current facility-administered medications on file prior to visit.   No Known Allergies Social History   Social History  . Marital Status: Single    Spouse Name: N/A  . Number of Children: N/A  . Years of Education: N/A   Occupational History  . Not on file.   Social History Main Topics  . Smoking status: Current Some Day Smoker    Types: Cigars  . Smokeless tobacco: Never Used     Comment: Smokes 2 Black & Milds daily  . Alcohol Use: Yes  . Drug Use: No  . Sexual Activity: Yes    Birth Control/ Protection: IUD   Other Topics Concern  . Not on file   Social History Narrative   Works at [employer]   [place].   3 children: Ages 638,6,2 y/o    Lives with her Mom, her brother, and her 3 children.         Review of Systems  All other systems reviewed and are negative.      Objective:   Physical Exam  Cardiovascular: Normal rate, regular rhythm and normal heart sounds.   Pulmonary/Chest: Effort normal and breath sounds normal. No respiratory distress. She has no wheezes. She has no rales.  Vitals reviewed. Refuses rectal exam  Assessment & Plan:  Anal fissure - Plan: diltiazem 2 % GEL  Essential hypertension  Based only on her history, I suspect that the patient has an anal fissure. Begin diltiazem 2% gel applied 3 times a day. Begin a stool softener. Recheck in 3 weeks or sooner if worse. She can return at any time and we will be glad to examine her to determine for sure what the cause is. Her blood pressure significantly elevated. I recommended a renal artery ultrasound to evaluate for renal artery stenosis given her age. However I believe this is likely more likely genetic and due to her obesity.

## 2016-05-12 ENCOUNTER — Telehealth: Payer: Self-pay | Admitting: Family Medicine

## 2016-05-12 NOTE — Telephone Encounter (Signed)
Pt wants to know if there is anything that she can take OTC for her anal pain. The cream that was called in is $50 and she can not afford it. [phone]

## 2016-05-12 NOTE — Telephone Encounter (Signed)
Could try preparation H to see if it will help.

## 2016-05-15 ENCOUNTER — Encounter: Payer: Self-pay | Admitting: Family Medicine

## 2016-05-15 ENCOUNTER — Ambulatory Visit: Payer: Medicaid Other

## 2016-05-15 ENCOUNTER — Telehealth (HOSPITAL_COMMUNITY): Payer: Self-pay | Admitting: Emergency Medicine

## 2016-05-15 NOTE — Telephone Encounter (Signed)
Patient aware of providers recommendations.  

## 2016-05-15 NOTE — ED Notes (Signed)
Called pt and notified of recent lab results from visit 6/19 Pt ID'd properly... Reports feeling better and sx have subsided Adv pt if sx are not getting better to return  Pt verb understanding  Per Dr. Dayton ScrapeMurray,   Notes Recorded by Charm RingsErin J Honig, MD on 05/13/2016 at 1:11 PM Throat culture is negative. [facility]. Follow up if symptoms persist or worsen.

## 2016-05-29 ENCOUNTER — Other Ambulatory Visit: Payer: Self-pay | Admitting: *Deleted

## 2016-05-29 DIAGNOSIS — I10 Essential (primary) hypertension: Secondary | ICD-10-CM

## 2016-05-31 ENCOUNTER — Ambulatory Visit (HOSPITAL_COMMUNITY)
Admission: RE | Admit: 2016-05-31 | Discharge: 2016-05-31 | Disposition: A | Discharge status: Home / Self Care | Payer: Medicaid Other | Source: Ambulatory Visit | Attending: Family Medicine | Admitting: Family Medicine

## 2016-05-31 DIAGNOSIS — I10 Essential (primary) hypertension: Secondary | ICD-10-CM | POA: Insufficient documentation

## 2016-06-06 ENCOUNTER — Telehealth: Payer: Self-pay | Admitting: Family Medicine

## 2016-06-06 MED ORDER — LISINOPRIL-HYDROCHLOROTHIAZIDE 20-12.5 MG PO TABS: 2.0000 | ORAL_TABLET | Freq: Every day | ORAL | Status: DC

## 2016-06-06 NOTE — Telephone Encounter (Signed)
Medication called/sent to requested pharmacy  

## 2016-06-06 NOTE — Telephone Encounter (Signed)
Patient states she needs a new prescription called in to [facility] at [address] she states that Dr. Tanya NonesPickard told her to take 2 tablets instead of 1 on her lisinopril-hydrochlorothiazide (PRINZIDE,ZESTORETIC) 20-12.5 MG tablet she states that Medicaid won't refill the prescription until Friday with out a new prescription called in.  CB# [phone]

## 2016-10-11 DIAGNOSIS — J45909 Unspecified asthma, uncomplicated: Secondary | ICD-10-CM | POA: Insufficient documentation

## 2016-10-11 DIAGNOSIS — W57XXXA Bitten or stung by nonvenomous insect and other nonvenomous arthropods, initial encounter: Secondary | ICD-10-CM | POA: Insufficient documentation

## 2016-10-11 DIAGNOSIS — S80862A Insect bite (nonvenomous), left lower leg, initial encounter: Secondary | ICD-10-CM | POA: Diagnosis present

## 2016-10-11 DIAGNOSIS — F17290 Nicotine dependence, other tobacco product, uncomplicated: Secondary | ICD-10-CM | POA: Insufficient documentation

## 2016-10-11 DIAGNOSIS — Y929 Unspecified place or not applicable: Secondary | ICD-10-CM | POA: Diagnosis not present

## 2016-10-11 DIAGNOSIS — Y939 Activity, unspecified: Secondary | ICD-10-CM | POA: Insufficient documentation

## 2016-10-11 DIAGNOSIS — Y999 Unspecified external cause status: Secondary | ICD-10-CM | POA: Diagnosis not present

## 2016-10-11 DIAGNOSIS — S80861A Insect bite (nonvenomous), right lower leg, initial encounter: Secondary | ICD-10-CM | POA: Insufficient documentation

## 2016-10-12 ENCOUNTER — Emergency Department (HOSPITAL_COMMUNITY)
Admission: EM | Admit: 2016-10-12 | Discharge: 2016-10-12 | Disposition: A | Discharge status: Home / Self Care | Payer: Medicaid Other | Attending: Emergency Medicine | Admitting: Emergency Medicine

## 2016-10-12 ENCOUNTER — Encounter (HOSPITAL_COMMUNITY): Payer: Self-pay | Admitting: *Deleted

## 2016-10-12 DIAGNOSIS — W57XXXA Bitten or stung by nonvenomous insect and other nonvenomous arthropods, initial encounter: Secondary | ICD-10-CM

## 2016-10-12 DIAGNOSIS — L0390 Cellulitis, unspecified: Secondary | ICD-10-CM

## 2016-10-12 DIAGNOSIS — T7840XA Allergy, unspecified, initial encounter: Secondary | ICD-10-CM

## 2016-10-12 MED ORDER — CEPHALEXIN 500 MG PO CAPS: 500.0000 mg | ORAL_CAPSULE | Freq: Two times a day (BID) | ORAL | 0 refills | Status: DC

## 2016-10-12 MED ORDER — CEPHALEXIN 250 MG PO CAPS
500.0000 mg | ORAL_CAPSULE | Freq: Once | ORAL | Status: AC
Start: 2016-10-12 — End: 2016-10-12
  Administered 2016-10-12: 500 mg via ORAL
  Filled 2016-10-12: qty 2

## 2016-10-12 MED ORDER — HYDROCORTISONE 1 % EX CREA
TOPICAL_CREAM | Freq: Two times a day (BID) | CUTANEOUS | Status: DC
  Administered 2016-10-12: 04:00:00 via TOPICAL
  Filled 2016-10-12: qty 28

## 2016-10-12 MED ORDER — DIPHENHYDRAMINE HCL 25 MG PO TABS: 25.0000 mg | ORAL_TABLET | Freq: Four times a day (QID) | ORAL | 0 refills | Status: DC

## 2016-10-12 NOTE — ED Triage Notes (Signed)
The pt has had an insect bite or a rash on the calfs of her legs since yesterday  Today there is pain and swelling in both legs  lmp none mirana

## 2016-10-12 NOTE — ED Provider Notes (Signed)
[facility]-EMERGENCY DEPT Provider Note   CSN: [id number] Arrival date & time: 10/11/16  2330  History   Chief Complaint Chief Complaint  Patient presents with  . Insect Bite   HPI Lacey Whitaker is a 30 y.o. female.  HPI   Patient reports being bit by bugs (unknown) two days ago and as the past two days have gone on the bites have become itchy, painful, enlarged, tender, and red. She also noticed her legs have started to swell and this caused her to come get evaluated. She has not tried anything OTC for relief. She has not had fevers, N/V/D, weakness, headache, diffuse hives. No CP or SOB  Past Medical History:  Diagnosis Date  . Asthma   . Cyst, mediastinum    pt. reports having the cyst removed last year and has reoccured  . Iron deficiency anemia due to chronic blood loss   . Morbid obesity (HCC)    s/p gastric bypass surgery in 2009  . Pregnancy induced hypertension    2006    Patient Active Problem List   Diagnosis Date Noted  . Asthma, chronic   . Iron deficiency anemia due to chronic blood loss   . Obesities, morbid (HCC) 08/10/2011    Past Surgical History:  Procedure Laterality Date  . BONE CYST EXCISION  2010  . CESAREAN SECTION    . CESAREAN SECTION  09/02/2011   Procedure: CESAREAN SECTION;  Surgeon: Purcell NailsAngela Y Roberts, MD;  Location: [facility];  Service: Gynecology;  Laterality: N/A;  . GASTRIC BYPASS  05/2008  . GASTRIC BYPASS  2009  . WISDOM TOOTH EXTRACTION  09/2010    OB History    Gravida Para Term Preterm AB Living   3 3 3     3    SAB TAB Ectopic Multiple Live Births           2       Home Medications    Prior to Admission medications   Medication Sig Start Date End Date Taking? Authorizing Provider  cephALEXin (KEFLEX) 500 MG capsule Take 1 capsule (500 mg total) by mouth 2 (two) times daily. 10/12/16   Jadarious Dobbins Neva SeatGreene, PA-C  diltiazem 2 % GEL Apply 1 application topically 3 (three) times daily. 05/11/16   Donita BrooksWarren T Pickard, MD  diphenhydrAMINE  (BENADRYL) 25 MG tablet Take 1 tablet (25 mg total) by mouth every 6 (six) hours. 10/12/16   Marlon Peliffany Jamie Hafford, PA-C  levonorgestrel (MIRENA) 20 MCG/24HR IUD 1 each by Intrauterine route once.    Historical Provider, MD  lisinopril-hydrochlorothiazide (PRINZIDE,ZESTORETIC) 20-12.5 MG tablet Take 2 tablets by mouth daily. 06/06/16   Donita BrooksWarren T Pickard, MD    Family History Family History  Problem Relation Age of Onset  . Asthma Mother   . Asthma Brother     Social History Social History  Substance Use Topics  . Smoking status: Current Some Day Smoker    Types: Cigars  . Smokeless tobacco: Never Used     Comment: Smokes 2 Black & Milds daily  . Alcohol use Yes     Allergies   Patient has no known allergies.   Review of Systems Review of Systems  Review of Systems All other systems negative except as documented in the HPI. All pertinent positives and negatives as reviewed in the HPI.  Physical Exam Updated Vital Signs BP 134/67   Pulse 89   Temp 98.4 F (36.9 C) (Oral)   Resp 17   Ht 5\' 7"  (1.702 m)  Wt (!) 163.3 kg   SpO2 100%   BMI 56.38 kg/m   Physical Exam  Constitutional: She appears well-developed and well-nourished. No distress.  HENT:  Head: Normocephalic and atraumatic.  Eyes: Pupils are equal, round, and reactive to light.  Neck: Normal range of motion. Neck supple.  Cardiovascular: Normal rate and regular rhythm.   Pulmonary/Chest: Effort normal.  Abdominal: Soft.  Neurological: She is alert.  Skin: Skin is warm and dry. Rash noted.     Nursing note and vitals reviewed.    ED Treatments / Results  Labs (all labs ordered are listed, but only abnormal results are displayed) Labs Reviewed - No data to display  EKG  EKG Interpretation None       Radiology No results found.  Procedures Procedures (including critical care time)  Medications Ordered in ED Medications  hydrocortisone cream 1 % (not administered)  cephALEXin (KEFLEX)  capsule 500 mg (not administered)     Initial Impression / Assessment and Plan / ED Course  I have reviewed the triage vital signs and the nursing notes.  Pertinent labs & imaging results that were available during my care of the patient were reviewed by me and considered in my medical decision making (see chart for details).  Clinical Course     Symptoms are likely more allergic but to cover for possible cellulitis will discharge with keflex, benadryl and hydrocortisone cream for treatment. Follow up with PCP in 2-3 days. NO systemic symptoms.  Final Clinical Impressions(s) / ED Diagnoses   Final diagnoses:  Insect bite, initial encounter  Cellulitis, unspecified cellulitis site  Allergic reaction, initial encounter    New Prescriptions New Prescriptions   CEPHALEXIN (KEFLEX) 500 MG CAPSULE    Take 1 capsule (500 mg total) by mouth 2 (two) times daily.   DIPHENHYDRAMINE (BENADRYL) 25 MG TABLET    Take 1 tablet (25 mg total) by mouth every 6 (six) hours.     Marlon Peliffany Coran Dipaola, PA-C 10/12/16 [id number]    Glynn OctaveStephen Rancour, MD 10/12/16 [phone]

## 2016-10-12 NOTE — ED Notes (Signed)
Pt has blisters and red areas on the posterior of her calves bilaterally. Warm and dry to the touch.

## 2016-12-25 ENCOUNTER — Other Ambulatory Visit: Payer: Self-pay | Admitting: Family Medicine

## 2017-01-15 ENCOUNTER — Ambulatory Visit (HOSPITAL_COMMUNITY)
Admission: EM | Admit: 2017-01-15 | Discharge: 2017-01-15 | Disposition: A | Discharge status: Home / Self Care | Payer: Medicaid Other | Attending: Family Medicine | Admitting: Family Medicine

## 2017-01-15 ENCOUNTER — Encounter (HOSPITAL_COMMUNITY): Payer: Self-pay | Admitting: Emergency Medicine

## 2017-01-15 DIAGNOSIS — H1032 Unspecified acute conjunctivitis, left eye: Secondary | ICD-10-CM | POA: Diagnosis not present

## 2017-01-15 MED ORDER — POLYMYXIN B-TRIMETHOPRIM 10000-0.1 UNIT/ML-% OP SOLN: 1.0000 [drp] | OPHTHALMIC | 0 refills | Status: DC

## 2017-01-15 NOTE — ED Provider Notes (Signed)
CSN: [id number]     Arrival date & time 01/15/17  1609 History   First MD Initiated Contact with Patient 01/15/17 1807     Chief Complaint  Patient presents with  . Conjunctivitis    left eye   (Consider location/radiation/quality/duration/timing/severity/associated sxs/prior Treatment) 31 year old female complaining of left itchy watery eye for 4 days. She was sent home from work stating that she need to see a doctor to get a prescription before she comes back to work.      Past Medical History:  Diagnosis Date  . Asthma   . Cyst, mediastinum    pt. reports having the cyst removed last year and has reoccured  . Iron deficiency anemia due to chronic blood loss   . Morbid obesity (HCC)    s/p gastric bypass surgery in 2009  . Pregnancy induced hypertension    2006   Past Surgical History:  Procedure Laterality Date  . BONE CYST EXCISION  2010  . CESAREAN SECTION    . CESAREAN SECTION  09/02/2011   Procedure: CESAREAN SECTION;  Surgeon: Purcell Nails, MD;  Location: [facility];  Service: Gynecology;  Laterality: N/A;  . GASTRIC BYPASS  05/2008  . GASTRIC BYPASS  2009  . WISDOM TOOTH EXTRACTION  09/2010   Family History  Problem Relation Age of Onset  . Asthma Mother   . Asthma Brother    Social History  Substance Use Topics  . Smoking status: Current Some Day Smoker    Types: Cigars  . Smokeless tobacco: Never Used     Comment: Smokes 2 Black & Milds daily  . Alcohol use Yes   OB History    Gravida Para Term Preterm AB Living   3 3 3     3    SAB TAB Ectopic Multiple Live Births           2     Review of Systems  Constitutional: Negative.   HENT: Negative.   Eyes: Positive for discharge, redness and itching.  Respiratory: Negative.   All other systems reviewed and are negative.   Allergies  Patient has no known allergies.  Home Medications   Prior to Admission medications   Medication Sig Start Date End Date Taking? Authorizing Provider   levonorgestrel (MIRENA) 20 MCG/24HR IUD 1 each by Intrauterine route once.   Yes Historical Provider, MD  lisinopril-hydrochlorothiazide (PRINZIDE,ZESTORETIC) 20-12.5 MG tablet TAKE 2 TABLETS BY MOUTH EVERY DAY. 12/25/16  Yes Donita Brooks, MD  diphenhydrAMINE (BENADRYL) 25 MG tablet Take 1 tablet (25 mg total) by mouth every 6 (six) hours. 10/12/16   Marlon Pel, PA-C  trimethoprim-polymyxin b (POLYTRIM) ophthalmic solution Place 1 drop into the left eye every 4 (four) hours. 01/15/17   Hayden Rasmussen, NP   Meds Ordered and Administered this Visit  Medications - No data to display  BP 121/58 (BP Location: Right Wrist)   Pulse 92   Temp 99 F (37.2 C) (Oral)   SpO2 100%  No data found.   Physical Exam  Constitutional: She is oriented to person, place, and time. She appears well-developed and well-nourished.  Eyes: EOM are normal. Pupils are equal, round, and reactive to light. Right eye exhibits no discharge. Left eye exhibits no discharge.  Mild conjunctival erythema and swelling of the left eye. Minor scleral injection. Anterior chamber is clear. No foreign bodies are seen.  Neck: Normal range of motion. Neck supple.  Neurological: She is alert and oriented to person, place, and time.  Skin: Skin is warm and dry.  Nursing note and vitals reviewed.   Urgent Care Course     Procedures (including critical care time)  Labs Review Labs Reviewed - No data to display  Imaging Review No results found.   Visual Acuity Review  Right Eye Distance:   Left Eye Distance:   Bilateral Distance:    Right Eye Near:   Left Eye Near:    Bilateral Near:         MDM   1. Acute conjunctivitis of left eye, unspecified acute conjunctivitis type    Use the eyedrops as directed. Warm compresses frequently. Wash her hands frequently. Meds ordered this encounter  Medications  . trimethoprim-polymyxin b (POLYTRIM) ophthalmic solution    Sig: Place 1 drop into the left eye every 4  (four) hours.    Dispense:  10 mL    Refill:  0    Order Specific Question:   Supervising Provider    Answer:   Linna HoffKINDL, JAMES D [[id number]]  zaditor eye drops     Hayden Rasmussenavid Iridessa Harrow, NP 01/15/17 1821

## 2017-01-15 NOTE — Discharge Instructions (Signed)
Use the eyedrops as directed. Warm compresses frequently. Wash her hands frequently.

## 2017-01-15 NOTE — ED Triage Notes (Signed)
Pt has been suffering from drainage and pain in her left eye since Friday.  She denies any fever.

## 2017-01-19 ENCOUNTER — Encounter: Payer: Self-pay | Admitting: Family Medicine

## 2017-01-19 ENCOUNTER — Ambulatory Visit (INDEPENDENT_AMBULATORY_CARE_PROVIDER_SITE_OTHER): Payer: Medicaid Other | Admitting: Family Medicine

## 2017-01-19 VITALS — BP 130/82 | HR 82 | Temp 98.2°F | Resp 18 | Ht 67.0 in | Wt 353.0 lb

## 2017-01-19 DIAGNOSIS — H1033 Unspecified acute conjunctivitis, bilateral: Secondary | ICD-10-CM | POA: Diagnosis not present

## 2017-01-19 MED ORDER — TOBRAMYCIN-DEXAMETHASONE 0.3-0.1 % OP SUSP: 2.0000 [drp] | Freq: Four times a day (QID) | OPHTHALMIC | 0 refills | Status: DC

## 2017-01-19 MED ORDER — HYDROCORTISONE ACETATE 25 MG RE SUPP: 25.0000 mg | Freq: Two times a day (BID) | RECTAL | 0 refills | Status: DC

## 2017-01-19 NOTE — Progress Notes (Signed)
Subjective:    Patient ID: Lacey Whitaker, female    DOB: June 03, 1986, 31 y.o.   MRN: [id number]  HPI Patient has had pink eye for 2 weeks. Several members of her family have had it she was last Checked. Both eyes are pink and injected with a mucous/watery discharge. She feels like there is grid are seen in her eyes. She denies any blurry vision or pain. She has to be on drops to go back to work. However her symptoms appear to be more viral. She also has pain with defecation. She is on her menstrual cycle and so she refuses a rectal exam out of embarrassment. She would like to come back in a week or so after her menstrual cycle to allow me to evaluate. However she reports itching and burning with defecation and a tearing pain with defecation. She denies any perirectal masses. Differential diagnosis includes internal hemorrhoid versus anal fissure Past Medical History:  Diagnosis Date  . Asthma   . Cyst, mediastinum    pt. reports having the cyst removed last year and has reoccured  . Iron deficiency anemia due to chronic blood loss   . Morbid obesity (HCC)    s/p gastric bypass surgery in 2009  . Pregnancy induced hypertension    2006   Past Surgical History:  Procedure Laterality Date  . BONE CYST EXCISION  2010  . CESAREAN SECTION    . CESAREAN SECTION  09/02/2011   Procedure: CESAREAN SECTION;  Surgeon: Purcell Nails, MD;  Location: [facility];  Service: Gynecology;  Laterality: N/A;  . GASTRIC BYPASS  05/2008  . GASTRIC BYPASS  2009  . WISDOM TOOTH EXTRACTION  09/2010   Current Outpatient Prescriptions on File Prior to Visit  Medication Sig Dispense Refill  . diphenhydrAMINE (BENADRYL) 25 MG tablet Take 1 tablet (25 mg total) by mouth every 6 (six) hours. 20 tablet 0  . levonorgestrel (MIRENA) 20 MCG/24HR IUD 1 each by Intrauterine route once.    Marland Kitchen lisinopril-hydrochlorothiazide (PRINZIDE,ZESTORETIC) 20-12.5 MG tablet TAKE 2 TABLETS BY MOUTH EVERY DAY. 180 tablet 0  .  trimethoprim-polymyxin b (POLYTRIM) ophthalmic solution Place 1 drop into the left eye every 4 (four) hours. 10 mL 0  . [DISCONTINUED] Ferrous Sulfate Dried (FEOSOL) 200 (65 FE) MG TABS Take 1 tablet by mouth 2 (two) times daily. (Patient not taking: Reported on 08/05/2015) 60 tablet 5  . [DISCONTINUED] phentermine 37.5 MG capsule Take 1 capsule (37.5 mg total) by mouth every morning. 30 capsule 2   No current facility-administered medications on file prior to visit.    No Known Allergies Social History   Social History  . Marital status: Single    Spouse name: N/A  . Number of children: N/A  . Years of education: N/A   Occupational History  . Not on file.   Social History Main Topics  . Smoking status: Current Some Day Smoker    Types: Cigars  . Smokeless tobacco: Never Used     Comment: Smokes 2 Black & Milds daily  . Alcohol use Yes  . Drug use: No  . Sexual activity: Yes    Birth control/ protection: IUD   Other Topics Concern  . Not on file   Social History Narrative   Works at [employer].   3 children: Ages 21,6,2 y/o    Lives with her Mom, her brother, and her 3 children.          Review of Systems  All  other systems reviewed and are negative.      Objective:   Physical Exam  Eyes: Right conjunctiva is injected. Left conjunctiva is injected.  Cardiovascular: Normal rate, regular rhythm and normal heart sounds.   Pulmonary/Chest: Effort normal and breath sounds normal. No respiratory distress. She has no wheezes. She has no rales.  Vitals reviewed.         Assessment & Plan:  Acute conjunctivitis of both eyes, unspecified acute conjunctivitis type - Plan: tobramycin-dexamethasone (TOBRADEX) ophthalmic solution  Splint to the patient that more than likely she has bilateral conjunctivitis. This should get better over the weekend on its own. If her boss insisted on antibiotic drops I would recommend TobraDex 2 drops every 6 hours as needed mainly  because I believe the steroids may help calm down the irritation in her eye. I will give the patient prescription for Anusol suppositories twice a day for 7 days to treat possible internal hemorrhoids almost impossible to tell when I'm examining the patient. I would like her to return in one week so that I can examine the area after her menstrual cycle per her request to rule out other abnormalities in that area.

## 2017-02-02 ENCOUNTER — Ambulatory Visit: Payer: Medicaid Other | Admitting: Family Medicine

## 2017-02-23 ENCOUNTER — Encounter: Payer: Self-pay | Admitting: Family Medicine

## 2017-02-23 ENCOUNTER — Ambulatory Visit (INDEPENDENT_AMBULATORY_CARE_PROVIDER_SITE_OTHER): Payer: Medicaid Other | Admitting: Family Medicine

## 2017-02-23 VITALS — BP 130/80 | HR 93 | Temp 98.0°F | Resp 16 | Wt 347.0 lb

## 2017-02-23 DIAGNOSIS — D508 Other iron deficiency anemias: Secondary | ICD-10-CM

## 2017-02-23 DIAGNOSIS — K6289 Other specified diseases of anus and rectum: Secondary | ICD-10-CM | POA: Diagnosis not present

## 2017-02-23 LAB — CBC WITH DIFFERENTIAL/PLATELET
BASOS PCT: 1 %
Basophils Absolute: 62 cells/uL (ref 0–200)
EOS ABS: 310 {cells}/uL (ref 15–500)
EOS PCT: 5 %
HCT: 33.5 % — ABNORMAL LOW (ref 35.0–45.0)
Hemoglobin: 10.2 g/dL — ABNORMAL LOW (ref 12.0–15.0)
LYMPHS PCT: 30 %
Lymphs Abs: 1860 cells/uL (ref 850–3900)
MCH: 22 pg — ABNORMAL LOW (ref 27.0–33.0)
MCHC: 30.4 g/dL — ABNORMAL LOW (ref 32.0–36.0)
MCV: 72.2 fL — AB (ref 80.0–100.0)
MONOS PCT: 8 %
MPV: 9.2 fL (ref 7.5–12.5)
Monocytes Absolute: 496 cells/uL (ref 200–950)
Neutro Abs: 3472 cells/uL (ref 1500–7800)
Neutrophils Relative %: 56 %
PLATELETS: 368 10*3/uL (ref 140–400)
RBC: 4.64 MIL/uL (ref 3.80–5.10)
RDW: 18.5 % — AB (ref 11.0–15.0)
WBC: 6.2 10*3/uL (ref 3.8–10.8)

## 2017-02-23 MED ORDER — PHENTERMINE HCL 37.5 MG PO CAPS: 37.5000 mg | ORAL_CAPSULE | ORAL | 2 refills | Status: DC

## 2017-02-23 NOTE — Progress Notes (Signed)
Subjective:    Patient ID: Lacey Whitaker, female    DOB: 1986/08/18, 31 y.o.   MRN: [id number]  HPI The patient is a very pleasant 31 year old African-American female. The last time I saw her I started her on Anusol suppositories for what I thought might be a hemorrhoid issues seem bright red blood per rectum. At that time we did not perform an exam because the patient was on her menstrual cycle and she preferred to wait. She was unable to tolerate the suppositories. Therefore she did not use them. However the bleeding has completely subsided. Unfortunate she continues to report almost a tearing pain in her rectum every time she has a bowel movement. On examination today there is no evidence of an external hemorrhoid. There is no perirectal abscess. On internal rectal exam, the patient has a very tight anal sphincter. Rectal exam elicits a tremendous amount of pain. I am able to palpate an area that reproduces her pain. In that area, there is no palpable mass. However visually I do not see any abnormality. I suspect the patient has an anal fissure. She is also overdue to recheck a CBC. She has a history of iron deficiency anemia secondary to dysfunctional uterine bleeding. The uterine bleeding has subsided after the patient had an IUD. However she was unable to tolerate iron pills Past Medical History:  Diagnosis Date  . Asthma   . Cyst, mediastinum    pt. reports having the cyst removed last year and has reoccured  . Iron deficiency anemia due to chronic blood loss   . Morbid obesity (HCC)    s/p gastric bypass surgery in 2009  . Pregnancy induced hypertension    2006   Past Surgical History:  Procedure Laterality Date  . BONE CYST EXCISION  2010  . CESAREAN SECTION    . CESAREAN SECTION  09/02/2011   Procedure: CESAREAN SECTION;  Surgeon: Purcell Nails, MD;  Location: [facility];  Service: Gynecology;  Laterality: N/A;  . GASTRIC BYPASS  05/2008  . GASTRIC BYPASS  2009  . WISDOM TOOTH  EXTRACTION  09/2010   Current Outpatient Prescriptions on File Prior to Visit  Medication Sig Dispense Refill  . diphenhydrAMINE (BENADRYL) 25 MG tablet Take 1 tablet (25 mg total) by mouth every 6 (six) hours. 20 tablet 0  . levonorgestrel (MIRENA) 20 MCG/24HR IUD 1 each by Intrauterine route once.    Marland Kitchen lisinopril-hydrochlorothiazide (PRINZIDE,ZESTORETIC) 20-12.5 MG tablet TAKE 2 TABLETS BY MOUTH EVERY DAY. 180 tablet 0  . [DISCONTINUED] Ferrous Sulfate Dried (FEOSOL) 200 (65 FE) MG TABS Take 1 tablet by mouth 2 (two) times daily. (Patient not taking: Reported on 08/05/2015) 60 tablet 5   No current facility-administered medications on file prior to visit.    No Known Allergies Social History   Social History  . Marital status: Single    Spouse name: N/A  . Number of children: N/A  . Years of education: N/A   Occupational History  . Not on file.   Social History Main Topics  . Smoking status: Current Some Day Smoker    Types: Cigars  . Smokeless tobacco: Never Used     Comment: Smokes 2 Black & Milds daily  . Alcohol use Yes  . Drug use: No  . Sexual activity: Yes    Birth control/ protection: IUD   Other Topics Concern  . Not on file   Social History Narrative   Works at [employer].   3 children:  Ages 19,6,2 y/o    Lives with her Mom, her brother, and her 3 children.          Review of Systems  All other systems reviewed and are negative.      Objective:   Physical Exam  Constitutional: She appears well-developed and well-nourished.  Cardiovascular: Normal rate, regular rhythm and normal heart sounds.   Pulmonary/Chest: Effort normal and breath sounds normal. No respiratory distress. She has no wheezes. She has no rales.  Abdominal: Soft. Bowel sounds are normal. She exhibits no distension. There is no tenderness. There is no rebound and no guarding.  Genitourinary: Rectal exam shows tenderness and anal tone abnormal. Rectal exam shows no external  hemorrhoid, no internal hemorrhoid, no mass and guaiac negative stool.  Vitals reviewed.         Assessment & Plan:  Iron deficiency anemia secondary to inadequate dietary iron intake - Plan: CBC with Differential/Platelet, COMPLETE METABOLIC PANEL WITH GFR, Iron  Pain, rectal  Suspect that the patient has an anal fissure based on the level of pain that she is having and the normal exam. Begin nitroglycerin ointment 4% applied twice a day for the next 4-8 weeks. If symptoms persist, I would recommend a general surgery consultation. Also recheck the patient's CBC and iron levels to follow-up on her iron deficiency anemia. She is requesting medication to help with weight loss. If her anemia has resolved, the patient can try Adipex 37.5 mg by mouth every morning for 90 days

## 2017-02-24 LAB — COMPLETE METABOLIC PANEL WITH GFR
ALK PHOS: 71 U/L (ref 33–115)
ALT: 26 U/L (ref 6–29)
AST: 26 U/L (ref 10–30)
Albumin: 3.4 g/dL — ABNORMAL LOW (ref 3.6–5.1)
BUN: 10 mg/dL (ref 7–25)
CHLORIDE: 104 mmol/L (ref 98–110)
CO2: 27 mmol/L (ref 20–31)
Calcium: 8.8 mg/dL (ref 8.6–10.2)
Creat: 0.95 mg/dL (ref 0.50–1.10)
GFR, EST NON AFRICAN AMERICAN: 80 mL/min (ref >=60)
GLUCOSE: 83 mg/dL (ref 70–99)
Potassium: 4.4 mmol/L (ref 3.5–5.3)
SODIUM: 139 mmol/L (ref 135–146)
Total Bilirubin: 0.3 mg/dL (ref 0.2–1.2)
Total Protein: 6.8 g/dL (ref 6.1–8.1)

## 2017-02-24 LAB — IRON: IRON: 30 ug/dL — AB (ref 40–190)

## 2017-03-08 ENCOUNTER — Encounter: Payer: Self-pay | Admitting: Family Medicine

## 2017-03-15 ENCOUNTER — Telehealth: Payer: Self-pay | Admitting: *Deleted

## 2017-03-15 NOTE — Telephone Encounter (Signed)
Received call from patient about lab results.   Notes recorded by Donita Brooks, MD on 02/26/2017 at 7:09 AM EDT Iron level is still low but hgb is much better. Still anemic but not as severe. Why can she not take iron again? What is her problem with iron tabs? Could she try liquid iron supplement?  Patient states that oral iron tablets gave her severe constipation.   MD please advise.

## 2017-03-16 MED ORDER — POLYETHYLENE GLYCOL 3350 17 GM/SCOOP PO POWD
17.0000 g | Freq: Two times a day (BID) | ORAL | 1 refills | Status: DC | PRN
Start: 2017-03-16 — End: 2018-07-12

## 2017-03-16 MED ORDER — FERROUS SULFATE 300 (60 FE) MG/5ML PO SYRP: 300.0000 mg | ORAL_SOLUTION | Freq: Every day | ORAL | 3 refills | Status: DC

## 2017-03-16 NOTE — Telephone Encounter (Signed)
Try taking ferrous sulfate syrup 300 mg/24mL 1 tsp poqday and take with miralax everyday.

## 2017-03-16 NOTE — Telephone Encounter (Signed)
Call placed to patient and patient made aware per VM.   Prescription sent to pharmacy.  

## 2017-03-21 ENCOUNTER — Telehealth: Payer: Self-pay | Admitting: Family Medicine

## 2017-03-21 NOTE — Telephone Encounter (Signed)
Pt's ins will not cover the iron syrup and there is nothing in liquid form that they can make per [facility] however they do have iron drops OTC but she would have to take a lot to equal  of iron. Recommendations?

## 2017-03-22 NOTE — Telephone Encounter (Signed)
Consult hem/onc for possible iron transfusions.

## 2017-03-22 NOTE — Telephone Encounter (Signed)
LMTRC

## 2017-03-30 NOTE — Telephone Encounter (Signed)
Tried to call pt no answer and memory is full - no message left.

## 2017-04-02 ENCOUNTER — Other Ambulatory Visit: Payer: Self-pay | Admitting: Family Medicine

## 2017-04-04 NOTE — Telephone Encounter (Signed)
LMTRC

## 2017-04-09 ENCOUNTER — Encounter: Payer: Self-pay | Admitting: Family Medicine

## 2017-04-09 NOTE — Telephone Encounter (Signed)
Letter mailed to pt to call office if she would like for us to set appt with hemo. (see letter)  Referral not entered until further contact with pt.

## 2017-05-15 ENCOUNTER — Encounter (HOSPITAL_COMMUNITY): Payer: Self-pay | Admitting: *Deleted

## 2017-05-15 ENCOUNTER — Emergency Department (HOSPITAL_COMMUNITY): Payer: Medicaid Other

## 2017-05-15 DIAGNOSIS — J45909 Unspecified asthma, uncomplicated: Secondary | ICD-10-CM | POA: Insufficient documentation

## 2017-05-15 DIAGNOSIS — F17210 Nicotine dependence, cigarettes, uncomplicated: Secondary | ICD-10-CM | POA: Diagnosis not present

## 2017-05-15 DIAGNOSIS — R079 Chest pain, unspecified: Secondary | ICD-10-CM | POA: Diagnosis present

## 2017-05-15 DIAGNOSIS — J209 Acute bronchitis, unspecified: Secondary | ICD-10-CM | POA: Insufficient documentation

## 2017-05-15 DIAGNOSIS — Z79899 Other long term (current) drug therapy: Secondary | ICD-10-CM | POA: Diagnosis not present

## 2017-05-15 DIAGNOSIS — R0602 Shortness of breath: Secondary | ICD-10-CM | POA: Insufficient documentation

## 2017-05-15 LAB — BASIC METABOLIC PANEL
Anion gap: 6 (ref 5–15)
BUN: 8 mg/dL (ref 6–20)
CHLORIDE: 102 mmol/L (ref 101–111)
CO2: 27 mmol/L (ref 22–32)
CREATININE: 1.01 mg/dL — AB (ref 0.44–1.00)
Calcium: 8.3 mg/dL — ABNORMAL LOW (ref 8.9–10.3)
Glucose, Bld: 96 mg/dL (ref 65–99)
Potassium: 3.6 mmol/L (ref 3.5–5.1)
SODIUM: 135 mmol/L (ref 135–145)

## 2017-05-15 LAB — CBC
HCT: 32.2 % — ABNORMAL LOW (ref 36.0–46.0)
Hemoglobin: 10 g/dL — ABNORMAL LOW (ref 12.0–15.0)
MCH: 22.4 pg — ABNORMAL LOW (ref 26.0–34.0)
MCHC: 31.1 g/dL (ref 30.0–36.0)
MCV: 72.2 fL — AB (ref 78.0–100.0)
PLATELETS: 321 10*3/uL (ref 150–400)
RBC: 4.46 MIL/uL (ref 3.87–5.11)
RDW: 18.3 % — ABNORMAL HIGH (ref 11.5–15.5)
WBC: 5.1 10*3/uL (ref 4.0–10.5)

## 2017-05-15 NOTE — ED Triage Notes (Signed)
Pt has c/o centralized CP that radiates to the back. Pt states she has been coughing "for a few days" and is afraid she has pneumonia.

## 2017-05-16 ENCOUNTER — Emergency Department (HOSPITAL_COMMUNITY)
Admission: EM | Admit: 2017-05-16 | Discharge: 2017-05-16 | Disposition: A | Discharge status: Home / Self Care | Payer: Medicaid Other | Attending: Emergency Medicine | Admitting: Emergency Medicine

## 2017-05-16 DIAGNOSIS — J209 Acute bronchitis, unspecified: Secondary | ICD-10-CM

## 2017-05-16 LAB — I-STAT TROPONIN, ED: Troponin i, poc: 0 ng/mL (ref 0.00–0.08)

## 2017-05-16 MED ORDER — IPRATROPIUM-ALBUTEROL 0.5-2.5 (3) MG/3ML IN SOLN
3.0000 mL | Freq: Once | RESPIRATORY_TRACT | Status: AC
  Administered 2017-05-16: 3 mL via RESPIRATORY_TRACT
  Filled 2017-05-16: qty 3

## 2017-05-16 MED ORDER — DEXAMETHASONE 4 MG PO TABS
10.0000 mg | ORAL_TABLET | Freq: Once | ORAL | Status: AC
  Administered 2017-05-16: 10 mg via ORAL
  Filled 2017-05-16: qty 3

## 2017-05-16 MED ORDER — ALBUTEROL SULFATE HFA 108 (90 BASE) MCG/ACT IN AERS
2.0000 | INHALATION_SPRAY | RESPIRATORY_TRACT | Status: DC | PRN
  Filled 2017-05-16: qty 6.7

## 2017-05-16 NOTE — Discharge Instructions (Signed)
Do not smoke!  If you take any over-the-counter cough medicine, take something with dextromethorphan in it. It will have "DM" as part of it's name.

## 2017-05-16 NOTE — ED Provider Notes (Signed)
[facility]-EM[facility]ENCY DEPT Provider Note   CSN: [id number] Arrival date & time: 05/15/17  2247  By signing my name below, I, Diona Browner, attest that this documentation has been prepared under the direction and in the presence of Dione Booze, MD. Electronically Signed: Diona Browner, ED Scribe. 05/16/17. 1:25 AM.  History   Chief Complaint Chief Complaint  Patient presents with  . Chest Pain  . Shortness of Breath    HPI Lacey Whitaker is a 31 y.o. female who presents to the Emergency Department complaining of a non productive cough that started Saturday, 05/12/17. Associated sx include SOB, CP that radiates to her back, chills, and diaphoresis. Pt has tried Theraflu and drinking hot tea with mild relief. Pt's son has ben sick. Smokes black and mild's occasionally. Pt denies fever.  The history is provided by the patient. No language interpreter was used.    Past Medical History:  Diagnosis Date  . Asthma   . Cyst, mediastinum    pt. reports having the cyst removed last year and has reoccured  . Iron deficiency anemia due to chronic blood loss   . Morbid obesity (HCC)    s/p gastric bypass surgery in 2009  . Pregnancy induced hypertension    2006    Patient Active Problem List   Diagnosis Date Noted  . Asthma, chronic   . Iron deficiency anemia due to chronic blood loss   . Obesities, morbid (HCC) 08/10/2011    Past Surgical History:  Procedure Laterality Date  . BONE CYST EXCISION  2010  . CESAREAN SECTION    . CESAREAN SECTION  09/02/2011   Procedure: CESAREAN SECTION;  Surgeon: Purcell Nails, MD;  Location: [facility];  Service: Gynecology;  Laterality: N/A;  . GASTRIC BYPASS  05/2008  . GASTRIC BYPASS  2009  . WISDOM TOOTH EXTRACTION  09/2010    OB History    Gravida Para Term Preterm AB Living   3 3 3     3    SAB TAB Ectopic Multiple Live Births           2       Home Medications    Prior to Admission medications   Medication Sig Start Date End Date  Taking? Authorizing Provider  diphenhydrAMINE (BENADRYL) 25 MG tablet Take 1 tablet (25 mg total) by mouth every 6 (six) hours. 10/12/16   Marlon Pel, PA-C  ferrous sulfate 300 (60 Fe) MG/5ML syrup Take 5 mLs (300 mg total) by mouth daily. 03/16/17   Salley Scarlet, MD  levonorgestrel (MIRENA) 20 MCG/24HR IUD 1 each by Intrauterine route once.    [provider]  lisinopril-hydrochlorothiazide (PRINZIDE,ZESTORETIC) 20-12.5 MG tablet TAKE 2 TABLETS BY MOUTH EVERY DAY. 04/02/17   Salley Scarlet, MD  phentermine 37.5 MG capsule Take 1 capsule (37.5 mg total) by mouth every morning. 02/23/17   Donita Brooks, MD  polyethylene glycol powder (GLYCOLAX/MIRALAX) powder Take 17 g by mouth 2 (two) times daily as needed. 03/16/17   Donita Brooks, MD    Family History Family History  Problem Relation Age of Onset  . Asthma Mother   . Asthma Brother     Social History Social History  Substance Use Topics  . Smoking status: Current Some Day Smoker    Types: Cigars  . Smokeless tobacco: Never Used     Comment: Smokes 2 Black & Milds daily  . Alcohol use Yes     Allergies   Patient has no known allergies.  Review of Systems Review of Systems  Constitutional: Positive for chills and diaphoresis. Negative for fever.  Respiratory: Positive for cough and shortness of breath.   Cardiovascular: Positive for chest pain.  Musculoskeletal: Positive for back pain.  All other systems reviewed and are negative.    Physical Exam Updated Vital Signs BP 129/69   Pulse 85   Temp 98.5 F (36.9 C) (Oral)   Resp 14   SpO2 99%   Physical Exam  Constitutional: She is oriented to person, place, and time. She appears well-developed and well-nourished.  HENT:  Head: Normocephalic and atraumatic.  Eyes: EOM are normal. Pupils are equal, round, and reactive to light.  Neck: Normal range of motion. Neck supple. No JVD present.  Cardiovascular: Normal rate, regular rhythm and  normal heart sounds.   No murmur heard. Pulmonary/Chest: Effort normal. She has wheezes. She has no rales. She exhibits tenderness.  Mild bilateral parasternal tenderness. Lungs prolonged expiratory phase. Mild wheezing at the apices.   Abdominal: Soft. Bowel sounds are normal. She exhibits no distension and no mass. There is no tenderness.  Musculoskeletal: Normal range of motion. She exhibits edema.  1-2+ pretibial edema.  Lymphadenopathy:    She has no cervical adenopathy.  Neurological: She is alert and oriented to person, place, and time. No cranial nerve deficit. She exhibits normal muscle tone. Coordination normal.  Skin: Skin is warm and dry. No rash noted.  Psychiatric: She has a normal mood and affect. Her behavior is normal. Judgment and thought content normal.  Nursing note and vitals reviewed.    ED Treatments / Results  DIAGNOSTIC STUDIES: Oxygen Saturation is 99% on RA, normal by my interpretation.   COORDINATION OF CARE: 1:25 AM-Discussed next steps with pt which includes a breathing treatment. Pt verbalized understanding and is agreeable with the plan.   Labs (all labs ordered are listed, but only abnormal results are displayed) Labs Reviewed  BASIC METABOLIC PANEL - Abnormal; Notable for the following:       Result Value   Creatinine, Ser 1.01 (*)    Calcium 8.3 (*)    All other components within normal limits  CBC - Abnormal; Notable for the following:    Hemoglobin 10.0 (*)    HCT 32.2 (*)    MCV 72.2 (*)    MCH 22.4 (*)    RDW 18.3 (*)    All other components within normal limits  Rosezena SensorI-STAT TROPOININ, ED   Radiology Dg Chest 2 View  Result Date: 05/15/2017 CLINICAL DATA:  Midchest pain radiating to the back. Cough. Duration 3 days. EXAM: CHEST  2 VIEW COMPARISON:  None. FINDINGS: The lungs are clear. The pulmonary vasculature is normal. Heart size is normal. Hilar and mediastinal contours are unremarkable. There is no pleural effusion. IMPRESSION: No  active cardiopulmonary disease. Electronically Signed   By: Ellery Plunkaniel R Mitchell M.D.   On: 05/15/2017 23:40   Procedures Procedures (including critical care time)  Medications Ordered in ED Medications  albuterol (PROVENTIL HFA;VENTOLIN HFA) 108 (90 Base) MCG/ACT inhaler 2 puff (not administered)  dexamethasone (DECADRON) tablet 10 mg (not administered)  ipratropium-albuterol (DUONEB) 0.5-2.5 (3) MG/3ML nebulizer solution 3 mL (3 mLs Nebulization Given 05/16/17 0152)     Initial Impression / Assessment and Plan / ED Course  I have reviewed the triage vital signs and the nursing notes.  Pertinent labs & imaging results that were available during my care of the patient were reviewed by me and considered in my medical decision making (see  chart for details).  Cough with evidence of bronchospasm. Chest x-ray shows no evidence of pneumonia. She is given a nebulizer treatment with albuterol and ipratropium with significant improvement in the cough. On reexam, lungs are clear. She's given a dose of dexamethasone and sent home with an albuterol inhaler. Old records are reviewed, and she has no relevant past visits.  Final Clinical Impressions(s) / ED Diagnoses   Final diagnoses:  Acute bronchitis, unspecified organism    New Prescriptions New Prescriptions   No medications on file   I personally performed the services described in this documentation, which was scribed in my presence. The recorded information has been reviewed and is accurate.      Dione Booze, MD 05/16/17 [phone]

## 2017-07-07 ENCOUNTER — Other Ambulatory Visit: Payer: Self-pay | Admitting: Family Medicine

## 2017-07-16 ENCOUNTER — Other Ambulatory Visit: Payer: Self-pay | Admitting: Family Medicine

## 2017-07-16 ENCOUNTER — Telehealth: Payer: Self-pay | Admitting: Family Medicine

## 2017-07-16 NOTE — Telephone Encounter (Signed)
Pt needs refill on lisinopril-hydrochlorothiazide, but cannot get it filled because it needs a prior auth, she uses [facility] [address].

## 2017-07-17 NOTE — Telephone Encounter (Signed)
Med refill sent to pharm on 07/16/17 and per [facility] formulary does not need PA or she may have meant prior aurth from the dr. Not sure?!?!

## 2017-10-16 ENCOUNTER — Other Ambulatory Visit: Payer: Self-pay | Admitting: Family Medicine

## 2017-10-29 ENCOUNTER — Other Ambulatory Visit: Payer: Self-pay

## 2017-10-29 ENCOUNTER — Encounter (HOSPITAL_COMMUNITY): Payer: Self-pay | Admitting: *Deleted

## 2017-10-29 ENCOUNTER — Emergency Department (HOSPITAL_COMMUNITY)
Admission: EM | Admit: 2017-10-29 | Discharge: 2017-10-29 | Disposition: A | Discharge status: Home / Self Care | Payer: Medicaid Other | Attending: Emergency Medicine | Admitting: Emergency Medicine

## 2017-10-29 DIAGNOSIS — F17290 Nicotine dependence, other tobacco product, uncomplicated: Secondary | ICD-10-CM | POA: Diagnosis not present

## 2017-10-29 DIAGNOSIS — J45909 Unspecified asthma, uncomplicated: Secondary | ICD-10-CM | POA: Insufficient documentation

## 2017-10-29 DIAGNOSIS — Z79899 Other long term (current) drug therapy: Secondary | ICD-10-CM | POA: Diagnosis not present

## 2017-10-29 DIAGNOSIS — L509 Urticaria, unspecified: Secondary | ICD-10-CM | POA: Diagnosis not present

## 2017-10-29 DIAGNOSIS — R21 Rash and other nonspecific skin eruption: Secondary | ICD-10-CM | POA: Diagnosis present

## 2017-10-29 MED ORDER — DEXAMETHASONE SODIUM PHOSPHATE 10 MG/ML IJ SOLN
10.0000 mg | Freq: Once | INTRAMUSCULAR | Status: AC
  Administered 2017-10-29: 10 mg via INTRAMUSCULAR
  Filled 2017-10-29: qty 1

## 2017-10-29 MED ORDER — HYDROCORTISONE 1 % EX CREA: TOPICAL_CREAM | CUTANEOUS | 0 refills | Status: DC

## 2017-10-29 MED ORDER — DIPHENHYDRAMINE HCL 25 MG PO TABS: 25.0000 mg | ORAL_TABLET | Freq: Four times a day (QID) | ORAL | 0 refills | Status: DC

## 2017-10-29 NOTE — ED Triage Notes (Signed)
Pt woke up this am with what she describes as "insect bites".  Pt appears to have hives.  Pt took penicillin (leftover from her son) and 2 tylenol pt.  No evidence of sob.  Pt denies any change in soap, lotion or detergent.

## 2017-10-29 NOTE — Discharge Instructions (Signed)
Take Benadryl as needed for itching and hives Use Hydrocortisone cream on itchy spots Please follow up with your doctor

## 2017-10-29 NOTE — ED Provider Notes (Signed)
[facility]   CSN: [id number] Arrival date & time: 10/29/17  1551     History   Chief Complaint Chief Complaint  Patient presents with  . Urticaria    HPI Lacey Whitaker is a 31 y.o. female who presents with a rash.  Past medical history significant for asthma, obesity.  Patient states that about 530 this morning she noticed that she was itching around her neck and back.  Lately she thinks she may have bit by something because she has had a similar reaction in the past when she was noted by a spider but cannot recall being bit by anything.  She states that the rash has worsened throughout the day and now she has some soreness so she decided to come to the emergency department for treatment.  She denies any new soaps, lotions, detergents.  She denies any food allergies.  She denies any new medicines.  She has not taken any medicine for the rash.  No one at home has a similar rash.  She denies fevers.  States she feels lightheaded and short of breath when walking up stairs.  HPI  Past Medical History:  Diagnosis Date  . Asthma   . Cyst, mediastinum    pt. reports having the cyst removed last year and has reoccured  . Iron deficiency anemia due to chronic blood loss   . Morbid obesity (HCC)    s/p gastric bypass surgery in 2009  . Pregnancy induced hypertension    2006    Patient Active Problem List   Diagnosis Date Noted  . Asthma, chronic   . Iron deficiency anemia due to chronic blood loss   . Obesities, morbid (HCC) 08/10/2011    Past Surgical History:  Procedure Laterality Date  . BONE CYST EXCISION  2010  . CESAREAN SECTION    . CESAREAN SECTION  09/02/2011   Procedure: CESAREAN SECTION;  Surgeon: Purcell NailsAngela Y Roberts, MD;  Location: [facility];  Service: Gynecology;  Laterality: N/A;  . GASTRIC BYPASS  05/2008  . GASTRIC BYPASS  2009  . WISDOM TOOTH EXTRACTION  09/2010    OB History    Gravida Para Term Preterm AB  Living   3 3 3     3    SAB TAB Ectopic Multiple Live Births           2       Home Medications    Prior to Admission medications   Medication Sig Start Date End Date Taking? Authorizing Provider  diphenhydrAMINE (BENADRYL) 25 MG tablet Take 1 tablet (25 mg total) by mouth every 6 (six) hours. 10/29/17   Bethel BornGekas, Ginnie Marich Marie, PA-C  ferrous sulfate 300 (60 Fe) MG/5ML syrup Take 5 mLs (300 mg total) by mouth daily. 03/16/17   Salley Scarleturham, Kawanta F, MD  hydrocortisone cream 1 % Apply to affected area 2 times daily 10/29/17   Bethel BornGekas, Makylee Sanborn Marie, PA-C  levonorgestrel [facility](MIRENA) 20 MCG/24HR IUD 1 each by Intrauterine route once.    [provider]  lisinopril-hydrochlorothiazide (PRINZIDE,ZESTORETIC) 20-12.5 MG tablet TAKE 2 TABLETS BY MOUTH EVERY DAY 10/16/17   Donita BrooksPickard, Warren T, MD  phentermine 37.5 MG capsule Take 1 capsule (37.5 mg total) by mouth every morning. 02/23/17   Donita BrooksPickard, Warren T, MD  polyethylene glycol powder (GLYCOLAX/MIRALAX) powder Take 17 g by mouth 2 (two) times daily as needed. 03/16/17   Donita BrooksPickard, Warren T, MD    Family History Family History  Problem Relation Age of  Onset  . Asthma Mother   . Asthma Brother     Social History Social History   Tobacco Use  . Smoking status: Current Some Day Smoker    Types: Cigars  . Smokeless tobacco: Never Used  . Tobacco comment: Smokes 2 Black & Milds daily  Substance Use Topics  . Alcohol use: Yes  . Drug use: No     Allergies   Patient has no known allergies.   Review of Systems Review of Systems  Constitutional: Negative for fever.  Respiratory: Positive for shortness of breath.   Skin: Positive for rash.  Neurological: Positive for light-headedness.     Physical Exam Updated Vital Signs BP (!) 148/88 (BP Location: Right Arm)   Pulse 93   Temp 98.6 F (37 C) (Oral)   Resp 16   Wt (!) 157.4 kg (347 lb)   SpO2 98%   BMI 54.35 kg/m   Physical Exam  Constitutional: She is oriented to person, place,  and time. She appears well-developed and well-nourished. No distress.  Obese  HENT:  Head: Normocephalic and atraumatic.  Eyes: Conjunctivae are normal. Pupils are equal, round, and reactive to light. Right eye exhibits no discharge. Left eye exhibits no discharge. No scleral icterus.  Neck: Normal range of motion.  Cardiovascular: Normal rate and regular rhythm. Exam reveals no gallop and no friction rub.  No murmur heard. Pulmonary/Chest: Effort normal and breath sounds normal. No stridor. No respiratory distress. She has no wheezes. She has no rales. She exhibits no tenderness.  Abdominal: She exhibits no distension.  Neurological: She is alert and oriented to person, place, and time.  Skin: Skin is warm and dry. Rash (Hives around anterior neck, left upper back, right flank) noted.  Psychiatric: She has a normal mood and affect. Her behavior is normal.  Nursing note and vitals reviewed.    ED Treatments / Results  Labs (all labs ordered are listed, but only abnormal results are displayed) Labs Reviewed - No data to display  EKG  EKG Interpretation None       Radiology No results found.  Procedures Procedures (including critical care time)  Medications Ordered in ED Medications  dexamethasone (DECADRON) injection 10 mg (not administered)     Initial Impression / Assessment and Plan / ED Course  I have reviewed the triage vital signs and the nursing notes.  Pertinent labs & imaging results that were available during my care of the patient were reviewed by me and considered in my medical decision making (see chart for details).  31 year old female with hives.  Vital signs are normal.  She complains of shortness of breath however her oxygen saturation is 98% and lungs are clear to auscultation.  Will treat with IM Decadron and give prescriptions for Benadryl and hydrocortisone.  Advise follow-up with PCP.  Return precautions given.  Final Clinical Impressions(s) / ED  Diagnoses   Final diagnoses:  Urticaria    ED Discharge Orders        Ordered    diphenhydrAMINE (BENADRYL) 25 MG tablet  Every 6 hours     10/29/17 1710    hydrocortisone cream 1 %     10/29/17 1710       Bethel BornGekas, Jamez Ambrocio Marie, PA-C 10/29/17 1724    Jacalyn LefevreHaviland, Julie, MD 10/29/17 1743

## 2017-12-03 ENCOUNTER — Ambulatory Visit: Payer: Self-pay | Admitting: Family Medicine

## 2017-12-07 ENCOUNTER — Ambulatory Visit: Payer: Self-pay | Admitting: Family Medicine

## 2017-12-11 ENCOUNTER — Encounter: Payer: Self-pay | Admitting: Family Medicine

## 2018-01-21 ENCOUNTER — Other Ambulatory Visit: Payer: Self-pay | Admitting: Family Medicine

## 2018-01-24 ENCOUNTER — Ambulatory Visit (HOSPITAL_COMMUNITY)
Admission: EM | Admit: 2018-01-24 | Discharge: 2018-01-24 | Disposition: A | Discharge status: Home / Self Care | Payer: Medicaid Other | Attending: Family Medicine | Admitting: Family Medicine

## 2018-01-24 ENCOUNTER — Other Ambulatory Visit: Payer: Self-pay

## 2018-01-24 ENCOUNTER — Encounter (HOSPITAL_COMMUNITY): Payer: Self-pay | Admitting: Emergency Medicine

## 2018-01-24 DIAGNOSIS — R05 Cough: Secondary | ICD-10-CM

## 2018-01-24 DIAGNOSIS — R69 Illness, unspecified: Secondary | ICD-10-CM

## 2018-01-24 DIAGNOSIS — R0981 Nasal congestion: Secondary | ICD-10-CM

## 2018-01-24 DIAGNOSIS — R062 Wheezing: Secondary | ICD-10-CM

## 2018-01-24 DIAGNOSIS — J111 Influenza due to unidentified influenza virus with other respiratory manifestations: Secondary | ICD-10-CM

## 2018-01-24 MED ORDER — HYDROCODONE-HOMATROPINE 5-1.5 MG/5ML PO SYRP: 5.0000 mL | ORAL_SOLUTION | Freq: Four times a day (QID) | ORAL | 0 refills | Status: DC | PRN

## 2018-01-24 MED ORDER — PREDNISONE 10 MG (21) PO TBPK: ORAL_TABLET | Freq: Every day | ORAL | 0 refills | Status: DC

## 2018-01-24 NOTE — Discharge Instructions (Signed)

## 2018-01-24 NOTE — ED Triage Notes (Signed)
Pt c/o flu like symptoms, fever, chills, temp of 102 at home, body aches since saturday

## 2018-01-28 NOTE — ED Provider Notes (Signed)
[facility]   [id number] 01/24/18 Arrival Time: 1919  ASSESSMENT & PLAN:  1. Influenza-like illness   2. Wheezing     Meds ordered this encounter  Medications  . HYDROcodone-homatropine (HYCODAN) 5-1.5 MG/5ML syrup    Sig: Take 5 mLs by mouth every 6 (six) hours as needed for cough.    Dispense:  90 mL    Refill:  0  . predniSONE (STERAPRED UNI-PAK 21 TAB) 10 MG (21) TBPK tablet    Sig: Take by mouth daily. Take as directed.    Dispense:  21 tablet    Refill:  0   Cough medication sedation precautions. Discussed typical duration of symptoms. OTC symptom care as needed. Ensure adequate fluid intake and rest. May f/u with PCP or here as needed.  Reviewed expectations re: course of current medical issues. Questions answered. Outlined signs and symptoms indicating need for more acute intervention. Patient verbalized understanding. After Visit Summary given.   SUBJECTIVE: History from: patient.  Lacey Whitaker is a 32 y.o. female who presents with complaint of nasal congestion, post-nasal drainage, and a persistent dry cough. Onset abrupt, approximately a few days ago. Overall fatigued with body aches. SOB: none. Wheezing: mild to moderate, esp with coughing. Fever: yes. Overall normal PO intake without n/v. Sick contacts: no. OTC treatment: Tylenol with mild relief; inhaler prn with temporary help.   Social History   Tobacco Use  Smoking Status Current Some Day Smoker  . Types: Cigars  Smokeless Tobacco Never Used  Tobacco Comment   Smokes 2 Black & Milds daily    ROS: As per HPI.   OBJECTIVE:  Vitals:   01/24/18 1931  BP: 139/68  Pulse: 94  Resp: 16  Temp: 98.9 F (37.2 C)  TempSrc: Oral  SpO2: 100%     General appearance: alert; appears fatigued HEENT: nasal congestion; clear runny nose; throat irritation secondary to post-nasal drainage Neck: supple without LAD Lungs: unlabored respirations, symmetrical air entry with mild exp wheezes;  cough: moderate; no respiratory distress Skin: warm and dry Psychological: alert and cooperative; normal mood and affect   No Known Allergies  Past Medical History:  Diagnosis Date  . Asthma   . Cyst, mediastinum    pt. reports having the cyst removed last year and has reoccured  . Iron deficiency anemia due to chronic blood loss   . Morbid obesity (HCC)    s/p gastric bypass surgery in 2009  . Pregnancy induced hypertension    2006   Family History  Problem Relation Age of Onset  . Asthma Mother   . Asthma Brother    Social History   Socioeconomic History  . Marital status: Single    Spouse name: Not on file  . Number of children: Not on file  . Years of education: Not on file  . Highest education level: Not on file  Social Needs  . Financial resource strain: Not on file  . Food insecurity - worry: Not on file  . Food insecurity - inability: Not on file  . Transportation needs - medical: Not on file  . Transportation needs - non-medical: Not on file  Occupational History  . Not on file  Tobacco Use  . Smoking status: Current Some Day Smoker    Types: Cigars  . Smokeless tobacco: Never Used  . Tobacco comment: Smokes 2 Black & Milds daily  Substance and Sexual Activity  . Alcohol use: Yes  . Drug use: No  . Sexual activity: Yes  Birth control/protection: IUD  Other Topics Concern  . Not on file  Social History Narrative   Works at [employer]   [place]   3 children: Ages 248,6,2 y/o    Lives with her Mom, her brother, and her 3 children.               Mardella LaymanHagler, Charlina Dwight, MD 01/28/18 [phone]

## 2018-04-26 ENCOUNTER — Other Ambulatory Visit: Payer: Self-pay | Admitting: Family Medicine

## 2018-05-06 ENCOUNTER — Encounter (HOSPITAL_COMMUNITY): Payer: Self-pay | Admitting: Family Medicine

## 2018-05-06 ENCOUNTER — Ambulatory Visit (HOSPITAL_COMMUNITY)
Admission: EM | Admit: 2018-05-06 | Discharge: 2018-05-06 | Disposition: A | Discharge status: Home / Self Care | Payer: Medicaid Other | Attending: Internal Medicine | Admitting: Internal Medicine

## 2018-05-06 ENCOUNTER — Telehealth (HOSPITAL_COMMUNITY): Payer: Self-pay | Admitting: Emergency Medicine

## 2018-05-06 DIAGNOSIS — R600 Localized edema: Secondary | ICD-10-CM

## 2018-05-06 DIAGNOSIS — I10 Essential (primary) hypertension: Secondary | ICD-10-CM

## 2018-05-06 DIAGNOSIS — R2243 Localized swelling, mass and lump, lower limb, bilateral: Secondary | ICD-10-CM

## 2018-05-06 LAB — POCT I-STAT, CHEM 8
BUN: 9 mg/dL (ref 6–20)
CREATININE: 0.9 mg/dL (ref 0.44–1.00)
Calcium, Ion: 1.11 mmol/L — ABNORMAL LOW (ref 1.15–1.40)
Chloride: 104 mmol/L (ref 101–111)
GLUCOSE: 77 mg/dL (ref 65–99)
HCT: 32 % — ABNORMAL LOW (ref 36.0–46.0)
Hemoglobin: 10.9 g/dL — ABNORMAL LOW (ref 12.0–15.0)
Potassium: 4.2 mmol/L (ref 3.5–5.1)
Sodium: 140 mmol/L (ref 135–145)
TCO2: 23 mmol/L (ref 22–32)

## 2018-05-06 MED ORDER — LISINOPRIL-HYDROCHLOROTHIAZIDE 20-12.5 MG PO TABS: 2.0000 | ORAL_TABLET | Freq: Every day | ORAL | 1 refills | Status: DC

## 2018-05-06 MED ORDER — FUROSEMIDE 20 MG PO TABS: 20.0000 mg | ORAL_TABLET | Freq: Every day | ORAL | 0 refills | Status: DC

## 2018-05-06 NOTE — ED Provider Notes (Signed)
[facility]    CSN: [id number] Arrival date & time: 05/06/18  1306     History   Chief Complaint Chief Complaint  Patient presents with  . Leg Swelling    HPI Lacey Whitaker is a 32 y.o. female.   32 year old female with history of hypertension comes in for 5-day history of bilateral lower leg swelling.  States she has had similar symptoms in her left leg in the past, but never both at the same time.  States swelling started off around the ankles, and now has traveled up to her knees.  Denies erythema, increased warmth, pain.  Does state that she feels some tightness, and intermittent numbness and tingling.  She does have mild improvement first thing in the morning, but swelling is not completely resolved.  Denies any increase in activity, standing/walking.  States she sits long hours for work, but no obvious changes.  Denies long travel/immobility, recent surgery.  Has a Mirena IUD.  Denies chest pain, shortness of breath, weakness, dizziness.  Denies palpitation, orthopnea.  She does feel that she has some dyspnea on exertion with walking distances she is usually able to do.  Denies personal or family history of blood clots.  Denies personal or family history of heart disease.  States she has been on lisinopril-HCTZ for her blood pressure without changes in dosage, she ran out 2 days ago, and swelling is slightly worse with it.      Past Medical History:  Diagnosis Date  . Asthma   . Cyst, mediastinum    pt. reports having the cyst removed last year and has reoccured  . Iron deficiency anemia due to chronic blood loss   . Morbid obesity (HCC)    s/p gastric bypass surgery in 2009  . Pregnancy induced hypertension    2006    Patient Active Problem List   Diagnosis Date Noted  . Asthma, chronic   . Iron deficiency anemia due to chronic blood loss   . Obesities, morbid (HCC) 08/10/2011    Past Surgical History:  Procedure Laterality Date  . BONE CYST  EXCISION  2010  . CESAREAN SECTION    . CESAREAN SECTION  09/02/2011   Procedure: CESAREAN SECTION;  Surgeon: Purcell Nails, MD;  Location: [facility];  Service: Gynecology;  Laterality: N/A;  . GASTRIC BYPASS  05/2008  . GASTRIC BYPASS  2009  . WISDOM TOOTH EXTRACTION  09/2010    OB History    Gravida  3   Para  3   Term  3   Preterm      AB      Living  3     SAB      TAB      Ectopic      Multiple      Live Births  2            Home Medications    Prior to Admission medications   Medication Sig Start Date End Date Taking? Authorizing Provider  albuterol (PROVENTIL HFA) 108 (90 Base) MCG/ACT inhaler Inhale 2 puffs into the lungs every 6 (six) hours as needed for wheezing or shortness of breath.    [provider]  ferrous sulfate 300 (60 Fe) MG/5ML syrup Take 5 mLs (300 mg total) by mouth daily. Patient not taking: Reported on 10/29/2017 03/16/17   Salley Scarlet, MD  furosemide (LASIX) 20 MG tablet Take 1 tablet (20 mg total) by mouth daily. 05/06/18   Linward Headland  V, PA-C  hydrocortisone cream 1 % Apply to affected area 2 times daily 10/29/17   Bethel BornGekas, Kelly Marie, PA-C  levonorgestrel [facility](MIRENA) 20 MCG/24HR IUD 1 each by Intrauterine route once.    [provider]  lisinopril-hydrochlorothiazide (PRINZIDE,ZESTORETIC) 20-12.5 MG tablet Take 2 tablets by mouth daily. Requires office visit before any further refills can be given. 05/06/18 06/05/18  Belinda FisherYu, Zoya Sprecher V, PA-C  phentermine 37.5 MG capsule Take 1 capsule (37.5 mg total) by mouth every morning. Patient not taking: Reported on 10/29/2017 02/23/17   Donita BrooksPickard, Warren T, MD  polyethylene glycol powder (GLYCOLAX/MIRALAX) powder Take 17 g by mouth 2 (two) times daily as needed. Patient not taking: Reported on 10/29/2017 03/16/17   Donita BrooksPickard, Warren T, MD    Family History Family History  Problem Relation Age of Onset  . Asthma Mother   . Asthma Brother     Social History Social History   Tobacco Use    . Smoking status: Current Some Day Smoker    Types: Cigars  . Smokeless tobacco: Never Used  . Tobacco comment: Smokes 2 Black & Milds daily  Substance Use Topics  . Alcohol use: Yes  . Drug use: No     Allergies   Patient has no known allergies.   Review of Systems Review of Systems  Reason unable to perform ROS: See HPI as above.     Physical Exam Triage Vital Signs ED Triage Vitals [05/06/18 1327]  Enc Vitals Group     BP (!) 138/57     Pulse Rate 75     Resp 18     Temp 98.6 F (37 C)     Temp src      SpO2 100 %     Weight      Height      Head Circumference      Peak Flow      Pain Score 4     Pain Loc      Pain Edu?      Excl. in GC?    No data found.  Updated Vital Signs BP (!) 138/57   Pulse 75   Temp 98.6 F (37 C)   Resp 18   SpO2 100%   Physical Exam  Constitutional: She is oriented to person, place, and time. She appears well-developed and well-nourished. No distress.  HENT:  Head: Normocephalic and atraumatic.  Eyes: Pupils are equal, round, and reactive to light. Conjunctivae are normal.  Cardiovascular: Normal rate, regular rhythm and normal heart sounds. Exam reveals no gallop and no friction rub.  No murmur heard. Pulmonary/Chest: Effort normal and breath sounds normal. No accessory muscle usage or stridor. No respiratory distress. She has no decreased breath sounds. She has no wheezes. She has no rhonchi. She has no rales.  Musculoskeletal:  Anatomy hard to identify due to body habitus.  Pitting edema to mid shin.  No erythema, increased warmth.  No tenderness to palpation.  Full range of motion of knee and ankle.  Strength normal and equal bilaterally.  Sensation  intact and equal bilaterally.  Pedal pulse 2+ and equal bilaterally.  Negative Homans.  Lower leg measurements: Right- 54cm (proximal 1/3), 37.7cm (mid shin), 28.5cm (distal 1/3) Left- 53.5cm (proximal 1/3), 37.5cm (mid shin), 28.7cm (distal 1/3)   Neurological: She is  alert and oriented to person, place, and time.  Skin: Skin is warm and dry.     UC Treatments / Results  Labs (all labs ordered are listed, but only  abnormal results are displayed) Labs Reviewed  POCT I-STAT, CHEM 8 - Abnormal; Notable for the following components:      Result Value   Calcium, Ion 1.11 (*)    Hemoglobin 10.9 (*)    HCT 32.0 (*)    All other components within normal limits    EKG None  Radiology No results found.  Procedures Procedures (including critical care time)  Medications Ordered in UC Medications - No data to display  Initial Impression / Assessment and Plan / UC Course  I have reviewed the triage vital signs and the nursing notes.  Pertinent labs & imaging results that were available during my care of the patient were reviewed by me and considered in my medical decision making (see chart for details).    Electrolytes and creatinine within normal limits.  No alarming signs on exam.  Bilateral leg edema without erythema, increased warmth.  Well score of 0.  Patient with a history of heart disease, currently denies chest pain, shortness of breath, palpitation, orthopnea.  Lung exam clear to auscultation bilaterally without adventitious lung sounds, low suspicion for CHF. Will have patient restart lisinopril-HCTZ.  Short course of low-dose Lasix to help with bilateral leg edema.  Patient to elevate foot, wear compression stockings.  Return precautions given.  Patient expresses understanding and agrees to plan.  Final Clinical Impressions(s) / UC Diagnoses   Final diagnoses:  Bilateral lower extremity edema    ED Prescriptions    Medication Sig Dispense Auth. Provider   lisinopril-hydrochlorothiazide (PRINZIDE,ZESTORETIC) 20-12.5 MG tablet Take 2 tablets by mouth daily. Requires office visit before any further refills can be given. 60 tablet Kenzley Ke V, PA-C   furosemide (LASIX) 20 MG tablet Take 1 tablet (20 mg total) by mouth daily. 3 tablet Threasa Alpha, PA-C 05/06/18 1458

## 2018-05-06 NOTE — Discharge Instructions (Signed)
Your electrolytes and kidney function are both normal.  Restart your blood pressure medicine.  I have given you a 60-day supply, please follow-up with your primary care for further refills needed.  Lasix as directed for 3 days.  Please keep your leg elevated, above your heart.  Compression stockings, that she can find in any pharmacy/medical supply store.  Follow-up with PCP for further evaluation if symptoms not improving.  If experiencing worsening symptoms, worsening swelling in 1 day, redness, pain, increased warmth, chest pain, shortness of breath, go to the emergency department for further evaluation needed.

## 2018-05-06 NOTE — ED Triage Notes (Signed)
Pt with hx of HTN here for BLE edema that started on Thursday. She reports that this is new for her. She will at times get swelling in the ankle but never this severe affecting both legs. She takes Lisinopril for HTN. Sts some SOB at times and more with exertion. No hx of heart issues. sts some pain and tightness in the legs with numbness.

## 2018-07-12 ENCOUNTER — Ambulatory Visit (HOSPITAL_COMMUNITY)
Admission: EM | Admit: 2018-07-12 | Discharge: 2018-07-12 | Disposition: A | Discharge status: Home / Self Care | Payer: Medicaid Other | Attending: Internal Medicine | Admitting: Internal Medicine

## 2018-07-12 ENCOUNTER — Encounter (HOSPITAL_COMMUNITY): Payer: Self-pay | Admitting: Emergency Medicine

## 2018-07-12 DIAGNOSIS — R609 Edema, unspecified: Secondary | ICD-10-CM

## 2018-07-12 DIAGNOSIS — Z760 Encounter for issue of repeat prescription: Secondary | ICD-10-CM

## 2018-07-12 DIAGNOSIS — R600 Localized edema: Secondary | ICD-10-CM

## 2018-07-12 DIAGNOSIS — I10 Essential (primary) hypertension: Secondary | ICD-10-CM

## 2018-07-12 DIAGNOSIS — R2243 Localized swelling, mass and lump, lower limb, bilateral: Secondary | ICD-10-CM

## 2018-07-12 MED ORDER — FUROSEMIDE 20 MG PO TABS: 20.0000 mg | ORAL_TABLET | Freq: Every day | ORAL | 0 refills | Status: DC

## 2018-07-12 MED ORDER — LISINOPRIL-HYDROCHLOROTHIAZIDE 20-12.5 MG PO TABS: 2.0000 | ORAL_TABLET | Freq: Every day | ORAL | 0 refills | Status: DC

## 2018-07-12 NOTE — Discharge Instructions (Addendum)
Recommend restart Lisinopril/HCTZ 1 to 2 tablets daily depending upon response (decrease in swelling). May also add Lasix 20mg  tablet in AM for next 2 to 3 days to help with swelling. Continue to monitor blood pressure. Follow-up with your PCP next week as planned for further evaluation and additional refills.

## 2018-07-12 NOTE — ED Triage Notes (Signed)
Pt states she was seen here two months ago for leg swelling and was seen by Linward HeadlandAmy Yu, pt had not been taking her lisinopril-hztz combo drug, pt given refill, leg swelling went down. Pt ran out of the medicine 1 week ago and legs are swollen again. Is unable to see her new PCP until 8/30.

## 2018-07-13 NOTE — ED Provider Notes (Signed)
[facility]    CSN: [id number] Arrival date & time: 07/12/18  1505     History   Chief Complaint Chief Complaint  Patient presents with  . Leg Swelling  . Medication Refill    HPI Lacey Whitaker is a 32 y.o. female.   32 year old female presents with bilateral peripheral lower leg and ankle swelling for the past 5 to 6 days. She was on Lisinopril/HCTZ for HTN and peripheral edema with success but ran out about 1 week ago. She has an appointment to see her PCP on 07/19/18 but requests a refill until she can see him. She was seen here 2 months ago for this issue and was also given Lasix 20mg  for a few days with great success. She had told her mom that her "ankles were the smallest they had been in years". She denies any chest pain, difficulty breathing, or leg pain. Her left ankle is slightly more swollen than her right and she has tightness but no redness, pain or numbness in her legs. She has also tried decreasing salt in her diet with minimal success in decreasing edema. No other chronic health issues except mild asthma and has an Albuterol inhaler. She did have gastric bypass surgery 10 years ago but no longer takes supplements.   The history is provided by the patient.    Past Medical History:  Diagnosis Date  . Asthma   . Cyst, mediastinum    pt. reports having the cyst removed last year and has reoccured  . Iron deficiency anemia due to chronic blood loss   . Morbid obesity (HCC)    s/p gastric bypass surgery in 2009  . Pregnancy induced hypertension    2006    Patient Active Problem List   Diagnosis Date Noted  . Asthma, chronic   . Iron deficiency anemia due to chronic blood loss   . Obesities, morbid (HCC) 08/10/2011    Past Surgical History:  Procedure Laterality Date  . BONE CYST EXCISION  2010  . CESAREAN SECTION    . CESAREAN SECTION  09/02/2011   Procedure: CESAREAN SECTION;  Surgeon: Purcell Nails, MD;  Location: [facility];  Service:  Gynecology;  Laterality: N/A;  . GASTRIC BYPASS  05/2008  . GASTRIC BYPASS  2009  . WISDOM TOOTH EXTRACTION  09/2010    OB History    Gravida  3   Para  3   Term  3   Preterm      AB      Living  3     SAB      TAB      Ectopic      Multiple      Live Births  2            Home Medications    Prior to Admission medications   Medication Sig Start Date End Date Taking? Authorizing Provider  albuterol (PROVENTIL HFA) 108 (90 Base) MCG/ACT inhaler Inhale 2 puffs into the lungs every 6 (six) hours as needed for wheezing or shortness of breath.    [provider]  furosemide (LASIX) 20 MG tablet Take 1 tablet (20 mg total) by mouth daily for 5 days. 07/12/18 07/17/18  Sudie Grumbling, NP  hydrocortisone cream 1 % Apply to affected area 2 times daily 10/29/17   Bethel Born, PA-C  levonorgestrel [facility]) 20 MCG/24HR IUD 1 each by Intrauterine route once.    [provider]  lisinopril-hydrochlorothiazide (ZESTORETIC) 20-12.5  MG tablet Take 2 tablets by mouth daily. 07/12/18 08/11/18  Sudie Grumbling, NP    Family History Family History  Problem Relation Age of Onset  . Asthma Mother   . Asthma Brother     Social History Social History   Tobacco Use  . Smoking status: Current Some Day Smoker    Types: Cigars  . Smokeless tobacco: Never Used  . Tobacco comment: Smokes 2 Black & Milds daily  Substance Use Topics  . Alcohol use: Yes  . Drug use: No     Allergies   Patient has no known allergies.   Review of Systems Review of Systems  Constitutional: Negative for activity change, appetite change, chills, fatigue and fever.  Respiratory: Negative for cough, chest tightness, shortness of breath, wheezing and stridor.   Cardiovascular: Positive for leg swelling. Negative for chest pain and palpitations.  Gastrointestinal: Negative for abdominal pain, nausea and vomiting.  Genitourinary: Negative for decreased urine volume, difficulty  urinating, flank pain and hematuria.  Musculoskeletal: Negative for arthralgias, back pain, joint swelling and myalgias.  Skin: Negative for color change, pallor, rash and wound.  Allergic/Immunologic: Negative for immunocompromised state.  Neurological: Negative for dizziness, tremors, seizures, syncope, weakness, light-headedness, numbness and headaches.  Hematological: Negative for adenopathy. Does not bruise/bleed easily.  Psychiatric/Behavioral: Negative.      Physical Exam Triage Vital Signs ED Triage Vitals  Enc Vitals Group     BP 07/12/18 1531 103/69     Pulse Rate 07/12/18 1531 96     Resp 07/12/18 1531 18     Temp 07/12/18 1531 98.4 F (36.9 C)     Temp src --      SpO2 07/12/18 1531 99 %     Weight --      Height --      Head Circumference --      Peak Flow --      Pain Score 07/12/18 1532 0     Pain Loc --      Pain Edu? --      Excl. in GC? --    No data found.  Updated Vital Signs BP 103/69   Pulse 96   Temp 98.4 F (36.9 C)   Resp 18   SpO2 99%   Visual Acuity Right Eye Distance:   Left Eye Distance:   Bilateral Distance:    Right Eye Near:   Left Eye Near:    Bilateral Near:     Physical Exam  Constitutional: She is oriented to person, place, and time. She appears well-developed and well-nourished. No distress.  HENT:  Head: Normocephalic and atraumatic.  Mouth/Throat: Oropharynx is clear and moist.  Eyes: Conjunctivae and EOM are normal.  Neck: Normal range of motion.  Cardiovascular: Normal rate, regular rhythm and normal heart sounds.  No murmur heard. Pulmonary/Chest: Effort normal and breath sounds normal. No respiratory distress. She has no decreased breath sounds. She has no wheezes. She has no rhonchi.  Musculoskeletal: Normal range of motion. She exhibits edema. She exhibits no tenderness.       Right lower leg: She exhibits swelling and edema. She exhibits no tenderness, no deformity and no laceration.       Left lower leg: She  exhibits swelling and edema. She exhibits no tenderness, no deformity and no laceration.       Legs: 1 to 2+ Non-pitting edema present in lower legs bilaterally, ankles and dorsal aspect of both feet. Left ankle and foot slightly more swollen.  No redness or pain. Good pulses and capillary refill. No neuro deficits present.   Neurological: She is alert and oriented to person, place, and time. She has normal strength and normal reflexes. No sensory deficit.  Skin: Skin is warm, dry and intact. Capillary refill takes less than 2 seconds. No bruising and no rash noted. No erythema.  Psychiatric: She has a normal mood and affect. Her behavior is normal. Judgment and thought content normal.     UC Treatments / Results  Labs (all labs ordered are listed, but only abnormal results are displayed) Labs Reviewed - No data to display  EKG None  Radiology No results found.  Procedures Procedures (including critical care time)  Medications Ordered in UC Medications - No data to display  Initial Impression / Assessment and Plan / UC Course  I have reviewed the triage vital signs and the nursing notes.  Pertinent labs & imaging results that were available during my care of the patient were reviewed by me and considered in my medical decision making (see chart for details).    Recommend restart Lisinopril/HCTZ 20-12.5- to take 1 to 2 tablets daily depending upon response in swelling and blood pressure readings and hypotension symptoms. Blood pressure is on the lower end of normal today so continue to monitor. May add Lasix 20mg  daily for the next 2 to 3 days to help with edema. Try to elevated legs as much as possible. Recommend follow-up with her PCP next week as planned.  Final Clinical Impressions(s) / UC Diagnoses   Final diagnoses:  Peripheral edema     Discharge Instructions     Recommend restart Lisinopril/HCTZ 1 to 2 tablets daily depending upon response (decrease in swelling). May  also add Lasix 20mg  tablet in AM for next 2 to 3 days to help with swelling. Continue to monitor blood pressure. Follow-up with your PCP next week as planned for further evaluation and additional refills.     ED Prescriptions    Medication Sig Dispense Auth. Provider   furosemide (LASIX) 20 MG tablet Take 1 tablet (20 mg total) by mouth daily for 5 days. 5 tablet Sudie GrumblingAmyot, Olena Willy Berry, NP   lisinopril-hydrochlorothiazide (ZESTORETIC) 20-12.5 MG tablet Take 2 tablets by mouth daily. 60 tablet Sudie GrumblingAmyot, Hermenia Fritcher Berry, NP     Controlled Substance Prescriptions [place] Controlled Substance Registry consulted? Not Applicable   Sudie Grumblingmyot, Sigismund Cross Berry, NP 07/13/18 2356

## 2018-08-14 ENCOUNTER — Encounter (HOSPITAL_COMMUNITY): Payer: Self-pay | Admitting: Emergency Medicine

## 2018-08-14 ENCOUNTER — Ambulatory Visit (HOSPITAL_COMMUNITY)
Admission: EM | Admit: 2018-08-14 | Discharge: 2018-08-14 | Disposition: A | Discharge status: Home / Self Care | Payer: Medicaid Other | Attending: Family Medicine | Admitting: Family Medicine

## 2018-08-14 DIAGNOSIS — R509 Fever, unspecified: Secondary | ICD-10-CM

## 2018-08-14 DIAGNOSIS — J029 Acute pharyngitis, unspecified: Secondary | ICD-10-CM

## 2018-08-14 LAB — POCT RAPID STREP A: Streptococcus, Group A Screen (Direct): POSITIVE — AB

## 2018-08-14 MED ORDER — AMOXICILLIN 500 MG PO CAPS: 500.0000 mg | ORAL_CAPSULE | Freq: Three times a day (TID) | ORAL | 0 refills | Status: AC

## 2018-08-14 NOTE — ED Provider Notes (Signed)
[facility]    CSN: [id number] Arrival date & time: 08/14/18  5621     History   Chief Complaint Chief Complaint  Patient presents with  . Sore Throat    HPI Lacey Whitaker is a 32 y.o. female history of asthma presenting today for evaluation of a sore throat.  Patient states that yesterday she woke up with sore throat and painful swallowing.  She also has noted a fever of up to 102.  Pain is also in her right ear.  She has had associated body aches.  Denies associated congestion or cough.  Notes that her mother was recently treated for strep.  She has tried TheraFlu, hot tea and Benadryl without relief.  Poor oral intake due to pain with swallowing.  Denies nausea or vomiting or abdominal pain.  Slight discomfort with moving neck, but denies difficulty moving neck.  HPI  Past Medical History:  Diagnosis Date  . Asthma   . Cyst, mediastinum    pt. reports having the cyst removed last year and has reoccured  . Iron deficiency anemia due to chronic blood loss   . Morbid obesity (HCC)    s/p gastric bypass surgery in 2009  . Pregnancy induced hypertension    2006    Patient Active Problem List   Diagnosis Date Noted  . Asthma, chronic   . Iron deficiency anemia due to chronic blood loss   . Obesities, morbid (HCC) 08/10/2011    Past Surgical History:  Procedure Laterality Date  . BONE CYST EXCISION  2010  . CESAREAN SECTION    . CESAREAN SECTION  09/02/2011   Procedure: CESAREAN SECTION;  Surgeon: Purcell Nails, MD;  Location: [facility];  Service: Gynecology;  Laterality: N/A;  . GASTRIC BYPASS  05/2008  . GASTRIC BYPASS  2009  . WISDOM TOOTH EXTRACTION  09/2010    OB History    Gravida  3   Para  3   Term  3   Preterm      AB      Living  3     SAB      TAB      Ectopic      Multiple      Live Births  2            Home Medications    Prior to Admission medications   Medication Sig Start Date End Date Taking? Authorizing  Provider  levonorgestrel (MIRENA) 20 MCG/24HR IUD 1 each by Intrauterine route once.   Yes [provider]  lisinopril-hydrochlorothiazide (ZESTORETIC) 20-12.5 MG tablet Take 2 tablets by mouth daily. 07/12/18 08/14/18 Yes Amyot, Ali Lowe, NP  albuterol (PROVENTIL HFA) 108 (90 Base) MCG/ACT inhaler Inhale 2 puffs into the lungs every 6 (six) hours as needed for wheezing or shortness of breath.    [provider]  amoxicillin (AMOXIL) 500 MG capsule Take 1 capsule (500 mg total) by mouth 3 (three) times daily for 10 days. 08/14/18 08/24/18  Wieters, Hallie C, PA-C  furosemide (LASIX) 20 MG tablet Take 1 tablet (20 mg total) by mouth daily for 5 days. 07/12/18 07/17/18  Sudie Grumbling, NP  hydrocortisone cream 1 % Apply to affected area 2 times daily 10/29/17   Bethel Born, PA-C    Family History Family History  Problem Relation Age of Onset  . Asthma Mother   . Asthma Brother     Social History Social History   Tobacco Use  . Smoking  status: Current Some Day Smoker    Types: Cigars  . Smokeless tobacco: Never Used  . Tobacco comment: Smokes 2 Black & Milds daily  Substance Use Topics  . Alcohol use: Yes  . Drug use: No     Allergies   Patient has no known allergies.   Review of Systems Review of Systems  Constitutional: Positive for fever. Negative for activity change, appetite change, chills and fatigue.  HENT: Positive for sore throat. Negative for congestion, ear pain, rhinorrhea, sinus pressure and trouble swallowing.   Eyes: Negative for discharge and redness.  Respiratory: Negative for cough, chest tightness and shortness of breath.   Cardiovascular: Negative for chest pain.  Gastrointestinal: Negative for abdominal pain, diarrhea, nausea and vomiting.  Musculoskeletal: Positive for myalgias.  Skin: Negative for rash.  Neurological: Negative for dizziness, light-headedness and headaches.     Physical Exam Triage Vital Signs ED Triage Vitals   Enc Vitals Group     BP 08/14/18 0810 133/66     Pulse Rate 08/14/18 0810 (!) 101     Resp 08/14/18 0810 20     Temp 08/14/18 0810 99.2 F (37.3 C)     Temp Source 08/14/18 0810 Oral     SpO2 08/14/18 0810 98 %     Weight --      Height --      Head Circumference --      Peak Flow --      Pain Score 08/14/18 0812 10     Pain Loc --      Pain Edu? --      Excl. in GC? --    No data found.  Updated Vital Signs BP 133/66 (BP Location: Left Arm)   Pulse (!) 101   Temp 99.2 F (37.3 C) (Oral)   Resp 20   SpO2 98%   Visual Acuity Right Eye Distance:   Left Eye Distance:   Bilateral Distance:    Right Eye Near:   Left Eye Near:    Bilateral Near:     Physical Exam  Constitutional: She is oriented to person, place, and time. She appears well-developed and well-nourished. No distress.  No acute distress  HENT:  Head: Normocephalic and atraumatic.  Nose: Nose normal.  Bilateral ears without tenderness to palpation of external auricle, tragus and mastoid, EAC's without erythema or swelling, TM's with good bony landmarks and cone of light. Non erythematous.  Oral mucosa pink and moist, moderate tonsillar enlargement with erythema, no exudate. Posterior pharynx patent and erythematous, no uvula deviation or swelling. Normal phonation.  Eyes: Conjunctivae are normal.  Neck: Neck supple.  Full active range of motion of neck, no neck swelling or erythema, no lymphadenopathy  Cardiovascular: Normal rate and regular rhythm.  No murmur heard. Pulmonary/Chest: Effort normal and breath sounds normal. No respiratory distress.  Breathing comfortably at rest, CTABL, no wheezing, rales or other adventitious sounds auscultated  Abdominal: Soft. She exhibits no distension. There is no tenderness.  Musculoskeletal: Normal range of motion. She exhibits no edema.  Neurological: She is alert and oriented to person, place, and time.  Skin: Skin is warm and dry.  Psychiatric: She has a  normal mood and affect.  Nursing note and vitals reviewed.    UC Treatments / Results  Labs (all labs ordered are listed, but only abnormal results are displayed) Labs Reviewed  POCT RAPID STREP A - Abnormal; Notable for the following components:      Result Value   Streptococcus,  Group A Screen (Direct) POSITIVE (*)    All other components within normal limits    EKG None  Radiology No results found.  Procedures Procedures (including critical care time)  Medications Ordered in UC Medications - No data to display  Initial Impression / Assessment and Plan / UC Course  I have reviewed the triage vital signs and the nursing notes.  Pertinent labs & imaging results that were available during my care of the patient were reviewed by me and considered in my medical decision making (see chart for details).     Patient tested positive for strep. No evidence of peritonsillar abscess or retropharyngeal abscess. Patient is nontoxic appearing, no drooling, dysphagia, muffled voice, or tripoding. No trismus.  Will treat with amoxicillin.  Discussed further symptomatic management of discomfort below.Discussed strict return precautions. Patient verbalized understanding and is agreeable with plan.  Discussed with patient that I could write her out for 2 days to give the antibiotic in her system for 24 hours and she may return on Friday, stated that she needed a note to say that she can return on Monday if she is off Friday to Sunday.  Final Clinical Impressions(s) / UC Diagnoses   Final diagnoses:  Sore throat     Discharge Instructions     Sore Throat  Your rapid strep test was positive today. We will treat you for strep throat with an antibiotic. Please take Amoxicillin as prescribed.   Please continue Tylenol or Ibuprofen for fever and pain. May try salt water gargles, cepacol lozenges, throat spray, or OTC cold relief medicine for throat discomfort. If you also have congestion  take a daily anti-histamine like Zyrtec, Claritin, and a oral decongestant to help with post nasal drip that may be irritating your throat.   Stay hydrated and drink plenty of fluids to keep your throat coated relieve irritation.    ED Prescriptions    Medication Sig Dispense Auth. Provider   amoxicillin (AMOXIL) 500 MG capsule Take 1 capsule (500 mg total) by mouth 3 (three) times daily for 10 days. 30 capsule Wieters, Hallie C, PA-C     Controlled Substance Prescriptions [place] Controlled Substance Registry consulted? Not Applicable   Lew Dawes, [place] 08/14/18 [phone]

## 2018-08-14 NOTE — ED Triage Notes (Signed)
PT reports fever, sore throat, body aches, right ear pain. Her mother just got over strep throat.

## 2018-08-14 NOTE — Discharge Instructions (Signed)

## 2019-01-09 ENCOUNTER — Ambulatory Visit (HOSPITAL_COMMUNITY)
Admission: EM | Admit: 2019-01-09 | Discharge: 2019-01-09 | Disposition: A | Discharge status: Home / Self Care | Payer: No Typology Code available for payment source | Attending: Internal Medicine | Admitting: Internal Medicine

## 2019-01-09 ENCOUNTER — Encounter (HOSPITAL_COMMUNITY): Payer: Self-pay | Admitting: Emergency Medicine

## 2019-01-09 ENCOUNTER — Other Ambulatory Visit: Payer: Self-pay

## 2019-01-09 DIAGNOSIS — R600 Localized edema: Secondary | ICD-10-CM | POA: Diagnosis not present

## 2019-01-09 DIAGNOSIS — W57XXXA Bitten or stung by nonvenomous insect and other nonvenomous arthropods, initial encounter: Secondary | ICD-10-CM

## 2019-01-09 MED ORDER — MUPIROCIN CALCIUM 2 % EX CREA: 1.0000 "application " | TOPICAL_CREAM | Freq: Two times a day (BID) | CUTANEOUS | 0 refills | Status: DC

## 2019-01-09 MED ORDER — FUROSEMIDE 20 MG PO TABS: 20.0000 mg | ORAL_TABLET | Freq: Every day | ORAL | 0 refills | Status: DC

## 2019-01-09 NOTE — ED Triage Notes (Signed)
PT reports bilateral lower extremity swelling for 3-4 days.   Small Abscess on left neck/ shoulder for 2 days.

## 2019-01-09 NOTE — ED Provider Notes (Signed)
[facility]    CSN: [id number] Arrival date & time: 01/09/19  1406     History   Chief Complaint Chief Complaint  Patient presents with  . Leg Swelling  . Abscess    HPI Lacey Whitaker is a 33 y.o. female who presents to the UC with c/o bilateral extremity swelling for the past 3 days. Patient reports that they have started to hurt a little. The swelling starts at the toes and goes to the knees. Patient denies shortness of breath, chest pain or abdominal pain.   Also c/o small infected area to the left side of neck x 2 days.  The history is provided by the patient. No language interpreter was used.    Past Medical History:  Diagnosis Date  . Asthma   . Cyst, mediastinum    pt. reports having the cyst removed last year and has reoccured  . Iron deficiency anemia due to chronic blood loss   . Morbid obesity (HCC)    s/p gastric bypass surgery in 2009  . Pregnancy induced hypertension    2006    Patient Active Problem List   Diagnosis Date Noted  . Asthma, chronic   . Iron deficiency anemia due to chronic blood loss   . Obesities, morbid (HCC) 08/10/2011    Past Surgical History:  Procedure Laterality Date  . BONE CYST EXCISION  2010  . CESAREAN SECTION    . CESAREAN SECTION  09/02/2011   Procedure: CESAREAN SECTION;  Surgeon: Purcell Nails, MD;  Location: [facility];  Service: Gynecology;  Laterality: N/A;  . GASTRIC BYPASS  05/2008  . GASTRIC BYPASS  2009  . WISDOM TOOTH EXTRACTION  09/2010    OB History    Gravida  3   Para  3   Term  3   Preterm      AB      Living  3     SAB      TAB      Ectopic      Multiple      Live Births  2            Home Medications    Prior to Admission medications   Medication Sig Start Date End Date Taking? Authorizing Provider  levonorgestrel (MIRENA) 20 MCG/24HR IUD 1 each by Intrauterine route once.   Yes [provider]  lisinopril-hydrochlorothiazide (ZESTORETIC) 20-12.5 MG  tablet Take 2 tablets by mouth daily. 07/12/18 01/09/19 Yes Amyot, Ali Lowe, NP  albuterol (PROVENTIL HFA) 108 (90 Base) MCG/ACT inhaler Inhale 2 puffs into the lungs every 6 (six) hours as needed for wheezing or shortness of breath.    [provider]  furosemide (LASIX) 20 MG tablet Take 1 tablet (20 mg total) by mouth daily. 01/09/19   Janne Napoleon, NP  hydrocortisone cream 1 % Apply to affected area 2 times daily 10/29/17   Bethel Born, PA-C  mupirocin cream (BACTROBAN) 2 % Apply 1 application topically 2 (two) times daily. 01/09/19   Janne Napoleon, NP    Family History Family History  Problem Relation Age of Onset  . Asthma Mother   . Asthma Brother     Social History Social History   Tobacco Use  . Smoking status: Current Some Day Smoker    Types: Cigars  . Smokeless tobacco: Never Used  . Tobacco comment: Smokes 2 Black & Milds daily  Substance Use Topics  . Alcohol use: Yes  . Drug  use: No     Allergies   Patient has no known allergies.   Review of Systems Review of Systems  Constitutional: Negative for chills and fever.  HENT: Negative.   Eyes: Negative for pain, discharge, redness and itching.  Respiratory: Negative for cough and shortness of breath.   Cardiovascular: Negative for chest pain.  Gastrointestinal: Negative for abdominal pain, diarrhea, nausea and vomiting.  Genitourinary: Negative for dysuria, frequency, urgency, vaginal bleeding and vaginal discharge.  Musculoskeletal: Positive for joint swelling. Negative for neck pain and neck stiffness.       Swelling of lower extremities.   Skin: Positive for wound.  Neurological: Negative for syncope and headaches.  Psychiatric/Behavioral: Negative for confusion.     Physical Exam Triage Vital Signs ED Triage Vitals  Enc Vitals Group     BP 01/09/19 1437 137/60     Pulse Rate 01/09/19 1437 99     Resp 01/09/19 1437 20     Temp 01/09/19 1437 98.1 F (36.7 C)     Temp Source 01/09/19  1437 Oral     SpO2 01/09/19 1437 100 %     Weight --      Height --      Head Circumference --      Peak Flow --      Pain Score 01/09/19 1455 8     Pain Loc --      Pain Edu? --      Excl. in GC? --    No data found.  Updated Vital Signs BP 137/60 (BP Location: Left Arm)   Pulse 99   Temp 98.1 F (36.7 C) (Oral)   Resp 20   SpO2 100%   Visual Acuity Right Eye Distance:   Left Eye Distance:   Bilateral Distance:    Right Eye Near:   Left Eye Near:    Bilateral Near:     Physical Exam Vitals signs and nursing note reviewed.  Constitutional:      General: She is not in acute distress.    Appearance: She is well-developed.     Comments: Morbidly obese.  HENT:     Head: Normocephalic.     Nose: Nose normal.     Mouth/Throat:     Mouth: Mucous membranes are moist.  Eyes:     Extraocular Movements: Extraocular movements intact.     Conjunctiva/sclera: Conjunctivae normal.  Neck:     Musculoskeletal: Neck supple.  Cardiovascular:     Rate and Rhythm: Normal rate and regular rhythm.  Pulmonary:     Effort: Pulmonary effort is normal.     Breath sounds: Normal breath sounds. No wheezing, rhonchi or rales.  Chest:     Chest wall: No tenderness.  Musculoskeletal:     Comments: 2+ pitting edema bilateral lower legs. Pedal pulses 2+.  Skin:    General: Skin is warm and dry.  Neurological:     Mental Status: She is alert and oriented to person, place, and time.  Psychiatric:        Mood and Affect: Mood normal.      UC Treatments / Results  Labs (all labs ordered are listed, but only abnormal results are displayed) Labs Reviewed - No data to display Radiology No results found.  Procedures Procedures (including critical care time)  Medications Ordered in UC Medications - No data to display  Initial Impression / Assessment and Plan / UC Course  I have reviewed the triage vital signs and the nursing notes.  33 y.o. female here with bilateral lower leg  edema stable for d/c without shortness of breath, chest pain or other problems. Will give short course of Lasix 20 mg daily and she will f/u with her PCP next week. Return precautions discussed in detail.  Final Clinical Impressions(s) / UC Diagnoses   Final diagnoses:  Bilateral lower extremity edema  Bug bite with infection, initial encounter     Discharge Instructions     Continue your blood pressure medication. We have sent in Rx for Lasix for you to take for a few days until you can follow up with your primary care doctor. If you have problems return as needed. Use the antibiotic ointment on the infected area twice a day.     ED Prescriptions    Medication Sig Dispense Auth. Provider   furosemide (LASIX) 20 MG tablet Take 1 tablet (20 mg total) by mouth daily. 5 tablet Kerrie Buffalo M, NP   mupirocin cream (BACTROBAN) 2 % Apply 1 application topically 2 (two) times daily. 15 g Janne Napoleon, NP     Controlled Substance Prescriptions [place] Controlled Substance Registry consulted? Not Applicable   Janne Napoleon, [place] 01/09/19 1535

## 2019-01-09 NOTE — Discharge Instructions (Addendum)
Continue your blood pressure medication. We have sent in Rx for Lasix for you to take for a few days until you can follow up with your primary care doctor. If you have problems return as needed. Use the antibiotic ointment on the infected area twice a day.

## 2019-06-25 ENCOUNTER — Emergency Department (HOSPITAL_COMMUNITY)
Admission: EM | Admit: 2019-06-25 | Discharge: 2019-06-25 | Disposition: A | Discharge status: Home / Self Care | Payer: Medicaid Other | Attending: Emergency Medicine | Admitting: Emergency Medicine

## 2019-06-25 ENCOUNTER — Emergency Department (HOSPITAL_COMMUNITY): Payer: Medicaid Other

## 2019-06-25 ENCOUNTER — Encounter (HOSPITAL_COMMUNITY): Payer: Self-pay | Admitting: Emergency Medicine

## 2019-06-25 ENCOUNTER — Other Ambulatory Visit: Payer: Self-pay

## 2019-06-25 DIAGNOSIS — Z9884 Bariatric surgery status: Secondary | ICD-10-CM | POA: Diagnosis not present

## 2019-06-25 DIAGNOSIS — R1010 Upper abdominal pain, unspecified: Secondary | ICD-10-CM

## 2019-06-25 DIAGNOSIS — R0789 Other chest pain: Secondary | ICD-10-CM | POA: Diagnosis not present

## 2019-06-25 DIAGNOSIS — F17290 Nicotine dependence, other tobacco product, uncomplicated: Secondary | ICD-10-CM | POA: Diagnosis not present

## 2019-06-25 DIAGNOSIS — Z79899 Other long term (current) drug therapy: Secondary | ICD-10-CM | POA: Insufficient documentation

## 2019-06-25 DIAGNOSIS — R112 Nausea with vomiting, unspecified: Secondary | ICD-10-CM | POA: Insufficient documentation

## 2019-06-25 DIAGNOSIS — R1013 Epigastric pain: Secondary | ICD-10-CM | POA: Diagnosis not present

## 2019-06-25 DIAGNOSIS — J45909 Unspecified asthma, uncomplicated: Secondary | ICD-10-CM | POA: Diagnosis not present

## 2019-06-25 DIAGNOSIS — R1011 Right upper quadrant pain: Secondary | ICD-10-CM | POA: Insufficient documentation

## 2019-06-25 DIAGNOSIS — R1012 Left upper quadrant pain: Secondary | ICD-10-CM | POA: Diagnosis not present

## 2019-06-25 LAB — CBC
HCT: 39.3 % (ref 36.0–46.0)
Hemoglobin: 12.1 g/dL (ref 12.0–15.0)
MCH: 24.4 pg — ABNORMAL LOW (ref 26.0–34.0)
MCHC: 30.8 g/dL (ref 30.0–36.0)
MCV: 79.4 fL — ABNORMAL LOW (ref 80.0–100.0)
Platelets: 349 10*3/uL (ref 150–400)
RBC: 4.95 MIL/uL (ref 3.87–5.11)
RDW: 17.4 % — ABNORMAL HIGH (ref 11.5–15.5)
WBC: 6.5 10*3/uL (ref 4.0–10.5)
nRBC: 0 % (ref 0.0–0.2)

## 2019-06-25 LAB — BASIC METABOLIC PANEL
Anion gap: 12 (ref 5–15)
BUN: 12 mg/dL (ref 6–20)
CO2: 26 mmol/L (ref 22–32)
Calcium: 8.4 mg/dL — ABNORMAL LOW (ref 8.9–10.3)
Chloride: 101 mmol/L (ref 98–111)
Creatinine, Ser: 1 mg/dL (ref 0.44–1.00)
GFR calc Af Amer: >60 mL/min (ref >60)
GFR calc non Af Amer: >60 mL/min (ref >60)
Glucose, Bld: 116 mg/dL — ABNORMAL HIGH (ref 70–99)
Potassium: 3.6 mmol/L (ref 3.5–5.1)
Sodium: 139 mmol/L (ref 135–145)

## 2019-06-25 LAB — I-STAT BETA HCG BLOOD, ED (MC, WL, AP ONLY): I-stat hCG, quantitative: <5 m[IU]/mL (ref <5)

## 2019-06-25 LAB — HEPATIC FUNCTION PANEL
ALT: 26 U/L (ref 0–44)
AST: 25 U/L (ref 15–41)
Albumin: 3.1 g/dL — ABNORMAL LOW (ref 3.5–5.0)
Alkaline Phosphatase: 77 U/L (ref 38–126)
Bilirubin, Direct: <0.1 mg/dL (ref 0.0–0.2)
Total Bilirubin: 0.5 mg/dL (ref 0.3–1.2)
Total Protein: 7 g/dL (ref 6.5–8.1)

## 2019-06-25 LAB — TROPONIN I (HIGH SENSITIVITY)
Troponin I (High Sensitivity): 3 ng/L (ref <18)
Troponin I (High Sensitivity): 3 ng/L (ref <18)

## 2019-06-25 LAB — LIPASE, BLOOD: Lipase: 22 U/L (ref 11–51)

## 2019-06-25 MED ORDER — SUCRALFATE 1 G PO TABS: 1.0000 g | ORAL_TABLET | Freq: Three times a day (TID) | ORAL | 0 refills | Status: DC

## 2019-06-25 MED ORDER — SODIUM CHLORIDE 0.9% FLUSH: 3.0000 mL | Freq: Once | INTRAVENOUS | Status: DC

## 2019-06-25 MED ORDER — LIDOCAINE VISCOUS HCL 2 % MT SOLN
15.0000 mL | Freq: Once | OROMUCOSAL | Status: AC
  Administered 2019-06-25: 15 mL via ORAL
  Filled 2019-06-25: qty 15

## 2019-06-25 MED ORDER — IOPAMIDOL (ISOVUE-300) INJECTION 61%
100.0000 mL | Freq: Once | INTRAVENOUS | Status: AC | PRN
  Administered 2019-06-25: 100 mL via INTRAVENOUS

## 2019-06-25 MED ORDER — OMEPRAZOLE 20 MG PO CPDR: 20.0000 mg | DELAYED_RELEASE_CAPSULE | Freq: Two times a day (BID) | ORAL | 0 refills | Status: DC

## 2019-06-25 MED ORDER — ALUM & MAG HYDROXIDE-SIMETH 200-200-20 MG/5ML PO SUSP
30.0000 mL | Freq: Once | ORAL | Status: AC
  Administered 2019-06-25: 30 mL via ORAL
  Filled 2019-06-25: qty 30

## 2019-06-25 MED ORDER — PROMETHAZINE HCL 25 MG PO TABS
25.0000 mg | ORAL_TABLET | Freq: Once | ORAL | Status: AC
  Administered 2019-06-25: 25 mg via ORAL
  Filled 2019-06-25: qty 1

## 2019-06-25 MED ORDER — ONDANSETRON HCL 4 MG/2ML IJ SOLN
4.0000 mg | Freq: Once | INTRAMUSCULAR | Status: AC
  Administered 2019-06-25: 4 mg via INTRAVENOUS
  Filled 2019-06-25: qty 2

## 2019-06-25 MED ORDER — ONDANSETRON HCL 4 MG/2ML IJ SOLN
4.0000 mg | Freq: Once | INTRAMUSCULAR | Status: AC
  Administered 2019-06-25: 09:00:00 4 mg via INTRAVENOUS
  Filled 2019-06-25: qty 2

## 2019-06-25 MED ORDER — MORPHINE SULFATE (PF) 4 MG/ML IV SOLN
4.0000 mg | Freq: Once | INTRAVENOUS | Status: AC
  Administered 2019-06-25: 07:00:00 4 mg via INTRAVENOUS
  Filled 2019-06-25: qty 1

## 2019-06-25 NOTE — ED Notes (Signed)
Transported to CT 

## 2019-06-25 NOTE — ED Provider Notes (Signed)
[facility]   CSN: [id number] Arrival date & time: 06/25/19  0054    History   Chief Complaint Chief Complaint  Patient presents with   Abdominal Pain    HPI Lacey Whitaker is a 33 y.o. female with a history of morbid obesity s/p gastric bypass in 2009 who presents to the emergency department with a chief complaint of abdominal pain.  The patient reports that she developed sudden onset, severe sharp, aching abdominal pain last night approximately 1 hour after eating soup.  She reports that the pain was initially located in the epigastric region, but then seemed to also worsen under her left breast.  She reports that the pain came on suddenly and radiated to her back.    She reports that she became very nauseated and had 2 episodes of nonbloody, nonbilious vomiting.  The episode made her feel generally weak and very anxious.  She reports that she thought she may be having a heart attack came to the ER for evaluation.  She denies fever, chills, dysuria, hematuria, urinary frequency or hesitancy, vaginal pain or discharge.  Surgical history includes Roux-en-Y gastric bypass 2009.  She reports that she has been having some bright red blood in her stool, but has a known history of internal hemorrhoids.  No melena.  She reports that she was given ibuprofen by her dentist.  She reports that she had to stop taking medication because it was upsetting her stomach, but found her symptoms improved after taking medication while she was eating.  She reports that she drinks alcohol socially.  No treatment prior to arrival.     The history is provided by the patient. No language interpreter was used.    Past Medical History:  Diagnosis Date   Asthma    Cyst, mediastinum    pt. reports having the cyst removed last year and has reoccured   Iron deficiency anemia due to chronic blood loss    Morbid obesity (HCC)    s/p gastric bypass  surgery in 2009   Pregnancy induced hypertension    2006    Patient Active Problem List   Diagnosis Date Noted   Asthma, chronic    Iron deficiency anemia due to chronic blood loss    Obesities, morbid (HCC) 08/10/2011    Past Surgical History:  Procedure Laterality Date   BONE CYST EXCISION  2010   CESAREAN SECTION     CESAREAN SECTION  09/02/2011   Procedure: CESAREAN SECTION;  Surgeon: Purcell NailsAngela Y Roberts, MD;  Location: [facility];  Service: Gynecology;  Laterality: N/A;   GASTRIC BYPASS  05/2008   GASTRIC BYPASS  2009   WISDOM TOOTH EXTRACTION  09/2010     OB History    Gravida  3   Para  3   Term  3   Preterm      AB      Living  3     SAB      TAB      Ectopic      Multiple      Live Births  2            Home Medications    Prior to Admission medications   Medication Sig Start Date End Date Taking? Authorizing Provider  albuterol (PROVENTIL HFA) 108 (90 Base) MCG/ACT inhaler Inhale 2 puffs into the lungs every 6 (six) hours as needed for wheezing or shortness of breath.   Yes [provider]  furosemide (LASIX) 20 MG tablet Take 1 tablet (20 mg total) by mouth daily. 01/09/19  Yes Neese, Bridgette HabermannHope M, NP  levonorgestrel (MIRENA) 20 MCG/24HR IUD 1 each by Intrauterine route once.   Yes [provider]  lisinopril-hydrochlorothiazide (ZESTORETIC) 20-12.5 MG tablet Take 2 tablets by mouth daily. 07/12/18 06/25/19 Yes Amyot, Ali LoweAnn Berry, NP  phentermine (ADIPEX-P) 37.5 MG tablet Take 1 tablet by mouth daily. 06/06/19  Yes [provider]  tiZANidine (ZANAFLEX) 2 MG tablet Take 2 mg by mouth every 6 (six) hours as needed for muscle spasms.   Yes [provider]  Vitamin D, Ergocalciferol, (DRISDOL) 1.25 MG (50000 UT) CAPS capsule Take 50,000 Units by mouth every Monday.   Yes [provider]  hydrocortisone cream 1 % Apply to affected area 2 times daily Patient not taking: Reported on 06/25/2019 10/29/17   Bethel BornGekas, Kelly  Marie, PA-C  mupirocin cream (BACTROBAN) 2 % Apply 1 application topically 2 (two) times daily. Patient not taking: Reported on 06/25/2019 01/09/19   Janne NapoleonNeese, Hope M, NP    Family History Family History  Problem Relation Age of Onset   Asthma Mother    Asthma Brother     Social History Social History   Tobacco Use   Smoking status: Current Some Day Smoker    Types: Cigars   Smokeless tobacco: Never Used   Tobacco comment: Smokes 2 Black & Milds daily  Substance Use Topics   Alcohol use: Yes   Drug use: No     Allergies   Patient has no known allergies.   Review of Systems Review of Systems  Constitutional: Negative for activity change, chills, diaphoresis and fever.  Respiratory: Negative for shortness of breath.   Cardiovascular: Negative for chest pain.  Gastrointestinal: Positive for abdominal pain, nausea and vomiting.  Genitourinary: Negative for dysuria.  Musculoskeletal: Negative for back pain.  Skin: Negative for rash.  Allergic/Immunologic: Negative for immunocompromised state.  Neurological: Negative for headaches.  Psychiatric/Behavioral: Negative for confusion.   Physical Exam Updated Vital Signs BP 134/73 (BP Location: Left Arm)    Pulse 84    Temp 98.6 F (37 C) (Oral)    Resp 18    SpO2 100%   Physical Exam Vitals signs and nursing note reviewed.  Constitutional:      General: She is not in acute distress. HENT:     Head: Normocephalic.  Eyes:     Conjunctiva/sclera: Conjunctivae normal.  Neck:     Musculoskeletal: Neck supple.  Cardiovascular:     Rate and Rhythm: Normal rate and regular rhythm.     Pulses: Normal pulses.     Heart sounds: Normal heart sounds. No murmur. No friction rub. No gallop.   Pulmonary:     Effort: Pulmonary effort is normal. No respiratory distress.     Breath sounds: No stridor. No wheezing, rhonchi or rales.  Chest:     Chest wall: No tenderness.  Abdominal:     General: There is no distension.      Palpations: Abdomen is soft. There is no mass.     Tenderness: There is abdominal tenderness. There is no right CVA tenderness, left CVA tenderness, guarding or rebound.     Hernia: No hernia is present.     Comments: Maximal tenderness palpation in the epigastric region, but she is diffusely tender to palpation to the bilateral upper abdomen.  She has a positive Murphy exam.  No guarding or rebound.  Abdomen is obese, soft, nondistended.  Normoactive bowel sounds in all 4 quadrants.  No tenderness and frequent.  CVA tenderness bilaterally.  Musculoskeletal:     Right lower leg: No edema.     Left lower leg: No edema.  Skin:    General: Skin is warm.     Findings: No rash.  Neurological:     Mental Status: She is alert.  Psychiatric:        Behavior: Behavior normal.    ED Treatments / Results  Labs (all labs ordered are listed, but only abnormal results are displayed) Labs Reviewed  BASIC METABOLIC PANEL - Abnormal; Notable for the following components:      Result Value   Glucose, Bld 116 (*)    Calcium 8.4 (*)    All other components within normal limits  CBC - Abnormal; Notable for the following components:   MCV 79.4 (*)    MCH 24.4 (*)    RDW 17.4 (*)    All other components within normal limits  HEPATIC FUNCTION PANEL - Abnormal; Notable for the following components:   Albumin 3.1 (*)    All other components within normal limits  LIPASE, BLOOD  I-STAT BETA HCG BLOOD, ED ([facility], [facility], [facility] ONLY)  TROPONIN I (HIGH SENSITIVITY)  TROPONIN I (HIGH SENSITIVITY)    EKG EKG Interpretation  Date/Time:  Wednesday June 25 2019 01:04:53 EDT Ventricular Rate:  78 PR Interval:  158 QRS Duration: 84 QT Interval:  386 QTC Calculation: 440 R Axis:   64 Text Interpretation:  Normal sinus rhythm with sinus arrhythmia Normal ECG Confirmed by Thayer Jew [phone]) on 06/25/2019 5:16:19 AM Also confirmed by Thayer Jew [phone]), editor Hattie Perch ([id number])  on 06/25/2019  7:21:36 AM   Radiology Dg Chest 2 View  Result Date: 06/25/2019 CLINICAL DATA:  Central chest pain for 1 hour EXAM: CHEST - 2 VIEW COMPARISON:  05/15/2017 FINDINGS: Cardiac shadow is within normal limits. The lungs are well aerated bilaterally. No focal infiltrate or sizable effusion is seen. No bony abnormality is noted. IMPRESSION: No active cardiopulmonary disease. Electronically Signed   By: Inez Catalina M.D.   On: 06/25/2019 01:32   Ct Abdomen Pelvis W Contrast  Result Date: 06/25/2019 CLINICAL DATA:  Right upper quadrant pain, cholecystitis suspected EXAM: CT ABDOMEN AND PELVIS WITH CONTRAST TECHNIQUE: Multidetector CT imaging of the abdomen and pelvis was performed using the standard protocol following bolus administration of intravenous contrast. CONTRAST:  167mL ISOVUE-300 IOPAMIDOL (ISOVUE-300) INJECTION 61% COMPARISON:  None. FINDINGS: Lower chest: No acute abnormality. Hepatobiliary: No solid liver abnormality is seen. Hepatomegaly and hepatic steatosis. No gallstones, gallbladder wall thickening, or biliary dilatation. Pancreas: Unremarkable. No pancreatic ductal dilatation or surrounding inflammatory changes. Spleen: Normal in size without significant abnormality. Adrenals/Urinary Tract: Adrenal glands are unremarkable. Kidneys are normal, without renal calculi, solid lesion, or hydronephrosis. Bladder is unremarkable. Stomach/Bowel: Status post Roux-en-Y gastric bypass. Appendix appears normal. No evidence of bowel wall thickening, distention, or inflammatory changes. Vascular/Lymphatic: Infrarenal IVC filter. No enlarged abdominal or pelvic lymph nodes. Reproductive: No mass or other significant abnormality. IUD present in the endometrial cavity. Other: No abdominal wall hernia or abnormality. No abdominopelvic ascites. Musculoskeletal: No acute or significant osseous findings. IMPRESSION: 1. No acute CT finding of the abdomen or pelvis to explain right upper quadrant pain. There is no CT  evidence of gallstones, gallbladder wall thickening, or biliary ductal dilatation. Consider ultrasound to further evaluate if there is high persistent clinical suspicion for cholecystitis. 2.  Hepatomegaly and hepatic steatosis. 3.  Postoperative findings of Roux-en-Y gastric bypass. 4.  IVC filter. Electronically Signed   By: Lauralyn PrimesAlex  Bibbey M.D.   On: 06/25/2019 08:20    Procedures Procedures (including critical care time)  Medications Ordered in ED Medications  sodium chloride flush (NS) 0.9 % injection 3 mL (has no administration in time range)  alum & mag hydroxide-simeth (MAALOX/MYLANTA) 200-200-20 MG/5ML suspension 30 mL (has no administration in time range)    And  lidocaine (XYLOCAINE) 2 % viscous mouth solution 15 mL (has no administration in time range)  ondansetron (ZOFRAN) injection 4 mg (has no administration in time range)  morphine 4 MG/ML injection 4 mg (4 mg Intravenous Given 06/25/19 0633)  ondansetron (ZOFRAN) injection 4 mg (4 mg Intravenous Given 06/25/19 [id number])  iopamidol (ISOVUE-300) 61 % injection 100 mL (100 mLs Intravenous Contrast Given 06/25/19 0756)     Initial Impression / Assessment and Plan / ED Course  I have reviewed the triage vital signs and the nursing notes.  Pertinent labs & imaging results that were available during my care of the patient were reviewed by me and considered in my medical decision making (see chart for details).        33 year old female with a history of morbid obesity s/p gastric bypass in 2009 presenting with epigastric pain radiating to the back accompanied by nausea and vomiting that began earlier tonight.  She was worked up for chest pain, but when she is asked to point to areas of discomfort, she points to the epigastric region of the abdomen.  EKG is normal sinus rhythm and troponin are unremarkable.  Chest x-ray is unremarkable.  She appears to have a microcytic anemia, but no leukocytosis.  Hemoglobin is normal at 12.1.  Metabolic  panel is unremarkable, but LFTs ordered initially.  Will add these on given her upper abdominal pain.  Given sudden onset of symptoms, she would benefit from CT abdomen pelvis since she is morbidly obese And evaluate for acute cholecystitis.  If unremarkable, she would benefit from omeprazole and Carafate with follow-up to gastroenterology for questionable peptic ulcer disease.  Doubt perforated viscus, pancreatitis, PID, ACS, pyelonephritis, ovarian torsion, or ectopic pregnancy.  [facility] the end of my shift. Patient presentation, ED course, and plan of care discussed with review of all pertinent labs and imaging. Please see his/her note for further details regarding further ED course and disposition.   Final Clinical Impressions(s) / ED Diagnoses   Final diagnoses:  None    ED Discharge Orders    None       Barkley BoardsMcDonald, Monisha Siebel A, PA-C 06/25/19 [id number]    Shon BatonHorton, Courtney F, MD 06/29/19 2255

## 2019-06-25 NOTE — ED Provider Notes (Signed)
abd pain since last night, needing CT. If neg for gallbladder or acute pathology, pt d/c with carafate and omeprazole.    Labs are reassuring, Negative delta troponin therefore low suspicion of ACS.  Abdominal pelvis CT scan without any acute pathology.  However patient still endorse upper abdominal pain, nauseous and unable to keep anything down.  On reexamination tenderness to right upper quadrant.  Will obtain abdominal ultrasound and will give GI cocktail and continue to monitor.  9:45 AM Abdominal ultrasound does not demonstrate any evidence of acute cholecystitis.  Patient did report improvement of her symptoms with GI cocktail but still having some discomfort.  She otherwise well-appearing.  At this time, she is stable for discharge.  Will prescribe Carafate and omeprazole and give patient referral to GI specialist for further evaluation.  Return precaution discussed.  BP 121/79   Pulse 68   Temp 98.6 F (37 C) (Oral)   Resp 14   SpO2 100%   Results for orders placed or performed during the hospital encounter of 06/25/19  Basic metabolic panel  Result Value Ref Range   Sodium 139 135 - 145 mmol/L   Potassium 3.6 3.5 - 5.1 mmol/L   Chloride 101 98 - 111 mmol/L   CO2 26 22 - 32 mmol/L   Glucose, Bld 116 (H) 70 - 99 mg/dL   BUN 12 6 - 20 mg/dL   Creatinine, Ser 1.611.00 0.44 - 1.00 mg/dL   Calcium 8.4 (L) 8.9 - 10.3 mg/dL   GFR calc non Af Amer >60 >60 mL/min   GFR calc Af Amer >60 >60 mL/min   Anion gap 12 5 - 15  CBC  Result Value Ref Range   WBC 6.5 4.0 - 10.5 K/uL   RBC 4.95 3.87 - 5.11 MIL/uL   Hemoglobin 12.1 12.0 - 15.0 g/dL   HCT 09.639.3 04.536.0 - 40.946.0 %   MCV 79.4 (L) 80.0 - 100.0 fL   MCH 24.4 (L) 26.0 - 34.0 pg   MCHC 30.8 30.0 - 36.0 g/dL   RDW 81.117.4 (H) 91.411.5 - 78.215.5 %   Platelets 349 150 - 400 K/uL   nRBC 0.0 0.0 - 0.2 %  Lipase, blood  Result Value Ref Range   Lipase 22 11 - 51 U/L  Hepatic function panel  Result Value Ref Range   Total Protein 7.0 6.5 - 8.1 g/dL    Albumin 3.1 (L) 3.5 - 5.0 g/dL   AST 25 15 - 41 U/L   ALT 26 0 - 44 U/L   Alkaline Phosphatase 77 38 - 126 U/L   Total Bilirubin 0.5 0.3 - 1.2 mg/dL   Bilirubin, Direct <9.5<0.1 0.0 - 0.2 mg/dL   Indirect Bilirubin NOT CALCULATED 0.3 - 0.9 mg/dL  I-Stat beta hCG blood, ED  Result Value Ref Range   I-stat hCG, quantitative <5.0 <5 mIU/mL   Comment 3          Troponin I (High Sensitivity)  Result Value Ref Range   Troponin I (High Sensitivity) 3 <18 ng/L  Troponin I (High Sensitivity)  Result Value Ref Range   Troponin I (High Sensitivity) 3 <18 ng/L   Dg Chest 2 View  Result Date: 06/25/2019 CLINICAL DATA:  Central chest pain for 1 hour EXAM: CHEST - 2 VIEW COMPARISON:  05/15/2017 FINDINGS: Cardiac shadow is within normal limits. The lungs are well aerated bilaterally. No focal infiltrate or sizable effusion is seen. No bony abnormality is noted. IMPRESSION: No active cardiopulmonary disease. Electronically Signed  By: Inez Catalina M.D.   On: 06/25/2019 01:32   Ct Abdomen Pelvis W Contrast  Result Date: 06/25/2019 CLINICAL DATA:  Right upper quadrant pain, cholecystitis suspected EXAM: CT ABDOMEN AND PELVIS WITH CONTRAST TECHNIQUE: Multidetector CT imaging of the abdomen and pelvis was performed using the standard protocol following bolus administration of intravenous contrast. CONTRAST:  170mL ISOVUE-300 IOPAMIDOL (ISOVUE-300) INJECTION 61% COMPARISON:  None. FINDINGS: Lower chest: No acute abnormality. Hepatobiliary: No solid liver abnormality is seen. Hepatomegaly and hepatic steatosis. No gallstones, gallbladder wall thickening, or biliary dilatation. Pancreas: Unremarkable. No pancreatic ductal dilatation or surrounding inflammatory changes. Spleen: Normal in size without significant abnormality. Adrenals/Urinary Tract: Adrenal glands are unremarkable. Kidneys are normal, without renal calculi, solid lesion, or hydronephrosis. Bladder is unremarkable. Stomach/Bowel: Status post Roux-en-Y  gastric bypass. Appendix appears normal. No evidence of bowel wall thickening, distention, or inflammatory changes. Vascular/Lymphatic: Infrarenal IVC filter. No enlarged abdominal or pelvic lymph nodes. Reproductive: No mass or other significant abnormality. IUD present in the endometrial cavity. Other: No abdominal wall hernia or abnormality. No abdominopelvic ascites. Musculoskeletal: No acute or significant osseous findings. IMPRESSION: 1. No acute CT finding of the abdomen or pelvis to explain right upper quadrant pain. There is no CT evidence of gallstones, gallbladder wall thickening, or biliary ductal dilatation. Consider ultrasound to further evaluate if there is high persistent clinical suspicion for cholecystitis. 2.  Hepatomegaly and hepatic steatosis. 3.  Postoperative findings of Roux-en-Y gastric bypass. 4.  IVC filter. Electronically Signed   By: Eddie Candle M.D.   On: 06/25/2019 08:20   US Abdomen Limited  Result Date: 06/25/2019 CLINICAL DATA:  Right upper quadrant abdominal pain EXAM: ULTRASOUND ABDOMEN LIMITED RIGHT UPPER QUADRANT COMPARISON:  Same-day CT FINDINGS: Gallbladder: No gallstones or wall thickening visualized. No sonographic Murphy sign noted by sonographer. Common bile duct: Diameter: 6 mm Liver: No focal lesion identified. Increased hepatic parenchymal echogenicity. Portal vein is patent on color Doppler imaging with normal direction of blood flow towards the liver. Other: None. IMPRESSION: 1. No sonographic evidence of acute cholecystitis. 2. The echogenicity of the liver is increased. This is a nonspecific finding but is most commonly seen with fatty infiltration of the liver. There are no obvious focal liver lesions. Electronically Signed   By: Davina Poke M.D.   On: 06/25/2019 09:13      Domenic Moras, PA-C 06/25/19 1018    Long, Wonda Olds, MD 06/25/19 2017

## 2019-06-25 NOTE — ED Triage Notes (Signed)
Pt c/o central, non-radiating chest pain that started 1 hour PTA. Hx asthma, c/o mild shortness of breath. Denies cough/fever, no known sick contacts.

## 2020-01-12 ENCOUNTER — Ambulatory Visit (HOSPITAL_COMMUNITY)
Admission: EM | Admit: 2020-01-12 | Discharge: 2020-01-12 | Disposition: A | Discharge status: Home / Self Care | Payer: PRIVATE HEALTH INSURANCE | Attending: Physician Assistant | Admitting: Physician Assistant

## 2020-01-12 ENCOUNTER — Encounter (HOSPITAL_COMMUNITY): Payer: Self-pay

## 2020-01-12 ENCOUNTER — Other Ambulatory Visit: Payer: Self-pay

## 2020-01-12 DIAGNOSIS — L729 Follicular cyst of the skin and subcutaneous tissue, unspecified: Secondary | ICD-10-CM | POA: Diagnosis not present

## 2020-01-12 MED ORDER — LIDOCAINE 5 % EX OINT: 1.0000 "application " | TOPICAL_OINTMENT | Freq: Every day | CUTANEOUS | 0 refills | Status: DC | PRN

## 2020-01-12 MED ORDER — DICLOFENAC SODIUM 1 % EX GEL: 4.0000 g | Freq: Four times a day (QID) | CUTANEOUS | 0 refills | Status: DC

## 2020-01-12 MED ORDER — LIDOCAINE 5 % EX OINT: 1.0000 "application " | TOPICAL_OINTMENT | CUTANEOUS | 0 refills | Status: DC | PRN

## 2020-01-12 NOTE — Discharge Instructions (Addendum)
I have sent in a referral to dermatology, please give him 1 to 2 weeks to contact you.  If you have not heard from them please call that number associated with the referral.  You may also further discuss this with your primary care if you have an appoint with them prior to being seen by dermatology  I have sent in 2 medications.  1 is a lidocaine ointment and I would like for you to apply this directly to the area of pain as needed throughout the day up to 3 times.  The other medication is Voltaren gel and you can apply this directly to the area of pain up to 4 times a day.  Wear loose fitting clothes around the area of pain and you may apply ice to the area for the next 1 to 2 days.

## 2020-01-12 NOTE — ED Provider Notes (Signed)
[place]    CSN: [id number] Arrival date & time: 01/12/20  1329      History   Chief Complaint Chief Complaint  Patient presents with  . Cyst    right side underneath breast    HPI Lacey Whitaker is a 34 y.o. female.   Patient presents to urgent care for complaint of right-sided skin cyst that is painful.  States that there has been a cyst present in this location for 1 year.  She notes that it has grown somewhat.  It has never been painful prior to the last 3 to 5 days.  She reports that it is most painful when there are close fitting close over top of it.  She denies drainage or redness in the area.  Denies fever and chills.     Past Medical History:  Diagnosis Date  . Asthma   . Cyst, mediastinum    pt. reports having the cyst removed last year and has reoccured  . Iron deficiency anemia due to chronic blood loss   . Morbid obesity ([place])    s/p gastric bypass surgery in 2009  . Pregnancy induced hypertension    2006    Patient Active Problem List   Diagnosis Date Noted  . Asthma, chronic   . Iron deficiency anemia due to chronic blood loss   . Obesities, morbid ([place]) 08/10/2011    Past Surgical History:  Procedure Laterality Date  . BONE CYST EXCISION  2010  . CESAREAN SECTION    . CESAREAN SECTION  09/02/2011   Procedure: CESAREAN SECTION;  Surgeon: Delice Lesch, MD;  Location: [facility];  Service: Gynecology;  Laterality: N/A;  . GASTRIC BYPASS  05/2008  . GASTRIC BYPASS  2009  . WISDOM TOOTH EXTRACTION  09/2010    OB History    Gravida  3   Para  3   Term  3   Preterm      AB      Living  3     SAB      TAB      Ectopic      Multiple      Live Births  2            Home Medications    Prior to Admission medications   Medication Sig Start Date End Date Taking? Authorizing Provider  albuterol (PROVENTIL HFA) 108 (90 Base) MCG/ACT inhaler Inhale 2 puffs into the lungs every 6 (six) hours as needed for wheezing  or shortness of breath.    [provider]  diclofenac Sodium (VOLTAREN) 1 % GEL Apply 4 g topically 4 (four) times daily. 01/12/20   Rebecka Oelkers, Marguerita Beards, PA-C  furosemide (LASIX) 20 MG tablet Take 1 tablet (20 mg total) by mouth daily. 01/09/19   Ashley Murrain, NP  levonorgestrel (MIRENA) 20 MCG/24HR IUD 1 each by Intrauterine route once.    [provider]  lidocaine (XYLOCAINE) 5 % ointment Apply 1 application topically daily as needed. 01/12/20   Dorothee Napierkowski, Marguerita Beards, PA-C  lisinopril-hydrochlorothiazide (ZESTORETIC) 20-12.5 MG tablet Take 2 tablets by mouth daily. 07/12/18 06/25/19  Katy Apo, NP  omeprazole (PRILOSEC) 20 MG capsule Take 1 capsule (20 mg total) by mouth 2 (two) times daily before a meal. 06/25/19   Domenic Moras, PA-C  phentermine (ADIPEX-P) 37.5 MG tablet Take 1 tablet by mouth daily. 06/06/19   [provider]  sucralfate (CARAFATE) 1 g tablet Take 1 tablet (1 g total) by  mouth 4 (four) times daily -  with meals and at bedtime. 06/25/19   Fayrene Helper, PA-C  tiZANidine (ZANAFLEX) 2 MG tablet Take 2 mg by mouth every 6 (six) hours as needed for muscle spasms.    [provider]  Vitamin D, Ergocalciferol, (DRISDOL) 1.25 MG (50000 UT) CAPS capsule Take 50,000 Units by mouth every Monday.    [provider]    Family History Family History  Problem Relation Age of Onset  . Asthma Mother   . Asthma Brother     Social History Social History   Tobacco Use  . Smoking status: Current Some Day Smoker    Types: Cigars  . Smokeless tobacco: Never Used  . Tobacco comment: Smokes 2 Black & Milds daily  Substance Use Topics  . Alcohol use: Yes  . Drug use: No     Allergies   Patient has no known allergies.   Review of Systems Review of Systems  Constitutional: Negative for chills and fever.  Skin: Negative for color change, rash and wound.       Positive for cyst   All other systems reviewed and are negative.    Physical Exam Triage  Vital Signs ED Triage Vitals [01/12/20 1427]  Enc Vitals Group     BP 129/81     Pulse Rate 85     Resp 16     Temp 98.3 F (36.8 C)     Temp Source Oral     SpO2      Weight      Height      Head Circumference      Peak Flow      Pain Score      Pain Loc      Pain Edu?      Excl. in GC?    No data found.  Updated Vital Signs BP 129/81 (BP Location: Right Arm)   Pulse 85   Temp 98.3 F (36.8 C) (Oral)   Resp 16   Visual Acuity Right Eye Distance:   Left Eye Distance:   Bilateral Distance:    Right Eye Near:   Left Eye Near:    Bilateral Near:     Physical Exam Vitals and nursing note reviewed.  Constitutional:      General: She is not in acute distress.    Appearance: She is well-developed. She is obese.  HENT:     Head: Normocephalic and atraumatic.  Eyes:     General: No scleral icterus.    Extraocular Movements: Extraocular movements intact.     Conjunctiva/sclera: Conjunctivae normal.     Pupils: Pupils are equal, round, and reactive to light.  Cardiovascular:     Rate and Rhythm: Normal rate.  Pulmonary:     Effort: Pulmonary effort is normal. No respiratory distress.  Abdominal:     Palpations: Abdomen is soft.     Tenderness: There is no abdominal tenderness.  Musculoskeletal:     Cervical back: Normal range of motion.  Skin:    General: Skin is warm and dry.     Comments: 2 cm by 2 cm mobile superficial mass on her right lower thorax and upper abdomen.  Mass is hard without central fluctuance.  No erythema overlying mass.  Mass is tender to palpation.  Neurological:     General: No focal deficit present.     Mental Status: She is alert and oriented to person, place, and time.  Psychiatric:  Mood and Affect: Mood normal.        Behavior: Behavior normal.        Thought Content: Thought content normal.        Judgment: Judgment normal.      UC Treatments / Results  Labs (all labs ordered are listed, but only abnormal results are  displayed) Labs Reviewed - No data to display  EKG   Radiology No results found.  Procedures Procedures (including critical care time)  Medications Ordered in UC Medications - No data to display  Initial Impression / Assessment and Plan / UC Course  I have reviewed the triage vital signs and the nursing notes.  Pertinent labs & imaging results that were available during my care of the patient were reviewed by me and considered in my medical decision making (see chart for details).     #Skin cyst Patient is a 34 year old female presenting with mass on her right side that is consistent with a cyst.  No evidence of infection currently.  Believe that irritation and pain are secondary to increased size as well as tight fitting clothes over the cyst.  Discussed with patient that this is nothing that should be removed in urgent care setting.  Ambulatory referral to dermatology given. -Lidocaine ointment and diclofenac gel for local analgesia and anesthesia -Follow-up with dermatology and your primary care for further evaluation.  Final Clinical Impressions(s) / UC Diagnoses   Final diagnoses:  Skin cyst     Discharge Instructions     I have sent in a referral to dermatology, please give him 1 to 2 weeks to contact you.  If you have not heard from them please call that number associated with the referral.  You may also further discuss this with your primary care if you have an appoint with them prior to being seen by dermatology  I have sent in 2 medications.  1 is a lidocaine ointment and I would like for you to apply this directly to the area of pain as needed throughout the day up to 3 times.  The other medication is Voltaren gel and you can apply this directly to the area of pain up to 4 times a day.  Wear loose fitting clothes around the area of pain and you may apply ice to the area for the next 1 to 2 days.      ED Prescriptions    Medication Sig Dispense Auth.  Provider   lidocaine (XYLOCAINE) 5 % ointment  (Status: Discontinued) Apply 1 application topically as needed. 35.44 g Jalil Lorusso, Veryl Speak, PA-C   diclofenac Sodium (VOLTAREN) 1 % GEL Apply 4 g topically 4 (four) times daily. 100 g Chevis Weisensel, Veryl Speak, PA-C   lidocaine (XYLOCAINE) 5 % ointment Apply 1 application topically daily as needed. 35 g Maziah Smola, Veryl Speak, PA-C     PDMP not reviewed this encounter.   Hermelinda Medicus, PA-C 01/12/20 1901

## 2020-01-12 NOTE — ED Triage Notes (Signed)
Pt present a cyst that is located on the right side underneath her breast. Pt noticed this cyst a year ago. It started off small but has progressively gotten bigger and painful to the touch

## 2020-01-19 ENCOUNTER — Ambulatory Visit (INDEPENDENT_AMBULATORY_CARE_PROVIDER_SITE_OTHER): Payer: No Typology Code available for payment source | Admitting: Nurse Practitioner

## 2020-01-19 ENCOUNTER — Other Ambulatory Visit: Payer: Self-pay

## 2020-01-19 VITALS — BP 112/80 | HR 80 | Temp 98.5°F | Resp 18 | Ht 67.5 in | Wt 373.4 lb

## 2020-01-19 DIAGNOSIS — Z7689 Persons encountering health services in other specified circumstances: Secondary | ICD-10-CM

## 2020-01-19 DIAGNOSIS — I10 Essential (primary) hypertension: Secondary | ICD-10-CM

## 2020-01-19 DIAGNOSIS — D500 Iron deficiency anemia secondary to blood loss (chronic): Secondary | ICD-10-CM

## 2020-01-19 DIAGNOSIS — L02219 Cutaneous abscess of trunk, unspecified: Secondary | ICD-10-CM

## 2020-01-19 DIAGNOSIS — Z13220 Encounter for screening for lipoid disorders: Secondary | ICD-10-CM | POA: Diagnosis not present

## 2020-01-19 DIAGNOSIS — Z23 Encounter for immunization: Secondary | ICD-10-CM

## 2020-01-19 DIAGNOSIS — Z13228 Encounter for screening for other metabolic disorders: Secondary | ICD-10-CM

## 2020-01-19 DIAGNOSIS — Z124 Encounter for screening for malignant neoplasm of cervix: Secondary | ICD-10-CM

## 2020-01-19 DIAGNOSIS — L03319 Cellulitis of trunk, unspecified: Secondary | ICD-10-CM

## 2020-01-19 MED ORDER — OXYCODONE HCL 5 MG PO TABS: 5.0000 mg | ORAL_TABLET | ORAL | 0 refills | Status: DC | PRN

## 2020-01-19 MED ORDER — DOXYCYCLINE HYCLATE 100 MG PO TABS: 100.0000 mg | ORAL_TABLET | Freq: Two times a day (BID) | ORAL | 0 refills | Status: DC

## 2020-01-19 NOTE — Progress Notes (Signed)
New Patient Office Visit  Subjective:  Patient ID: Lacey Whitaker, female    DOB: 01/07/1986  Age: 34 y.o. MRN: [id number]  CC:  Chief Complaint  Patient presents with  . Establish Care    NP    HPI Lacey Whitaker is a 34 year-old African-American female presenting to establish care. She has not completed a pcp visit in a while. She reported recent Urgent care visit for area just under breast that was enlarged and painful to which they instructed her to take tylenol, use steroid cream, and follow up with a primary care provider. Today she woke and the lesion had opened and was draining. She did not recall color, no odor, no fever/chills, increased temperature and very painful. She has tried no other tx. She denied know injury. Will complete labs today to establish baseline. Hx reviewed of asthma and IDA.  The Pt has had an IUD in place for 5 years without GYN and she desires referral. She had [facility] about 3 weeks ago with occasional missing and breakthrough.   Past Medical History:  Diagnosis Date  . Asthma   . Cyst, mediastinum    pt. reports having the cyst removed last year and has reoccured  . Iron deficiency anemia due to chronic blood loss   . Morbid obesity ([facility])    s/p gastric bypass surgery in 2009  . Pregnancy induced hypertension    2006    Past Surgical History:  Procedure Laterality Date  . BONE CYST EXCISION  2010  . CESAREAN SECTION    . CESAREAN SECTION  09/02/2011   Procedure: CESAREAN SECTION;  Surgeon: Purcell Nails, MD;  Location: [facility];  Service: Gynecology;  Laterality: N/A;  . GASTRIC BYPASS  05/2008  . GASTRIC BYPASS  2009  . WISDOM TOOTH EXTRACTION  09/2010    Family History  Problem Relation Age of Onset  . Asthma Mother   . Asthma Brother     Social History   Socioeconomic History  . Marital status: Single    Spouse name: Not on file  . Number of children: Not on file  . Years of education: Not on file  . Highest education level: Not  on file  Occupational History  . Not on file  Tobacco Use  . Smoking status: Current Some Day Smoker    Types: Cigars  . Smokeless tobacco: Never Used  . Tobacco comment: Smokes 2 Black & Milds daily  Substance and Sexual Activity  . Alcohol use: Yes  . Drug use: No  . Sexual activity: Yes    Birth control/protection: I.U.D.  Other Topics Concern  . Not on file  Social History Narrative   Works at [employer].   3 children: Ages 81,6,2 y/o    Lives with her Mom, her brother, and her 3 children.       Social Determinants of Health   Financial Resource Strain:   . Difficulty of Paying Living Expenses: Not on file  Food Insecurity:   . Worried About Programme researcher, broadcasting/film/video in the Last Year: Not on file  . Ran Out of Food in the Last Year: Not on file  Transportation Needs:   . Lack of Transportation (Medical): Not on file  . Lack of Transportation (Non-Medical): Not on file  Physical Activity:   . Days of Exercise per Week: Not on file  . Minutes of Exercise per Session: Not on file  Stress:   . Feeling of Stress :  Not on file  Social Connections:   . Frequency of Communication with Friends and Family: Not on file  . Frequency of Social Gatherings with Friends and Family: Not on file  . Attends Religious Services: Not on file  . Active Member of Clubs or Organizations: Not on file  . Attends Banker Meetings: Not on file  . Marital Status: Not on file  Intimate Partner Violence:   . Fear of Current or Ex-Partner: Not on file  . Emotionally Abused: Not on file  . Physically Abused: Not on file  . Sexually Abused: Not on file    ROS Review of Systems  All other systems reviewed and are negative.   Objective:   Today's Vitals: BP 112/80 (BP Location: Left Arm, Patient Position: Sitting, Cuff Size: Large)   Pulse 80   Temp 98.5 F (36.9 C) (Oral)   Resp 18   Ht 5' 7.5" (1.715 m)   Wt (!) 373 lb 6.4 oz (169.4 kg)   SpO2 99%   BMI 57.62 kg/m    Physical Exam Vitals and nursing note reviewed.  Constitutional:      Appearance: Normal appearance. She is well-developed, well-groomed and overweight.  HENT:     Head: Normocephalic.     Right Ear: Tympanic membrane, ear canal and external ear normal.     Left Ear: Tympanic membrane, ear canal and external ear normal.     Nose: Nose normal.     Mouth/Throat:     Lips: Pink.     Mouth: Mucous membranes are moist.     Dentition: Normal dentition.     Tongue: No lesions. Tongue does not deviate from midline.     Pharynx: Oropharynx is clear.  Eyes:     General: Lids are normal. Lids are everted, no foreign bodies appreciated.     Extraocular Movements: Extraocular movements intact.     Conjunctiva/sclera: Conjunctivae normal.     Pupils: Pupils are equal, round, and reactive to light.  Neck:     Thyroid: No thyromegaly.  Cardiovascular:     Rate and Rhythm: Normal rate and regular rhythm.     Pulses: Normal pulses.     Heart sounds: Normal heart sounds, S1 normal and S2 normal.  Pulmonary:     Effort: Pulmonary effort is normal.     Breath sounds: Normal breath sounds.  Abdominal:     General: Bowel sounds are normal.     Palpations: Abdomen is soft.     Tenderness: There is no abdominal tenderness.  Musculoskeletal:        General: Normal range of motion.     Cervical back: Normal range of motion and neck supple.     Right lower leg: No edema.     Left lower leg: No edema.  Lymphadenopathy:     Cervical: No cervical adenopathy.  Skin:    General: Skin is warm and dry.     Capillary Refill: Capillary refill takes less than 2 seconds.     Findings: Abscess present.          Comments: Opened area no visual drainage no foul odor, increase temperature and erythema. Approximate size is round 3cm/3cm  Neurological:     General: No focal deficit present.     Mental Status: She is alert and oriented to person, place, and time.  Psychiatric:        Attention and  Perception: Attention normal.        Mood and  Affect: Mood and affect normal.        Behavior: Behavior normal. Behavior is cooperative.        Cognition and Memory: Cognition normal. Memory is not impaired.     Assessment & Plan:  Established care today abscess with cellulitis and pain to right chest just under breast: pain med and antibiotic with dressing instructions. If increases or fails to resolve return.  Complete labs today will call with abnormal Make general exam appt in 6 months repeat labs one week prior to the visit Print out provided for today's diagnosis, healthy diet and exercise Flu vaccine completed today Continue current management of asthma and HTN and IDA  Problem List Items Addressed This Visit      Other   Obesities, morbid ([facility]) (Chronic)   Relevant Orders   CBC with Differential/Platelet   COMPLETE METABOLIC PANEL WITH GFR   Lipid panel   Iron deficiency anemia due to chronic blood loss   Relevant Orders   CBC with Differential/Platelet    Other Visit Diagnoses    Encounter to establish care    -  Primary   Relevant Orders   CBC with Differential/Platelet   COMPLETE METABOLIC PANEL WITH GFR   Lipid panel   Lipid screening       Relevant Orders   Lipid panel   Screening for metabolic disorder       Relevant Orders   COMPLETE METABOLIC PANEL WITH GFR   Cellulitis and abscess of trunk       Relevant Medications   doxycycline (VIBRA-TABS) 100 MG tablet   oxyCODONE (ROXICODONE) 5 MG immediate release tablet   Flu vaccine need       Hypertension, unspecified type       Cervical cancer screening       Relevant Orders   Ambulatory referral to Obstetrics / Gynecology      Outpatient Encounter Medications as of 01/19/2020  Medication Sig  . albuterol (PROVENTIL HFA) 108 (90 Base) MCG/ACT inhaler Inhale 2 puffs into the lungs every 6 (six) hours as needed for wheezing or shortness of breath.  . diclofenac Sodium (VOLTAREN) 1 % GEL Apply 4 g topically  4 (four) times daily.  . furosemide (LASIX) 20 MG tablet Take 1 tablet (20 mg total) by mouth daily.  Marland Kitchen levonorgestrel (MIRENA) 20 MCG/24HR IUD 1 each by Intrauterine route once.  Marland Kitchen lisinopril-hydrochlorothiazide (ZESTORETIC) 20-12.5 MG tablet Take 2 tablets by mouth daily.  Marland Kitchen tiZANidine (ZANAFLEX) 2 MG tablet Take 2 mg by mouth every 6 (six) hours as needed for muscle spasms.  . Vitamin D, Ergocalciferol, (DRISDOL) 1.25 MG (50000 UT) CAPS capsule Take 50,000 Units by mouth every Monday.  Marland Kitchen doxycycline (VIBRA-TABS) 100 MG tablet Take 1 tablet (100 mg total) by mouth 2 (two) times daily.  Marland Kitchen oxyCODONE (ROXICODONE) 5 MG immediate release tablet Take 1 tablet (5 mg total) by mouth every 4 (four) hours as needed for up to 3 days for severe pain.  . [DISCONTINUED] lidocaine (XYLOCAINE) 5 % ointment Apply 1 application topically daily as needed.  . [DISCONTINUED] omeprazole (PRILOSEC) 20 MG capsule Take 1 capsule (20 mg total) by mouth 2 (two) times daily before a meal.  . [DISCONTINUED] phentermine (ADIPEX-P) 37.5 MG tablet Take 1 tablet by mouth daily.  . [DISCONTINUED] sucralfate (CARAFATE) 1 g tablet Take 1 tablet (1 g total) by mouth 4 (four) times daily -  with meals and at bedtime.   No facility-administered encounter medications on file as of  01/19/2020.    Follow-up: Return in about 6 months (around 07/21/2020), or if symptoms worsen or fail to improve and 6 months for CPE labs one week prior.   Annie Main, FNP

## 2020-01-20 ENCOUNTER — Other Ambulatory Visit: Payer: Self-pay | Admitting: Nurse Practitioner

## 2020-01-20 DIAGNOSIS — R739 Hyperglycemia, unspecified: Secondary | ICD-10-CM

## 2020-01-20 DIAGNOSIS — D500 Iron deficiency anemia secondary to blood loss (chronic): Secondary | ICD-10-CM

## 2020-01-20 DIAGNOSIS — E7800 Pure hypercholesterolemia, unspecified: Secondary | ICD-10-CM

## 2020-01-20 DIAGNOSIS — I10 Essential (primary) hypertension: Secondary | ICD-10-CM

## 2020-01-20 LAB — CBC WITH DIFFERENTIAL/PLATELET
Absolute Monocytes: 607 cells/uL (ref 200–950)
Basophils Absolute: 41 cells/uL (ref 0–200)
Basophils Relative: 0.5 %
Eosinophils Absolute: 271 cells/uL (ref 15–500)
Eosinophils Relative: 3.3 %
HCT: 35.7 % (ref 35.0–45.0)
Hemoglobin: 11.4 g/dL — ABNORMAL LOW (ref 11.7–15.5)
Lymphs Abs: 2116 cells/uL (ref 850–3900)
MCH: 26.1 pg — ABNORMAL LOW (ref 27.0–33.0)
MCHC: 31.9 g/dL — ABNORMAL LOW (ref 32.0–36.0)
MCV: 81.9 fL (ref 80.0–100.0)
MPV: 10.2 fL (ref 7.5–12.5)
Monocytes Relative: 7.4 %
Neutro Abs: 5166 cells/uL (ref 1500–7800)
Neutrophils Relative %: 63 %
Platelets: 339 10*3/uL (ref 140–400)
RBC: 4.36 10*6/uL (ref 3.80–5.10)
RDW: 16.2 % — ABNORMAL HIGH (ref 11.0–15.0)
Total Lymphocyte: 25.8 %
WBC: 8.2 10*3/uL (ref 3.8–10.8)

## 2020-01-20 LAB — COMPLETE METABOLIC PANEL WITH GFR
AG Ratio: 1.2 (calc) (ref 1.0–2.5)
ALT: 32 U/L — ABNORMAL HIGH (ref 6–29)
AST: 27 U/L (ref 10–30)
Albumin: 3.6 g/dL (ref 3.6–5.1)
Alkaline phosphatase (APISO): 67 U/L (ref 31–125)
BUN: 12 mg/dL (ref 7–25)
CO2: 28 mmol/L (ref 20–32)
Calcium: 8.7 mg/dL (ref 8.6–10.2)
Chloride: 104 mmol/L (ref 98–110)
Creat: 0.82 mg/dL (ref 0.50–1.10)
GFR, Est African American: 107 mL/min/{1.73_m2} (ref >=60)
GFR, Est Non African American: 93 mL/min/{1.73_m2} (ref >=60)
Globulin: 3 g/dL (calc) (ref 1.9–3.7)
Glucose, Bld: 92 mg/dL (ref 65–99)
Potassium: 4.1 mmol/L (ref 3.5–5.3)
Sodium: 139 mmol/L (ref 135–146)
Total Bilirubin: 0.3 mg/dL (ref 0.2–1.2)
Total Protein: 6.6 g/dL (ref 6.1–8.1)

## 2020-01-20 LAB — LIPID PANEL
Cholesterol: 227 mg/dL — ABNORMAL HIGH (ref <200)
HDL: 66 mg/dL (ref >=50)
LDL Cholesterol (Calc): 135 mg/dL (calc) — ABNORMAL HIGH
Non-HDL Cholesterol (Calc): 161 mg/dL (calc) — ABNORMAL HIGH (ref <130)
Total CHOL/HDL Ratio: 3.4 (calc) (ref <5.0)
Triglycerides: 139 mg/dL (ref <150)

## 2020-01-20 NOTE — Progress Notes (Signed)
Lab results: she is mildly anemic. We will monitor.  Her cholesterol levels are elevated. We will recheck however she should follow low cholesterol diet, increasing exercise may reduce, and she may take OTC omega 3 1000 daily.   I will see her in 6 months for lab repeat as planned.

## 2020-01-21 NOTE — Progress Notes (Signed)
Thanks just keep trying.

## 2020-01-22 ENCOUNTER — Other Ambulatory Visit: Payer: Self-pay

## 2020-01-22 ENCOUNTER — Ambulatory Visit (INDEPENDENT_AMBULATORY_CARE_PROVIDER_SITE_OTHER): Payer: No Typology Code available for payment source | Admitting: Nurse Practitioner

## 2020-01-22 ENCOUNTER — Encounter: Payer: Self-pay | Admitting: Nurse Practitioner

## 2020-01-22 VITALS — BP 126/72 | HR 90 | Temp 98.1°F | Resp 16 | Ht 67.5 in | Wt 368.0 lb

## 2020-01-22 DIAGNOSIS — D500 Iron deficiency anemia secondary to blood loss (chronic): Secondary | ICD-10-CM | POA: Diagnosis not present

## 2020-01-22 DIAGNOSIS — L03319 Cellulitis of trunk, unspecified: Secondary | ICD-10-CM

## 2020-01-22 DIAGNOSIS — Z09 Encounter for follow-up examination after completed treatment for conditions other than malignant neoplasm: Secondary | ICD-10-CM | POA: Diagnosis not present

## 2020-01-22 DIAGNOSIS — L02219 Cutaneous abscess of trunk, unspecified: Secondary | ICD-10-CM

## 2020-01-22 DIAGNOSIS — E7800 Pure hypercholesterolemia, unspecified: Secondary | ICD-10-CM

## 2020-01-22 MED ORDER — OXYCODONE HCL 5 MG PO TABS: 5.0000 mg | ORAL_TABLET | ORAL | 0 refills | Status: AC | PRN

## 2020-01-22 NOTE — Addendum Note (Signed)
Addended by: Lawson Fiscal on: 01/22/2020 04:00 PM   Modules accepted: Orders

## 2020-01-22 NOTE — Progress Notes (Signed)
Acute Office Visit  Subjective:    Patient ID: Lacey Whitaker, female    DOB: 1986/03/20, 34 y.o.   MRN: [id number]  Chief complaint: follow up appointment  HPI Patient is in today for a follow up appointment for an abscess to which was treated by this clinic starting on 01/19/2020 with doxycycline. On the last visit she had completed an Urgent care visit for area just under breast that was enlarged and painful to which they instructed her to take tylenol, use steroid cream, and follow up with a primary care provider. On the day of visit 01/19/2020 in this clinic she reported wakening with the lesion opening and was draining. She did not recall color, no odor, no fever/chills, increased temperature and very painful. She denied know injury that treatment was initiated Today the areas opening has increased with increased drainage. No foul odor, fever/chills, with increased pain. Past Medical History:  Diagnosis Date  . Asthma   . Cyst, mediastinum    pt. reports having the cyst removed last year and has reoccured  . Iron deficiency anemia due to chronic blood loss   . Morbid obesity (HCC)    s/p gastric bypass surgery in 2009  . Pregnancy induced hypertension    2006    Past Surgical History:  Procedure Laterality Date  . BONE CYST EXCISION  2010  . CESAREAN SECTION    . CESAREAN SECTION  09/02/2011   Procedure: CESAREAN SECTION;  Surgeon: Purcell Nails, MD;  Location: [facility];  Service: Gynecology;  Laterality: N/A;  . GASTRIC BYPASS  05/2008  . GASTRIC BYPASS  2009  . WISDOM TOOTH EXTRACTION  09/2010    Family History  Problem Relation Age of Onset  . Asthma Mother   . Asthma Brother     Social History   Socioeconomic History  . Marital status: Single    Spouse name: Not on file  . Number of children: Not on file  . Years of education: Not on file  . Highest education level: Not on file  Occupational History  . Not on file  Tobacco Use  . Smoking status: Current  Some Day Smoker    Types: Cigars  . Smokeless tobacco: Never Used  . Tobacco comment: Smokes 2 Black & Milds daily  Substance and Sexual Activity  . Alcohol use: Yes  . Drug use: No  . Sexual activity: Yes    Birth control/protection: I.U.D.  Other Topics Concern  . Not on file  Social History Narrative   Works at [employer].   3 children: Ages 67,6,2 y/o    Lives with her Mom, her brother, and her 3 children.       Social Determinants of Health   Financial Resource Strain:   . Difficulty of Paying Living Expenses: Not on file  Food Insecurity:   . Worried About Programme researcher, broadcasting/film/video in the Last Year: Not on file  . Ran Out of Food in the Last Year: Not on file  Transportation Needs:   . Lack of Transportation (Medical): Not on file  . Lack of Transportation (Non-Medical): Not on file  Physical Activity:   . Days of Exercise per Week: Not on file  . Minutes of Exercise per Session: Not on file  Stress:   . Feeling of Stress : Not on file  Social Connections:   . Frequency of Communication with Friends and Family: Not on file  . Frequency of Social Gatherings with Friends  and Family: Not on file  . Attends Religious Services: Not on file  . Active Member of Clubs or Organizations: Not on file  . Attends Archivist Meetings: Not on file  . Marital Status: Not on file  Intimate Partner Violence:   . Fear of Current or Ex-Partner: Not on file  . Emotionally Abused: Not on file  . Physically Abused: Not on file  . Sexually Abused: Not on file    Outpatient Medications Prior to Visit  Medication Sig Dispense Refill  . albuterol (PROVENTIL HFA) 108 (90 Base) MCG/ACT inhaler Inhale 2 puffs into the lungs every 6 (six) hours as needed for wheezing or shortness of breath.    . diclofenac Sodium (VOLTAREN) 1 % GEL Apply 4 g topically 4 (four) times daily. 100 g 0  . doxycycline (VIBRA-TABS) 100 MG tablet Take 1 tablet (100 mg total) by mouth 2 (two) times daily.  20 tablet 0  . furosemide (LASIX) 20 MG tablet Take 1 tablet (20 mg total) by mouth daily. 5 tablet 0  . levonorgestrel (MIRENA) 20 MCG/24HR IUD 1 each by Intrauterine route once.    Marland Kitchen lisinopril-hydrochlorothiazide (ZESTORETIC) 20-12.5 MG tablet Take 2 tablets by mouth daily. 60 tablet 0  . oxyCODONE (ROXICODONE) 5 MG immediate release tablet Take 1 tablet (5 mg total) by mouth every 4 (four) hours as needed for up to 3 days for severe pain. 18 tablet 0  . tiZANidine (ZANAFLEX) 2 MG tablet Take 2 mg by mouth every 6 (six) hours as needed for muscle spasms.    . Vitamin D, Ergocalciferol, (DRISDOL) 1.25 MG (50000 UT) CAPS capsule Take 50,000 Units by mouth every Monday.     No facility-administered medications prior to visit.    No Known Allergies  Review of Systems  All other systems reviewed and are negative.      Objective:    Physical Exam Vitals and nursing note reviewed.  Constitutional:      Appearance: Normal appearance. She is well-developed, well-groomed and overweight.  HENT:     Head: Normocephalic.     Right Ear: Hearing and external ear normal.     Left Ear: Hearing and external ear normal.     Nose: No nasal deformity.     Mouth/Throat:     Lips: Pink.  Eyes:     General: Lids are normal. Lids are everted, no foreign bodies appreciated.     Extraocular Movements:     Right eye: Normal extraocular motion.     Left eye: Normal extraocular motion.  Cardiovascular:     Rate and Rhythm: Normal rate.  Pulmonary:     Effort: Pulmonary effort is normal.  Musculoskeletal:        General: Normal range of motion.     Cervical back: Normal range of motion and neck supple.     Right lower leg: No edema.     Left lower leg: No edema.  Lymphadenopathy:     Cervical: No cervical adenopathy.     Upper Body:     Right upper body: No axillary adenopathy.  Skin:    General: Skin is warm and dry.     Capillary Refill: Capillary refill takes less than 2 seconds.      Findings: Abscess present.          Comments: Increased opening, can visualize cystic sac with purulent curdy like serous drainage.  Neurological:     General: No focal deficit present.  Mental Status: She is alert and oriented to person, place, and time.  Psychiatric:        Behavior: Behavior is cooperative.     BP 126/72   Pulse 90   Temp 98.1 F (36.7 C) (Temporal)   Resp 16   Ht 5' 7.5" (1.715 m)   Wt (!) 368 lb (166.9 kg)   SpO2 97%   BMI 56.79 kg/m  Wt Readings from Last 3 Encounters:  01/22/20 (!) 368 lb (166.9 kg)  01/19/20 (!) 373 lb 6.4 oz (169.4 kg)  10/29/17 (!) 347 lb (157.4 kg)    Health Maintenance Due  Topic Date Due  . HIV Screening  07/04/1999  . PAP SMEAR-Modifier  07/03/2005    There are no preventive care reminders to display for this patient.   No results found for: TSH Lab Results  Component Value Date   WBC 8.2 01/19/2020   HGB 11.4 (L) 01/19/2020   HCT 35.7 01/19/2020   MCV 81.9 01/19/2020   PLT 339 01/19/2020   Lab Results  Component Value Date   NA 139 01/19/2020   K 4.1 01/19/2020   CO2 28 01/19/2020   GLUCOSE 92 01/19/2020   BUN 12 01/19/2020   CREATININE 0.82 01/19/2020   BILITOT 0.3 01/19/2020   ALKPHOS 77 06/25/2019   AST 27 01/19/2020   ALT 32 (H) 01/19/2020   PROT 6.6 01/19/2020   ALBUMIN 3.1 (L) 06/25/2019   CALCIUM 8.7 01/19/2020   ANIONGAP 12 06/25/2019   Lab Results  Component Value Date   CHOL 227 (H) 01/19/2020   Lab Results  Component Value Date   HDL 66 01/19/2020   Lab Results  Component Value Date   LDLCALC 135 (H) 01/19/2020   Lab Results  Component Value Date   TRIG 139 01/19/2020   Lab Results  Component Value Date   CHOLHDL 3.4 01/19/2020   No results found for: HGBA1C     Assessment & Plan:   Problem List Items Addressed This Visit      Other   Obesities, morbid (HCC) (Chronic)   Iron deficiency anemia due to chronic blood loss    Other Visit Diagnoses    Follow-up exam  after treatment    -  Primary   Hypercholesteremia       Cellulitis and abscess of trunk        1. Incision and drainage, with idoform packing with dressing.  2. Change dressing at home once daily with warm antibacterial soaks applying new dressing 3. Seek medical attention if over the weekend sxs become worse 4. Provided verbal and written instructions for anemia, dyslipidemia, fat and cholesterol restricted diet, skin abscess, and incision and drainage of abscess after care. 5. Complete A1C 6. Make apt for Monday follow up Follow Up: on Monday.   Elmore Guise, FNP

## 2020-01-25 LAB — WOUND CULTURE
MICRO NUMBER:: 10215273
RESULT:: NO GROWTH
SPECIMEN QUALITY:: ADEQUATE

## 2020-01-26 ENCOUNTER — Other Ambulatory Visit: Payer: Self-pay

## 2020-01-26 ENCOUNTER — Ambulatory Visit (INDEPENDENT_AMBULATORY_CARE_PROVIDER_SITE_OTHER): Payer: No Typology Code available for payment source | Admitting: Nurse Practitioner

## 2020-01-26 VITALS — BP 130/78 | HR 88 | Temp 98.5°F | Resp 18 | Ht 67.5 in | Wt 372.4 lb

## 2020-01-26 DIAGNOSIS — Z09 Encounter for follow-up examination after completed treatment for conditions other than malignant neoplasm: Secondary | ICD-10-CM

## 2020-01-26 DIAGNOSIS — L02219 Cutaneous abscess of trunk, unspecified: Secondary | ICD-10-CM | POA: Diagnosis not present

## 2020-01-26 DIAGNOSIS — L03319 Cellulitis of trunk, unspecified: Secondary | ICD-10-CM | POA: Diagnosis not present

## 2020-01-26 DIAGNOSIS — R739 Hyperglycemia, unspecified: Secondary | ICD-10-CM | POA: Diagnosis not present

## 2020-01-26 MED ORDER — SULFAMETHOXAZOLE-TRIMETHOPRIM 800-160 MG PO TABS: 1.0000 | ORAL_TABLET | Freq: Two times a day (BID) | ORAL | 0 refills | Status: AC

## 2020-01-26 NOTE — Progress Notes (Signed)
Acute Office Visit  Subjective:    Patient ID: Lacey Whitaker, female    DOB: 01-03-1986, 34 y.o.   MRN: [id number]  Chief Complaint  Patient presents with  . Follow-up    HPI Patient is in today for follow up. She was initially seen related to establishing care and c/o her feeling an enlarged lump to upper right abdomen just under her right breast. She had been seen for the same area a few days prior by an urgent care that treated her with steroid cream.  A few days later she noticed a "head" and while showering it burst. The most recent visit to this clinic, the area had opened with drainage. I and D completed under sterile technique. Much was expressed and was sent for culture. She returns today feeling less pain, swelling, and discharge. She reports that she has soaked the area with warm soaks saturated in antibacterial liquid soap. No fever/chills, foul odor, or d/c.  Past Medical History:  Diagnosis Date  . Asthma   . Cyst, mediastinum    pt. reports having the cyst removed last year and has reoccured  . Iron deficiency anemia due to chronic blood loss   . Morbid obesity (HCC)    s/p gastric bypass surgery in 2009  . Pregnancy induced hypertension    2006    Past Surgical History:  Procedure Laterality Date  . BONE CYST EXCISION  2010  . CESAREAN SECTION    . CESAREAN SECTION  09/02/2011   Procedure: CESAREAN SECTION;  Surgeon: Purcell Nails, MD;  Location: [facility];  Service: Gynecology;  Laterality: N/A;  . GASTRIC BYPASS  05/2008  . GASTRIC BYPASS  2009  . WISDOM TOOTH EXTRACTION  09/2010    Family History  Problem Relation Age of Onset  . Asthma Mother   . Asthma Brother     Social History   Socioeconomic History  . Marital status: Single    Spouse name: Not on file  . Number of children: Not on file  . Years of education: Not on file  . Highest education level: Not on file  Occupational History  . Not on file  Tobacco Use  . Smoking status: Current  Some Day Smoker    Types: Cigars  . Smokeless tobacco: Never Used  . Tobacco comment: Smokes 2 Black & Milds daily  Substance and Sexual Activity  . Alcohol use: Yes  . Drug use: No  . Sexual activity: Yes    Birth control/protection: I.U.D.  Other Topics Concern  . Not on file  Social History Narrative   Works at [employer].   3 children: Ages 25,6,2 y/o    Lives with her Mom, her brother, and her 3 children.       Social Determinants of Health   Financial Resource Strain:   . Difficulty of Paying Living Expenses: Not on file  Food Insecurity:   . Worried About Programme researcher, broadcasting/film/video in the Last Year: Not on file  . Ran Out of Food in the Last Year: Not on file  Transportation Needs:   . Lack of Transportation (Medical): Not on file  . Lack of Transportation (Non-Medical): Not on file  Physical Activity:   . Days of Exercise per Week: Not on file  . Minutes of Exercise per Session: Not on file  Stress:   . Feeling of Stress : Not on file  Social Connections:   . Frequency of Communication with Friends and  Family: Not on file  . Frequency of Social Gatherings with Friends and Family: Not on file  . Attends Religious Services: Not on file  . Active Member of Clubs or Organizations: Not on file  . Attends Archivist Meetings: Not on file  . Marital Status: Not on file  Intimate Partner Violence:   . Fear of Current or Ex-Partner: Not on file  . Emotionally Abused: Not on file  . Physically Abused: Not on file  . Sexually Abused: Not on file    Outpatient Medications Prior to Visit  Medication Sig Dispense Refill  . albuterol (PROVENTIL HFA) 108 (90 Base) MCG/ACT inhaler Inhale 2 puffs into the lungs every 6 (six) hours as needed for wheezing or shortness of breath.    . diclofenac Sodium (VOLTAREN) 1 % GEL Apply 4 g topically 4 (four) times daily. 100 g 0  . doxycycline (VIBRA-TABS) 100 MG tablet Take 1 tablet (100 mg total) by mouth 2 (two) times daily.  20 tablet 0  . furosemide (LASIX) 20 MG tablet Take 1 tablet (20 mg total) by mouth daily. 5 tablet 0  . levonorgestrel (MIRENA) 20 MCG/24HR IUD 1 each by Intrauterine route once.    Marland Kitchen lisinopril-hydrochlorothiazide (ZESTORETIC) 20-12.5 MG tablet Take 2 tablets by mouth daily. 60 tablet 0  . tiZANidine (ZANAFLEX) 2 MG tablet Take 2 mg by mouth every 6 (six) hours as needed for muscle spasms.    . Vitamin D, Ergocalciferol, (DRISDOL) 1.25 MG (50000 UT) CAPS capsule Take 50,000 Units by mouth every Monday.     No facility-administered medications prior to visit.    No Known Allergies  Review of Systems  All other systems reviewed and are negative.      Objective:    Physical Exam Vitals and nursing note reviewed.  Constitutional:      General: She is not in acute distress.    Appearance: Normal appearance. She is not ill-appearing or toxic-appearing.  HENT:     Head: Normocephalic.  Cardiovascular:     Rate and Rhythm: Normal rate.     Pulses: Normal pulses.  Pulmonary:     Effort: Pulmonary effort is normal.  Musculoskeletal:     Cervical back: Normal range of motion and neck supple.  Skin:    General: Skin is warm and dry.     Capillary Refill: Capillary refill takes less than 2 seconds.          Comments: Affected region to right upper abdomen under right breast. Today with decreased erythema, no edema, wound edges are closing, opened inside tissue healing well, no drainage, no foul odor. 1/2 packing required this follow up visit.   Neurological:     Mental Status: She is alert and oriented to person, place, and time.  Psychiatric:        Attention and Perception: Attention and perception normal.        Mood and Affect: Mood and affect normal.        Speech: Speech normal.        Behavior: Behavior normal.        Cognition and Memory: Cognition normal.     BP 130/78 (BP Location: Left Arm, Patient Position: Sitting, Cuff Size: Large)   Pulse 88   Temp 98.5 F  (36.9 C) (Oral)   Resp 18   Ht 5' 7.5" (1.715 m)   Wt (!) 372 lb 6.4 oz (168.9 kg)   SpO2 98%   BMI 57.47 kg/m  Wt  Readings from Last 3 Encounters:  01/26/20 (!) 372 lb 6.4 oz (168.9 kg)  01/22/20 (!) 368 lb (166.9 kg)  01/19/20 (!) 373 lb 6.4 oz (169.4 kg)    Health Maintenance Due  Topic Date Due  . HIV Screening  07/04/1999  . PAP SMEAR-Modifier  07/03/2005    There are no preventive care reminders to display for this patient.   No results found for: TSH Lab Results  Component Value Date   WBC 8.2 01/19/2020   HGB 11.4 (L) 01/19/2020   HCT 35.7 01/19/2020   MCV 81.9 01/19/2020   PLT 339 01/19/2020   Lab Results  Component Value Date   NA 139 01/19/2020   K 4.1 01/19/2020   CO2 28 01/19/2020   GLUCOSE 92 01/19/2020   BUN 12 01/19/2020   CREATININE 0.82 01/19/2020   BILITOT 0.3 01/19/2020   ALKPHOS 77 06/25/2019   AST 27 01/19/2020   ALT 32 (H) 01/19/2020   PROT 6.6 01/19/2020   ALBUMIN 3.1 (L) 06/25/2019   CALCIUM 8.7 01/19/2020   ANIONGAP 12 06/25/2019   Lab Results  Component Value Date   CHOL 227 (H) 01/19/2020   Lab Results  Component Value Date   HDL 66 01/19/2020   Lab Results  Component Value Date   LDLCALC 135 (H) 01/19/2020   Lab Results  Component Value Date   TRIG 139 01/19/2020   Lab Results  Component Value Date   CHOLHDL 3.4 01/19/2020   No results found for: HGBA1C     Assessment & Plan:   Healing wound, follow up daily with one dressing change at home as stated leaving packing for next day office clinic.   Problem List Items Addressed This Visit    None    Visit Diagnoses    Follow-up exam after treatment    -  Primary   Cellulitis and abscess of trunk       Relevant Medications   sulfamethoxazole-trimethoprim (BACTRIM DS) 800-160 MG tablet   Blood glucose elevated       Relevant Orders   Hemoglobin A1c   CBC with Differential/Platelet       Meds ordered this encounter  Medications  .  sulfamethoxazole-trimethoprim (BACTRIM DS) 800-160 MG tablet    Sig: Take 1 tablet by mouth 2 (two) times daily for 7 days.    Dispense:  14 tablet    Refill:  0    Follow Up: in 1 day  Elmore Guise, FNP

## 2020-01-27 ENCOUNTER — Ambulatory Visit (INDEPENDENT_AMBULATORY_CARE_PROVIDER_SITE_OTHER): Payer: No Typology Code available for payment source | Admitting: Nurse Practitioner

## 2020-01-27 ENCOUNTER — Other Ambulatory Visit: Payer: Self-pay | Admitting: Nurse Practitioner

## 2020-01-27 ENCOUNTER — Encounter: Payer: Self-pay | Admitting: Nurse Practitioner

## 2020-01-27 VITALS — BP 144/82 | HR 87 | Temp 98.5°F | Wt 372.4 lb

## 2020-01-27 DIAGNOSIS — Z09 Encounter for follow-up examination after completed treatment for conditions other than malignant neoplasm: Secondary | ICD-10-CM | POA: Diagnosis not present

## 2020-01-27 DIAGNOSIS — E118 Type 2 diabetes mellitus with unspecified complications: Secondary | ICD-10-CM | POA: Diagnosis not present

## 2020-01-27 DIAGNOSIS — L03319 Cellulitis of trunk, unspecified: Secondary | ICD-10-CM | POA: Diagnosis not present

## 2020-01-27 DIAGNOSIS — E11628 Type 2 diabetes mellitus with other skin complications: Secondary | ICD-10-CM

## 2020-01-27 DIAGNOSIS — E119 Type 2 diabetes mellitus without complications: Secondary | ICD-10-CM

## 2020-01-27 DIAGNOSIS — L02219 Cutaneous abscess of trunk, unspecified: Secondary | ICD-10-CM

## 2020-01-27 DIAGNOSIS — I10 Essential (primary) hypertension: Secondary | ICD-10-CM

## 2020-01-27 HISTORY — DX: Type 2 diabetes mellitus without complications: E11.9

## 2020-01-27 LAB — CBC WITH DIFFERENTIAL/PLATELET
Absolute Monocytes: 472 cells/uL (ref 200–950)
Basophils Absolute: 48 cells/uL (ref 0–200)
Basophils Relative: 0.6 %
Eosinophils Absolute: 248 cells/uL (ref 15–500)
Eosinophils Relative: 3.1 %
HCT: 35.8 % (ref 35.0–45.0)
Hemoglobin: 11.3 g/dL — ABNORMAL LOW (ref 11.7–15.5)
Lymphs Abs: 2368 cells/uL (ref 850–3900)
MCH: 25.6 pg — ABNORMAL LOW (ref 27.0–33.0)
MCHC: 31.6 g/dL — ABNORMAL LOW (ref 32.0–36.0)
MCV: 81.2 fL (ref 80.0–100.0)
MPV: 9.6 fL (ref 7.5–12.5)
Monocytes Relative: 5.9 %
Neutro Abs: 4864 cells/uL (ref 1500–7800)
Neutrophils Relative %: 60.8 %
Platelets: 386 10*3/uL (ref 140–400)
RBC: 4.41 10*6/uL (ref 3.80–5.10)
RDW: 16 % — ABNORMAL HIGH (ref 11.0–15.0)
Total Lymphocyte: 29.6 %
WBC: 8 10*3/uL (ref 3.8–10.8)

## 2020-01-27 LAB — HEMOGLOBIN A1C
Hgb A1c MFr Bld: 5.9 % of total Hgb — ABNORMAL HIGH (ref <5.7)
Mean Plasma Glucose: 123 (calc)
eAG (mmol/L): 6.8 (calc)

## 2020-01-27 NOTE — Progress Notes (Signed)
Pt coming in today for dressing change and wound eval. I will let her know her lab result.

## 2020-01-27 NOTE — Progress Notes (Signed)
Acute Office Visit  Subjective:    Patient ID: Lacey Whitaker, female    DOB: June 22, 1986, 34 y.o.   MRN: [id number]  Chief complaint: follow up   HPI Patient is a 34 year old African American female in today for follow up for evaluation of healing of drained abscess to right upper chest just under breast. She was last seen in clinic yesterday with dressing change and evaluation. The area had greatly improved. No fever/chills, foul odor, or discharge. She reports feeling significant less pain, reduced swelling. Cultures reviewed. Bactrim initiated yesterday. Pt confirmed that she is completing one warm compress with liquid antibacterial soap leaving packing in place returning daily this week as planned.    Past Medical History:  Diagnosis Date  . Asthma   . Cyst, mediastinum    pt. reports having the cyst removed last year and has reoccured  . Iron deficiency anemia due to chronic blood loss   . Morbid obesity (HCC)    s/p gastric bypass surgery in 2009  . Pregnancy induced hypertension    2006    Past Surgical History:  Procedure Laterality Date  . BONE CYST EXCISION  2010  . CESAREAN SECTION    . CESAREAN SECTION  09/02/2011   Procedure: CESAREAN SECTION;  Surgeon: Purcell Nails, MD;  Location: [facility];  Service: Gynecology;  Laterality: N/A;  . GASTRIC BYPASS  05/2008  . GASTRIC BYPASS  2009  . WISDOM TOOTH EXTRACTION  09/2010    Family History  Problem Relation Age of Onset  . Asthma Mother   . Asthma Brother     Social History   Socioeconomic History  . Marital status: Single    Spouse name: Not on file  . Number of children: Not on file  . Years of education: Not on file  . Highest education level: Not on file  Occupational History  . Not on file  Tobacco Use  . Smoking status: Current Some Day Smoker    Types: Cigars  . Smokeless tobacco: Never Used  . Tobacco comment: Smokes 2 Black & Milds daily  Substance and Sexual Activity  . Alcohol use: Yes    . Drug use: No  . Sexual activity: Yes    Birth control/protection: I.U.D.  Other Topics Concern  . Not on file  Social History Narrative   Works at [employer].   3 children: Ages 61,6,2 y/o    Lives with her Mom, her brother, and her 3 children.       Social Determinants of Health   Financial Resource Strain:   . Difficulty of Paying Living Expenses: Not on file  Food Insecurity:   . Worried About Programme researcher, broadcasting/film/video in the Last Year: Not on file  . Ran Out of Food in the Last Year: Not on file  Transportation Needs:   . Lack of Transportation (Medical): Not on file  . Lack of Transportation (Non-Medical): Not on file  Physical Activity:   . Days of Exercise per Week: Not on file  . Minutes of Exercise per Session: Not on file  Stress:   . Feeling of Stress : Not on file  Social Connections:   . Frequency of Communication with Friends and Family: Not on file  . Frequency of Social Gatherings with Friends and Family: Not on file  . Attends Religious Services: Not on file  . Active Member of Clubs or Organizations: Not on file  . Attends Banker  Meetings: Not on file  . Marital Status: Not on file  Intimate Partner Violence:   . Fear of Current or Ex-Partner: Not on file  . Emotionally Abused: Not on file  . Physically Abused: Not on file  . Sexually Abused: Not on file    Outpatient Medications Prior to Visit  Medication Sig Dispense Refill  . albuterol (PROVENTIL HFA) 108 (90 Base) MCG/ACT inhaler Inhale 2 puffs into the lungs every 6 (six) hours as needed for wheezing or shortness of breath.    . diclofenac Sodium (VOLTAREN) 1 % GEL Apply 4 g topically 4 (four) times daily. 100 g 0  . furosemide (LASIX) 20 MG tablet Take 1 tablet (20 mg total) by mouth daily. 5 tablet 0  . levonorgestrel (MIRENA) 20 MCG/24HR IUD 1 each by Intrauterine route once.    . sulfamethoxazole-trimethoprim (BACTRIM DS) 800-160 MG tablet Take 1 tablet by mouth 2 (two) times  daily for 7 days. 14 tablet 0  . tiZANidine (ZANAFLEX) 2 MG tablet Take 2 mg by mouth every 6 (six) hours as needed for muscle spasms.    . Vitamin D, Ergocalciferol, (DRISDOL) 1.25 MG (50000 UT) CAPS capsule Take 50,000 Units by mouth every Monday.    Marland Kitchen lisinopril-hydrochlorothiazide (ZESTORETIC) 20-12.5 MG tablet Take 2 tablets by mouth daily. 60 tablet 0   No facility-administered medications prior to visit.    No Known Allergies  Review of Systems  All other systems reviewed and are negative.      Objective:    Physical Exam Vitals and nursing note reviewed.  Constitutional:      Appearance: Normal appearance.  HENT:     Head: Normocephalic.  Eyes:     Extraocular Movements: Extraocular movements intact.     Conjunctiva/sclera: Conjunctivae normal.  Cardiovascular:     Rate and Rhythm: Normal rate.  Pulmonary:     Effort: Pulmonary effort is normal.  Skin:    General: Skin is warm and dry.     Findings: Wound present. No abscess or erythema.          Comments: Right upper abdomen under right breast. Edges well approximated, healthy tissue, tissue closure from inside . No foul odor, no drainage.   Wound cleaned and repacked, redressed.  Neurological:     General: No focal deficit present.     Mental Status: She is alert and oriented to person, place, and time.     BP (!) 144/82   Pulse 87   Temp 98.5 F (36.9 C) (Oral)   Wt (!) 372 lb 6 oz (168.9 kg)   SpO2 94%   BMI 57.46 kg/m  Wt Readings from Last 3 Encounters:  01/27/20 (!) 372 lb 6 oz (168.9 kg)  01/26/20 (!) 372 lb 6.4 oz (168.9 kg)  01/22/20 (!) 368 lb (166.9 kg)    Health Maintenance Due  Topic Date Due  . URINE MICROALBUMIN  07/03/1994  . HIV Screening  07/04/1999  . PAP SMEAR-Modifier  07/03/2005    Lab Results  Component Value Date   WBC 8.0 01/26/2020   HGB 11.3 (L) 01/26/2020   HCT 35.8 01/26/2020   MCV 81.2 01/26/2020   PLT 386 01/26/2020   Lab Results  Component Value Date    NA 139 01/19/2020   K 4.1 01/19/2020   CO2 28 01/19/2020   GLUCOSE 92 01/19/2020   BUN 12 01/19/2020   CREATININE 0.82 01/19/2020   BILITOT 0.3 01/19/2020   ALKPHOS 77 06/25/2019   AST 27  01/19/2020   ALT 32 (H) 01/19/2020   PROT 6.6 01/19/2020   ALBUMIN 3.1 (L) 06/25/2019   CALCIUM 8.7 01/19/2020   ANIONGAP 12 06/25/2019   Lab Results  Component Value Date   CHOL 227 (H) 01/19/2020   Lab Results  Component Value Date   HDL 66 01/19/2020   Lab Results  Component Value Date   LDLCALC 135 (H) 01/19/2020   Lab Results  Component Value Date   TRIG 139 01/19/2020   Lab Results  Component Value Date   CHOLHDL 3.4 01/19/2020   Lab Results  Component Value Date   HGBA1C 5.9 (H) 01/26/2020      Assessment & Plan:  DM II discussed, lifestyle changes discussed, educational printout provided Wound cleaned and repacked, redressed with continued healing and improvement Continue treatment plan as instructed Follow up tomorrow.   Problem List Items Addressed This Visit    None    Visit Diagnoses    Follow-up exam    -  Primary   Relevant Orders   Remove dressing   Change dressing   Cellulitis and abscess of trunk       Type 2 diabetes mellitus with other specified complication, unspecified whether long term insulin use (HCC)         Follow up: tomorrow  Elmore Guise, FNP

## 2020-01-28 ENCOUNTER — Ambulatory Visit (INDEPENDENT_AMBULATORY_CARE_PROVIDER_SITE_OTHER): Payer: No Typology Code available for payment source | Admitting: Nurse Practitioner

## 2020-01-28 DIAGNOSIS — L03319 Cellulitis of trunk, unspecified: Secondary | ICD-10-CM

## 2020-01-28 DIAGNOSIS — L02219 Cutaneous abscess of trunk, unspecified: Secondary | ICD-10-CM

## 2020-01-28 DIAGNOSIS — Z5329 Procedure and treatment not carried out because of patient's decision for other reasons: Secondary | ICD-10-CM

## 2020-01-28 NOTE — Progress Notes (Signed)
error 

## 2020-01-29 ENCOUNTER — Other Ambulatory Visit: Payer: Self-pay

## 2020-01-29 ENCOUNTER — Other Ambulatory Visit: Payer: Self-pay | Admitting: Nurse Practitioner

## 2020-01-29 ENCOUNTER — Ambulatory Visit (INDEPENDENT_AMBULATORY_CARE_PROVIDER_SITE_OTHER): Payer: No Typology Code available for payment source | Admitting: Nurse Practitioner

## 2020-01-29 VITALS — BP 120/72 | HR 90 | Temp 98.1°F | Resp 18 | Ht 67.5 in | Wt 372.6 lb

## 2020-01-29 DIAGNOSIS — Z09 Encounter for follow-up examination after completed treatment for conditions other than malignant neoplasm: Secondary | ICD-10-CM

## 2020-01-29 DIAGNOSIS — Z4800 Encounter for change or removal of nonsurgical wound dressing: Secondary | ICD-10-CM | POA: Diagnosis not present

## 2020-01-29 NOTE — Progress Notes (Signed)
Acute Office Visit  Subjective:    Patient ID: Lacey Whitaker, female    DOB: [birth year], 34 y.o.   MRN: [id number]  Chief Complaint: follow up  HPI Patient isa 34 year old African American femalein today forfollow upfor evaluation of healing of drained abscessto right upper chest just under breast.She was last seen in clinic 2 days ago, missing appointment yesterday. The affected area had greatly improved. No fever/chills, foul odor,or discharge. No pain, redness, edema. Pt confirmed that she is completing one warm compress with liquid antibacterial soap leaving packing in place returning daily. Today she was instructed on wet to dry bid dressing changes and a few supplies provided until she can purchase from pharmacy. She feels comfortable with directions. She will follow up for s/s/ of infection and 1 week for follow up examination.   Past Medical History:  Diagnosis Date  . Asthma   . Cyst, mediastinum    pt. reports having the cyst removed last year and has reoccured  . Diabetes mellitus type II, controlled (HCC) 01/27/2020  . Iron deficiency anemia due to chronic blood loss   . Morbid obesity (HCC)    s/p gastric bypass surgery in 2009  . Pregnancy induced hypertension    2006    Past Surgical History:  Procedure Laterality Date  . BONE CYST EXCISION  2010  . CESAREAN SECTION    . CESAREAN SECTION  09/02/2011   Procedure: CESAREAN SECTION;  Surgeon: Purcell Nails, MD;  Location: [facility];  Service: Gynecology;  Laterality: N/A;  . GASTRIC BYPASS  05/2008  . GASTRIC BYPASS  2009  . WISDOM TOOTH EXTRACTION  09/2010    Family History  Problem Relation Age of Onset  . Asthma Mother   . Asthma Brother     Social History   Socioeconomic History  . Marital status: Single    Spouse name: Not on file  . Number of children: Not on file  . Years of education: Not on file  . Highest education level: Not on file  Occupational History  . Not on file  Tobacco Use  .  Smoking status: Current Some Day Smoker    Types: Cigars  . Smokeless tobacco: Never Used  . Tobacco comment: Smokes 2 Black & Milds daily  Substance and Sexual Activity  . Alcohol use: Yes  . Drug use: No  . Sexual activity: Yes    Birth control/protection: I.U.D.  Other Topics Concern  . Not on file  Social History Narrative   Works at [employer].   3 children: Ages 78,6,2 y/o    Lives with her Mom, her brother, and her 3 children.       Social Determinants of Health   Financial Resource Strain:   . Difficulty of Paying Living Expenses:   Food Insecurity:   . Worried About Programme researcher, broadcasting/film/video in the Last Year:   . Barista in the Last Year:   Transportation Needs:   . Freight forwarder (Medical):   Marland Kitchen Lack of Transportation (Non-Medical):   Physical Activity:   . Days of Exercise per Week:   . Minutes of Exercise per Session:   Stress:   . Feeling of Stress :   Social Connections:   . Frequency of Communication with Friends and Family:   . Frequency of Social Gatherings with Friends and Family:   . Attends Religious Services:   . Active Member of Clubs or Organizations:   .  Attends Archivist Meetings:   Marland Kitchen Marital Status:   Intimate Partner Violence:   . Fear of Current or Ex-Partner:   . Emotionally Abused:   Marland Kitchen Physically Abused:   . Sexually Abused:     No Known Allergies  Review of Systems  All other systems reviewed and are negative.     Objective:    Physical Exam   Vitals and nursing note reviewed.  Constitutional:      Appearance: Normal appearance.  HENT:     Head: Normocephalic.  Eyes:     Extraocular Movements: Extraocular movements intact.     Conjunctiva/sclera: Conjunctivae normal.  Cardiovascular:     Rate and Rhythm: Normal rate.  Pulmonary:     Effort: Pulmonary effort is normal.  Skin:    General: Skin is warm and dry.     Findings: Wound present. No abscess or erythema.          Comments: Right  upper abdomen under right breast. Continued healing from inside, Edges well approximated, healthy tissue, tissue closure from inside . No foul odor, no drainage. Opening 1/2 the depth  Wound cleaned and repacked, redressed.  Neurological:     General: No focal deficit present.     Mental Status: She is alert and oriented to person, place, and time.      BP 120/72 (BP Location: Left Arm, Patient Position: Sitting, Cuff Size: Large)   Pulse 90   Temp 98.1 F (36.7 C) (Oral)   Resp 18   Ht 5' 7.5" (1.715 m)   Wt (!) 372 lb 9.6 oz (169 kg)   SpO2 98%   BMI 57.50 kg/m  Wt Readings from Last 3 Encounters:  01/29/20 (!) 372 lb 9.6 oz (169 kg)  01/27/20 (!) 372 lb 6 oz (168.9 kg)  01/26/20 (!) 372 lb 6.4 oz (168.9 kg)    Health Maintenance Due  Topic Date Due  . PNEUMOCOCCAL POLYSACCHARIDE VACCINE AGE 13-64 HIGH RISK  Never done  . FOOT EXAM  Never done  . OPHTHALMOLOGY EXAM  Never done  . URINE MICROALBUMIN  Never done  . HIV Screening  Never done  . PAP SMEAR-Modifier  Never done    There are no preventive care reminders to display for this patient.   No results found for: TSH Lab Results  Component Value Date   WBC 8.0 01/26/2020   HGB 11.3 (L) 01/26/2020   HCT 35.8 01/26/2020   MCV 81.2 01/26/2020   PLT 386 01/26/2020   Lab Results  Component Value Date   NA 139 01/19/2020   K 4.1 01/19/2020   CO2 28 01/19/2020   GLUCOSE 92 01/19/2020   BUN 12 01/19/2020   CREATININE 0.82 01/19/2020   BILITOT 0.3 01/19/2020   ALKPHOS 77 06/25/2019   AST 27 01/19/2020   ALT 32 (H) 01/19/2020   PROT 6.6 01/19/2020   ALBUMIN 3.1 (L) 06/25/2019   CALCIUM 8.7 01/19/2020   ANIONGAP 12 06/25/2019   Lab Results  Component Value Date   CHOL 227 (H) 01/19/2020   Lab Results  Component Value Date   HDL 66 01/19/2020   Lab Results  Component Value Date   LDLCALC 135 (H) 01/19/2020   Lab Results  Component Value Date   TRIG 139 01/19/2020   Lab Results  Component Value  Date   CHOLHDL 3.4 01/19/2020   Lab Results  Component Value Date   HGBA1C 5.9 (H) 01/26/2020      Assessment & Plan:  Twice a day dressing changes as office directed today. Problem List Items Addressed This Visit    None    Visit Diagnoses    Follow-up exam after treatment    -  Primary   Change or removal of wound dressing       Encounter for change of dressing         Follow up: one week and prn for worsening sxs, s/s of infection as discussed   Elmore Guise, FNP

## 2020-02-05 ENCOUNTER — Ambulatory Visit (INDEPENDENT_AMBULATORY_CARE_PROVIDER_SITE_OTHER): Payer: No Typology Code available for payment source | Admitting: Nurse Practitioner

## 2020-02-05 ENCOUNTER — Other Ambulatory Visit: Payer: Self-pay

## 2020-02-05 VITALS — BP 118/62 | HR 99 | Temp 98.7°F | Resp 18 | Ht 67.5 in | Wt 372.0 lb

## 2020-02-05 DIAGNOSIS — Z09 Encounter for follow-up examination after completed treatment for conditions other than malignant neoplasm: Secondary | ICD-10-CM

## 2020-02-05 NOTE — Progress Notes (Signed)
Acute Office Visit  Subjective:    Patient ID: Lacey Whitaker, female    DOB: 12/26/1985, 34 y.o.   MRN: [id number]  Chief Complaint  Patient presents with  . Follow-up    1 week    HPI Patient is a 34 year old A.A> female presenting for follow up after cellulitis abcess that was I/D and managed with packing to allow for closure healing. The pt is following up after one week of home self dressing as directed. Today the wound has healed except for top epidermal layer of skin without redness, drainage, foul odor, pain, or swelling. No fever/chills.   Time spent discussing diet and exercise for weight management. Pt with h/o gastric bypass then conceived and birthed a child that she lost control of weight again. She would like lifestyle changes and consider medication management to aid in weight loss at subsequent visits.   No cp/ct, gu/gi sxs, pain, sob, edema, or falls.  Past Medical History:  Diagnosis Date  . Asthma   . Cyst, mediastinum    pt. reports having the cyst removed last year and has reoccured  . Diabetes mellitus type II, controlled (HCC) 01/27/2020  . Iron deficiency anemia due to chronic blood loss   . Morbid obesity (HCC)    s/p gastric bypass surgery in 2009  . Pregnancy induced hypertension    2006    Past Surgical History:  Procedure Laterality Date  . BONE CYST EXCISION  2010  . CESAREAN SECTION    . CESAREAN SECTION  09/02/2011   Procedure: CESAREAN SECTION;  Surgeon: Purcell Nails, MD;  Location: [facility];  Service: Gynecology;  Laterality: N/A;  . GASTRIC BYPASS  05/2008  . GASTRIC BYPASS  2009  . WISDOM TOOTH EXTRACTION  09/2010    Family History  Problem Relation Age of Onset  . Asthma Mother   . Asthma Brother     Social History   Socioeconomic History  . Marital status: Single    Spouse name: Not on file  . Number of children: Not on file  . Years of education: Not on file  . Highest education level: Not on file  Occupational History   . Not on file  Tobacco Use  . Smoking status: Current Some Day Smoker    Types: Cigars  . Smokeless tobacco: Never Used  . Tobacco comment: Smokes 2 Black & Milds daily  Substance and Sexual Activity  . Alcohol use: Yes  . Drug use: No  . Sexual activity: Yes    Birth control/protection: I.U.D.  Other Topics Concern  . Not on file  Social History Narrative   Works at [employer].   3 children: Ages 63,6,2 y/o    Lives with her Mom, her brother, and her 3 children.       Social Determinants of Health   Financial Resource Strain:   . Difficulty of Paying Living Expenses:   Food Insecurity:   . Worried About Programme researcher, broadcasting/film/video in the Last Year:   . Barista in the Last Year:   Transportation Needs:   . Freight forwarder (Medical):   Marland Kitchen Lack of Transportation (Non-Medical):   Physical Activity:   . Days of Exercise per Week:   . Minutes of Exercise per Session:   Stress:   . Feeling of Stress :   Social Connections:   . Frequency of Communication with Friends and Family:   . Frequency of Social Gatherings  with Friends and Family:   . Attends Religious Services:   . Active Member of Clubs or Organizations:   . Attends Archivist Meetings:   Marland Kitchen Marital Status:   Intimate Partner Violence:   . Fear of Current or Ex-Partner:   . Emotionally Abused:   Marland Kitchen Physically Abused:   . Sexually Abused:     Outpatient Medications Prior to Visit  Medication Sig Dispense Refill  . albuterol (PROVENTIL HFA) 108 (90 Base) MCG/ACT inhaler Inhale 2 puffs into the lungs every 6 (six) hours as needed for wheezing or shortness of breath.    . diclofenac Sodium (VOLTAREN) 1 % GEL Apply 4 g topically 4 (four) times daily. 100 g 0  . furosemide (LASIX) 20 MG tablet Take 1 tablet (20 mg total) by mouth daily. 5 tablet 0  . levonorgestrel (MIRENA) 20 MCG/24HR IUD 1 each by Intrauterine route once.    Marland Kitchen tiZANidine (ZANAFLEX) 2 MG tablet Take 2 mg by mouth every 6 (six)  hours as needed for muscle spasms.    . Vitamin D, Ergocalciferol, (DRISDOL) 1.25 MG (50000 UT) CAPS capsule Take 50,000 Units by mouth every Monday.    Marland Kitchen lisinopril-hydrochlorothiazide (ZESTORETIC) 20-12.5 MG tablet Take 2 tablets by mouth daily. 60 tablet 0   No facility-administered medications prior to visit.    No Known Allergies  Review of Systems  All other systems reviewed and are negative.      Objective:    Physical Exam Constitutional:      Appearance: Normal appearance.  HENT:     Head: Normocephalic.  Eyes:     Extraocular Movements: Extraocular movements intact.     Conjunctiva/sclera: Conjunctivae normal.     Pupils: Pupils are equal, round, and reactive to light.  Cardiovascular:     Rate and Rhythm: Normal rate.  Pulmonary:     Effort: Pulmonary effort is normal.  Musculoskeletal:     Cervical back: Normal range of motion and neck supple.  Skin:         Comments: Closed/healed from inside out, remaining epidermal layer. No drainage, odor, redness, or swelling.   Neurological:     Mental Status: She is alert.     BP 118/62 (BP Location: Left Arm, Patient Position: Sitting, Cuff Size: Large)   Pulse 99   Temp 98.7 F (37.1 C) (Oral)   Resp 18   Ht 5' 7.5" (1.715 m)   Wt (!) 372 lb (168.7 kg)   SpO2 98%   BMI 57.40 kg/m  Wt Readings from Last 3 Encounters:  02/05/20 (!) 372 lb (168.7 kg)  01/29/20 (!) 372 lb 9.6 oz (169 kg)  01/27/20 (!) 372 lb 6 oz (168.9 kg)    Health Maintenance Due  Topic Date Due  . PNEUMOCOCCAL POLYSACCHARIDE VACCINE AGE 39-64 HIGH RISK  Never done  . FOOT EXAM  Never done  . OPHTHALMOLOGY EXAM  Never done  . URINE MICROALBUMIN  Never done  . HIV Screening  Never done  . PAP SMEAR-Modifier  Never done    There are no preventive care reminders to display for this patient.   No results found for: TSH Lab Results  Component Value Date   WBC 8.0 01/26/2020   HGB 11.3 (L) 01/26/2020   HCT 35.8 01/26/2020   MCV  81.2 01/26/2020   PLT 386 01/26/2020   Lab Results  Component Value Date   NA 139 01/19/2020   K 4.1 01/19/2020   CO2 28 01/19/2020  GLUCOSE 92 01/19/2020   BUN 12 01/19/2020   CREATININE 0.82 01/19/2020   BILITOT 0.3 01/19/2020   ALKPHOS 77 06/25/2019   AST 27 01/19/2020   ALT 32 (H) 01/19/2020   PROT 6.6 01/19/2020   ALBUMIN 3.1 (L) 06/25/2019   CALCIUM 8.7 01/19/2020   ANIONGAP 12 06/25/2019   Lab Results  Component Value Date   CHOL 227 (H) 01/19/2020   Lab Results  Component Value Date   HDL 66 01/19/2020   Lab Results  Component Value Date   LDLCALC 135 (H) 01/19/2020   Lab Results  Component Value Date   TRIG 139 01/19/2020   Lab Results  Component Value Date   CHOLHDL 3.4 01/19/2020   Lab Results  Component Value Date   HGBA1C 5.9 (H) 01/26/2020       Assessment & Plan:  Wound to right chest just under breast has healed of cellulitis and closed. Please continue to keep clean and dressed until closure of epidermis.  Follow up for s/s of infection or worsening wound as discussed.   Problem List Items Addressed This Visit    None    Visit Diagnoses    Follow-up exam after treatment    -  Primary     Follow Up: as needed for worsening or non resolving symptoms.    Elmore Guise, FNP

## 2020-03-16 ENCOUNTER — Ambulatory Visit (INDEPENDENT_AMBULATORY_CARE_PROVIDER_SITE_OTHER): Payer: PRIVATE HEALTH INSURANCE | Admitting: Obstetrics and Gynecology

## 2020-03-16 ENCOUNTER — Encounter: Payer: Self-pay | Admitting: Obstetrics and Gynecology

## 2020-03-16 ENCOUNTER — Other Ambulatory Visit: Payer: Self-pay

## 2020-03-16 ENCOUNTER — Other Ambulatory Visit (HOSPITAL_COMMUNITY)
Admission: RE | Admit: 2020-03-16 | Discharge: 2020-03-16 | Disposition: A | Discharge status: Home / Self Care | Payer: PRIVATE HEALTH INSURANCE | Source: Ambulatory Visit | Attending: Obstetrics and Gynecology | Admitting: Obstetrics and Gynecology

## 2020-03-16 VITALS — BP 137/79 | HR 97 | Temp 98.2°F | Ht 67.0 in | Wt 375.6 lb

## 2020-03-16 DIAGNOSIS — Z1151 Encounter for screening for human papillomavirus (HPV): Secondary | ICD-10-CM

## 2020-03-16 DIAGNOSIS — Z975 Presence of (intrauterine) contraceptive device: Secondary | ICD-10-CM | POA: Diagnosis not present

## 2020-03-16 DIAGNOSIS — Z01419 Encounter for gynecological examination (general) (routine) without abnormal findings: Secondary | ICD-10-CM | POA: Diagnosis present

## 2020-03-16 NOTE — Progress Notes (Signed)
New GYN presents for AEX/PAP.  Declines STD screening. She wants to lose weight.

## 2020-03-16 NOTE — Progress Notes (Signed)
Subjective:     Lacey Whitaker is a 34 y.o. female P3 with BMI 55 who is here for a comprehensive physical exam. The patient reports no problems. Patient is not currently sexually active. She has had a mirena IUD since 05/2014 for cycle control. She reports a monthly 3 day light period. Patient is without other complaints. She is interested in weight loss management.   Past Medical History:  Diagnosis Date  . Asthma   . Cyst, mediastinum    pt. reports having the cyst removed last year and has reoccured  . Diabetes mellitus type II, controlled ([place]) 01/27/2020  . Iron deficiency anemia due to chronic blood loss   . Morbid obesity ([place])    s/p gastric bypass surgery in 2009  . Pregnancy induced hypertension    2006   Past Surgical History:  Procedure Laterality Date  . BONE CYST EXCISION  2010  . CESAREAN SECTION    . CESAREAN SECTION  09/02/2011   Procedure: CESAREAN SECTION;  Surgeon: Delice Lesch, MD;  Location: [facility];  Service: Gynecology;  Laterality: N/A;  . GASTRIC BYPASS  05/2008  . GASTRIC BYPASS  2009  . WISDOM TOOTH EXTRACTION  09/2010   Family History  Problem Relation Age of Onset  . Asthma Mother   . Asthma Brother     Social History   Socioeconomic History  . Marital status: Single    Spouse name: Not on file  . Number of children: Not on file  . Years of education: Not on file  . Highest education level: Not on file  Occupational History  . Not on file  Tobacco Use  . Smoking status: Current Some Day Smoker    Types: Cigars  . Smokeless tobacco: Never Used  . Tobacco comment: Smokes 2 Black & Milds daily  Substance and Sexual Activity  . Alcohol use: Yes  . Drug use: No  . Sexual activity: Yes    Birth control/protection: I.U.D.  Other Topics Concern  . Not on file  Social History Narrative   Works at [employer].   3 children: Ages 65,6,2 y/o    Lives with her Mom, her brother, and her 3 children.       Social Determinants of Health    Financial Resource Strain:   . Difficulty of Paying Living Expenses:   Food Insecurity:   . Worried About Charity fundraiser in the Last Year:   . Arboriculturist in the Last Year:   Transportation Needs:   . Film/video editor (Medical):   Marland Kitchen Lack of Transportation (Non-Medical):   Physical Activity:   . Days of Exercise per Week:   . Minutes of Exercise per Session:   Stress:   . Feeling of Stress :   Social Connections:   . Frequency of Communication with Friends and Family:   . Frequency of Social Gatherings with Friends and Family:   . Attends Religious Services:   . Active Member of Clubs or Organizations:   . Attends Archivist Meetings:   Marland Kitchen Marital Status:   Intimate Partner Violence:   . Fear of Current or Ex-Partner:   . Emotionally Abused:   Marland Kitchen Physically Abused:   . Sexually Abused:    Health Maintenance  Topic Date Due  . PNEUMOCOCCAL POLYSACCHARIDE VACCINE AGE 66-64 HIGH RISK  Never done  . FOOT EXAM  Never done  . OPHTHALMOLOGY EXAM  Never done  . URINE MICROALBUMIN  Never done  . HIV Screening  Never done  . COVID-19 Vaccine (1) Never done  . PAP SMEAR-Modifier  Never done  . INFLUENZA VACCINE  06/20/2020  . HEMOGLOBIN A1C  07/28/2020  . TETANUS/TDAP  02/07/2023       Review of Systems Pertinent items noted in HPI and remainder of comprehensive ROS otherwise negative.   Objective:  Blood pressure 137/79, pulse 97, temperature 98.2 F (36.8 C), height 5\' 7"  (1.702 m), weight (!) 375 lb 9.6 oz (170.4 kg).     GENERAL: Well-developed, well-nourished female in no acute distress.  HEENT: Normocephalic, atraumatic. Sclerae anicteric.  NECK: Supple. Normal thyroid.  LUNGS: Clear to auscultation bilaterally.  HEART: Regular rate and rhythm. BREASTS: Symmetric in size. No palpable masses or lymphadenopathy, skin changes, or nipple drainage. ABDOMEN: Soft, nontender, nondistended. No organomegaly. PELVIC: Normal external female genitalia.  Vagina is pink and rugated.  Normal discharge. Normal appearing cervix. IUD strings not visualized. Bimanual exam limited secondary to body habitus EXTREMITIES: No cyanosis, clubbing, or edema, 2+ distal pulses.    Assessment:    Healthy female exam.      Plan:    Pap smear collected Pelvic ultrasound ordered to confirm presence of IUD RTC for IUD removal post ultrasound.  Patient will be contacted with abnormal results Patient referred to nutritionist See After Visit Summary for Counseling Recommendations

## 2020-03-19 LAB — CYTOLOGY - PAP
Comment: NEGATIVE
Diagnosis: NEGATIVE
High risk HPV: NEGATIVE

## 2020-03-28 ENCOUNTER — Emergency Department (HOSPITAL_COMMUNITY)
Admission: EM | Admit: 2020-03-28 | Discharge: 2020-03-28 | Disposition: A | Discharge status: Home / Self Care | Payer: PRIVATE HEALTH INSURANCE | Attending: Emergency Medicine | Admitting: Emergency Medicine

## 2020-03-28 ENCOUNTER — Other Ambulatory Visit: Payer: Self-pay

## 2020-03-28 ENCOUNTER — Encounter (HOSPITAL_COMMUNITY): Payer: Self-pay | Admitting: Emergency Medicine

## 2020-03-28 DIAGNOSIS — Z79899 Other long term (current) drug therapy: Secondary | ICD-10-CM | POA: Insufficient documentation

## 2020-03-28 DIAGNOSIS — F17290 Nicotine dependence, other tobacco product, uncomplicated: Secondary | ICD-10-CM | POA: Insufficient documentation

## 2020-03-28 DIAGNOSIS — E119 Type 2 diabetes mellitus without complications: Secondary | ICD-10-CM | POA: Insufficient documentation

## 2020-03-28 DIAGNOSIS — K0889 Other specified disorders of teeth and supporting structures: Secondary | ICD-10-CM | POA: Insufficient documentation

## 2020-03-28 DIAGNOSIS — J45909 Unspecified asthma, uncomplicated: Secondary | ICD-10-CM | POA: Insufficient documentation

## 2020-03-28 DIAGNOSIS — I10 Essential (primary) hypertension: Secondary | ICD-10-CM | POA: Insufficient documentation

## 2020-03-28 MED ORDER — BENZOCAINE 20 % MT GEL: 1.0000 "application " | Freq: Four times a day (QID) | OROMUCOSAL | 0 refills | Status: DC | PRN

## 2020-03-28 MED ORDER — KETOROLAC TROMETHAMINE 30 MG/ML IJ SOLN
30.0000 mg | Freq: Once | INTRAMUSCULAR | Status: AC
  Administered 2020-03-28: 30 mg via INTRAVENOUS
  Filled 2020-03-28: qty 1

## 2020-03-28 MED ORDER — PENICILLIN V POTASSIUM 500 MG PO TABS: 500.0000 mg | ORAL_TABLET | Freq: Four times a day (QID) | ORAL | 0 refills | Status: AC

## 2020-03-28 MED ORDER — NAPROXEN 500 MG PO TABS: 500.0000 mg | ORAL_TABLET | Freq: Two times a day (BID) | ORAL | 0 refills | Status: DC

## 2020-03-28 NOTE — ED Provider Notes (Signed)
[facility] EMERGENCY DEPARTMENT Provider Note   CSN: [id number] Arrival date & time: 03/28/20  2039     History Chief Complaint  Patient presents with  . Dental Pain    Lacey Whitaker is a 34 y.o. female with a past medical history significant for asthma, diabetes mellitus, iron deficiency anemia, and morbid obesity who presents to the ED due to left lower dental pain x3 days.  Patient notes she cracked her left lower posterior molar a while ago and has needed to get the tooth pulled, but she just recently got dental insurance. Patient called her dentist on Friday who requested patient to call back on Monday to schedule an appointment. Patient denies fever and chills. Notes pain is worse when chewing. She has not tried anything for pain. Denies trismus, changes to phonation, difficulties tolerating oral secretions, and difficulties breathing.   History obtained from patient and past medical records. No interpreter used during encounter.       Past Medical History:  Diagnosis Date  . Asthma   . Cyst, mediastinum    pt. reports having the cyst removed last year and has reoccured  . Diabetes mellitus type II, controlled (HCC) 01/27/2020  . Iron deficiency anemia due to chronic blood loss   . Morbid obesity (HCC)    s/p gastric bypass surgery in 2009  . Pregnancy induced hypertension    2006    Patient Active Problem List   Diagnosis Date Noted  . Diabetes mellitus type II, controlled (HCC) 01/27/2020  . Asthma, chronic   . HTN (hypertension)   . Iron deficiency anemia due to chronic blood loss   . Obesities, morbid (HCC) 08/10/2011    Past Surgical History:  Procedure Laterality Date  . BONE CYST EXCISION  2010  . CESAREAN SECTION    . CESAREAN SECTION  09/02/2011   Procedure: CESAREAN SECTION;  Surgeon: Purcell Nails, MD;  Location: [facility];  Service: Gynecology;  Laterality: N/A;  . GASTRIC BYPASS  05/2008  . GASTRIC BYPASS  2009  . WISDOM TOOTH  EXTRACTION  09/2010     OB History    Gravida  3   Para  3   Term  3   Preterm      AB      Living  3     SAB      TAB      Ectopic      Multiple      Live Births  2           Family History  Problem Relation Age of Onset  . Asthma Mother   . Asthma Brother     Social History   Tobacco Use  . Smoking status: Current Some Day Smoker    Types: Cigars  . Smokeless tobacco: Never Used  . Tobacco comment: Smokes 2 Black & Milds daily  Substance Use Topics  . Alcohol use: Yes  . Drug use: No    Home Medications Prior to Admission medications   Medication Sig Start Date End Date Taking? Authorizing Provider  albuterol (PROVENTIL HFA) 108 (90 Base) MCG/ACT inhaler Inhale 2 puffs into the lungs every 6 (six) hours as needed for wheezing or shortness of breath.    [provider]  benzocaine (HURRICAINE) 20 % GEL Use as directed 1 application in the mouth or throat 4 (four) times daily as needed. 03/28/20   Mannie Stabile, PA-C  diclofenac Sodium (VOLTAREN) 1 % GEL Apply  4 g topically 4 (four) times daily. 01/12/20   Darr, Marguerita Beards, PA-C  furosemide (LASIX) 20 MG tablet Take 1 tablet (20 mg total) by mouth daily. 01/09/19   Ashley Murrain, NP  levonorgestrel (MIRENA) 20 MCG/24HR IUD 1 each by Intrauterine route once.    [provider]  lisinopril-hydrochlorothiazide (ZESTORETIC) 20-12.5 MG tablet Take 2 tablets by mouth daily. 07/12/18 01/26/20  Katy Apo, NP  naproxen (NAPROSYN) 500 MG tablet Take 1 tablet (500 mg total) by mouth 2 (two) times daily. 03/28/20   Suzy Bouchard, PA-C  penicillin v potassium (VEETID) 500 MG tablet Take 1 tablet (500 mg total) by mouth 4 (four) times daily for 7 days. 03/28/20 04/04/20  Suzy Bouchard, PA-C  tiZANidine (ZANAFLEX) 2 MG tablet Take 2 mg by mouth every 6 (six) hours as needed for muscle spasms.    [provider]  Vitamin D, Ergocalciferol, (DRISDOL) 1.25 MG (50000 UT) CAPS capsule  Take 50,000 Units by mouth every Monday.    [provider]    Allergies    Patient has no known allergies.  Review of Systems   Review of Systems  Constitutional: Negative for chills and fever.  HENT: Positive for dental problem and facial swelling. Negative for sore throat, trouble swallowing and voice change.     Physical Exam Updated Vital Signs BP (!) 133/91 (BP Location: Right Arm)   Pulse 85   Temp 98.3 F (36.8 C) (Oral)   Resp 18   Ht 5\' 7"  (1.702 m)   Wt (!) 170.4 kg   SpO2 99%   BMI 58.84 kg/m   Physical Exam Vitals and nursing note reviewed.  Constitutional:      General: She is not in acute distress.    Appearance: She is not ill-appearing.  HENT:     Head: Normocephalic.     Mouth/Throat:     Comments: Left lower posterior molar cracked.  No surrounding gingivitis.  No abscess appreciated on exam.  No trismus.  Tongue in normal position without protrusion.  No tenderness below tongue.  No overlying cheek edema. Eyes:     Pupils: Pupils are equal, round, and reactive to light.  Neck:     Comments: No meningismus. Cardiovascular:     Rate and Rhythm: Normal rate and regular rhythm.     Pulses: Normal pulses.     Heart sounds: Normal heart sounds. No murmur. No friction rub. No gallop.   Pulmonary:     Effort: Pulmonary effort is normal.     Breath sounds: Normal breath sounds.  Abdominal:     General: Abdomen is flat. There is no distension.     Palpations: Abdomen is soft.     Tenderness: There is no abdominal tenderness. There is no guarding or rebound.  Musculoskeletal:     Cervical back: Neck supple.     Comments: Able to move all 4 extremities without difficulty.  Skin:    General: Skin is warm and dry.  Neurological:     General: No focal deficit present.     Mental Status: She is alert.  Psychiatric:        Mood and Affect: Mood normal.        Behavior: Behavior normal.     ED Results / Procedures / Treatments   Labs (all  labs ordered are listed, but only abnormal results are displayed) Labs Reviewed - No data to display  EKG None  Radiology No results found.  Procedures Procedures (including critical care time)  Medications Ordered in ED Medications  ketorolac (TORADOL) 30 MG/ML injection 30 mg (has no administration in time range)    ED Course  I have reviewed the triage vital signs and the nursing notes.  Pertinent labs & imaging results that were available during my care of the patient were reviewed by me and considered in my medical decision making (see chart for details).    MDM Rules/Calculators/A&P                      34 year old female presents to the ED due to left lower dental pain x3 days.  She cracked her left lower posterior molar months ago, but just recently got dental insurance for tooth extraction. Patient has a dentist she sees regularly.  No fever or chills.  Upon arrival, patient is afebrile, not tachycardic or hypoxic.  Patient in no acute distress, but tearful during exam due to pain.  Left lower posterior molar cracked.  No signs of gingivitis.  No abscess appreciated on exam.  No concern for Ludwigs or deep space infection.  No meningismus to suggest meningitis.  Patient given Toradol here in the ED for symptomatic relief. Will discharge patient with antibiotics, pain medication, and benzocaine gel.  Advised patient to call dentist tomorrow for further evaluation. Strict ED precautions discussed with patient. Patient states understanding and agrees to plan. Patient discharged home in no acute distress and stable vitals  Final Clinical Impression(s) / ED Diagnoses Final diagnoses:  Pain, dental    Rx / DC Orders ED Discharge Orders         Ordered    naproxen (NAPROSYN) 500 MG tablet  2 times daily     03/28/20 2153    penicillin v potassium (VEETID) 500 MG tablet  4 times daily     03/28/20 2153    benzocaine (HURRICAINE) 20 % GEL  4 times daily PRN     03/28/20 2153            Jesusita Oka 03/28/20 2156    Gwyneth Sprout, MD 03/29/20 0008

## 2020-03-28 NOTE — ED Triage Notes (Signed)
Pt c/o left side dental pain since last Friday getting worse.

## 2020-03-28 NOTE — Discharge Instructions (Addendum)
As discussed, I am sending home with pain medication, antibiotics, and numbing gel.  Please take as prescribed.  Call your dentist tomorrow for further evaluation.  Return to the ER for new or worsening symptoms.

## 2020-03-29 ENCOUNTER — Ambulatory Visit: Admission: RE | Admit: 2020-03-29 | Payer: PRIVATE HEALTH INSURANCE | Source: Ambulatory Visit

## 2020-05-19 ENCOUNTER — Ambulatory Visit: Payer: PRIVATE HEALTH INSURANCE | Admitting: Skilled Nursing Facility1

## 2020-07-24 ENCOUNTER — Ambulatory Visit (HOSPITAL_COMMUNITY)
Admission: EM | Admit: 2020-07-24 | Discharge: 2020-07-24 | Disposition: A | Discharge status: Home / Self Care | Payer: HRSA Program | Attending: Family Medicine | Admitting: Family Medicine

## 2020-07-24 ENCOUNTER — Other Ambulatory Visit: Payer: Self-pay

## 2020-07-24 ENCOUNTER — Encounter (HOSPITAL_COMMUNITY): Payer: Self-pay | Admitting: Gynecology

## 2020-07-24 DIAGNOSIS — Z79899 Other long term (current) drug therapy: Secondary | ICD-10-CM | POA: Diagnosis not present

## 2020-07-24 DIAGNOSIS — J069 Acute upper respiratory infection, unspecified: Secondary | ICD-10-CM

## 2020-07-24 DIAGNOSIS — D500 Iron deficiency anemia secondary to blood loss (chronic): Secondary | ICD-10-CM | POA: Diagnosis not present

## 2020-07-24 DIAGNOSIS — I10 Essential (primary) hypertension: Secondary | ICD-10-CM | POA: Insufficient documentation

## 2020-07-24 DIAGNOSIS — U071 COVID-19: Secondary | ICD-10-CM | POA: Diagnosis not present

## 2020-07-24 DIAGNOSIS — Z9884 Bariatric surgery status: Secondary | ICD-10-CM | POA: Diagnosis not present

## 2020-07-24 DIAGNOSIS — Z6841 Body Mass Index (BMI) 40.0 and over, adult: Secondary | ICD-10-CM | POA: Diagnosis not present

## 2020-07-24 DIAGNOSIS — E119 Type 2 diabetes mellitus without complications: Secondary | ICD-10-CM | POA: Diagnosis not present

## 2020-07-24 DIAGNOSIS — Z7952 Long term (current) use of systemic steroids: Secondary | ICD-10-CM | POA: Insufficient documentation

## 2020-07-24 DIAGNOSIS — Z791 Long term (current) use of non-steroidal anti-inflammatories (NSAID): Secondary | ICD-10-CM | POA: Insufficient documentation

## 2020-07-24 DIAGNOSIS — Z975 Presence of (intrauterine) contraceptive device: Secondary | ICD-10-CM | POA: Insufficient documentation

## 2020-07-24 DIAGNOSIS — R197 Diarrhea, unspecified: Secondary | ICD-10-CM

## 2020-07-24 MED ORDER — IBUPROFEN 800 MG PO TABS: 800.0000 mg | ORAL_TABLET | Freq: Three times a day (TID) | ORAL | 0 refills | Status: DC

## 2020-07-24 MED ORDER — PREDNISONE 10 MG (21) PO TBPK: ORAL_TABLET | Freq: Every day | ORAL | 0 refills | Status: DC

## 2020-07-24 MED ORDER — OMEPRAZOLE 20 MG PO CPDR: 20.0000 mg | DELAYED_RELEASE_CAPSULE | Freq: Every day | ORAL | 0 refills | Status: DC

## 2020-07-24 NOTE — ED Provider Notes (Signed)
[facility]    CSN: [id number] Arrival date & time: 07/24/20  1226      History   Chief Complaint Chief Complaint  Patient presents with  . Fatigue    HPI Lacey Whitaker is a 34 y.o. female.   Lacey Whitaker presents with complaints of chest tightness which started 9/2. Yesterday she woke with body aches, back ache, headache, diarrhea, chills. Yesterday took tylenol which did help with pain. Today feels similar but with some improvement in body aches. No urinary symptoms. Still with diarrhea. No nausea or vomiting. Cough, runny nose. Has been taking mucinex. Daughter has been ill as well, with symptoms starting prior to hers. Temp of 100.1 initially. Today felt hot like she had a fever, took medication this morning around 730. Some shortness of breath. Does have an inhaler which hasn't necessarily helped today.    ROS per HPI, negative if not otherwise mentioned.      Past Medical History:  Diagnosis Date  . Asthma   . Cyst, mediastinum    pt. reports having the cyst removed last year and has reoccured  . Diabetes mellitus type II, controlled (HCC) 01/27/2020  . Iron deficiency anemia due to chronic blood loss   . Morbid obesity (HCC)    s/p gastric bypass surgery in 2009  . Pregnancy induced hypertension    2006    Patient Active Problem List   Diagnosis Date Noted  . Diabetes mellitus type II, controlled (HCC) 01/27/2020  . Asthma, chronic   . HTN (hypertension)   . Iron deficiency anemia due to chronic blood loss   . Obesities, morbid (HCC) 08/10/2011    Past Surgical History:  Procedure Laterality Date  . BONE CYST EXCISION  2010  . CESAREAN SECTION    . CESAREAN SECTION  09/02/2011   Procedure: CESAREAN SECTION;  Surgeon: Purcell Nails, MD;  Location: [facility];  Service: Gynecology;  Laterality: N/A;  . GASTRIC BYPASS  05/2008  . GASTRIC BYPASS  2009  . WISDOM TOOTH EXTRACTION  09/2010    OB History    Gravida  3   Para  3   Term  3    Preterm      AB      Living  3     SAB      TAB      Ectopic      Multiple      Live Births  2            Home Medications    Prior to Admission medications   Medication Sig Start Date End Date Taking? Authorizing Provider  albuterol (PROVENTIL HFA) 108 (90 Base) MCG/ACT inhaler Inhale 2 puffs into the lungs every 6 (six) hours as needed for wheezing or shortness of breath.   Yes [provider]  levonorgestrel (MIRENA) 20 MCG/24HR IUD 1 each by Intrauterine route once.   Yes [provider]  naproxen (NAPROSYN) 500 MG tablet Take 1 tablet (500 mg total) by mouth 2 (two) times daily. 03/28/20  Yes Aberman, Caroline C, PA-C  tiZANidine (ZANAFLEX) 2 MG tablet Take 2 mg by mouth every 6 (six) hours as needed for muscle spasms.   Yes [provider]  Vitamin D, Ergocalciferol, (DRISDOL) 1.25 MG (50000 UT) CAPS capsule Take 50,000 Units by mouth every Monday.   Yes [provider]  benzocaine (HURRICAINE) 20 % GEL Use as directed 1 application in the mouth or throat 4 (four) times daily as  needed. 03/28/20   Mannie Stabile, PA-C  diclofenac Sodium (VOLTAREN) 1 % GEL Apply 4 g topically 4 (four) times daily. 01/12/20   Darr, Veryl Speak, PA-C  furosemide (LASIX) 20 MG tablet Take 1 tablet (20 mg total) by mouth daily. 01/09/19   Janne Napoleon, NP  ibuprofen (ADVIL) 800 MG tablet Take 1 tablet (800 mg total) by mouth 3 (three) times daily. 07/24/20   Georgetta Haber, NP  lisinopril-hydrochlorothiazide (ZESTORETIC) 20-12.5 MG tablet Take 2 tablets by mouth daily. 07/12/18 01/26/20  Sudie Grumbling, NP  omeprazole (PRILOSEC) 20 MG capsule Take 1 capsule (20 mg total) by mouth daily. 07/24/20   Georgetta Haber, NP  predniSONE (STERAPRED UNI-PAK 21 TAB) 10 MG (21) TBPK tablet Take by mouth daily. Per box instruction 07/24/20   Georgetta Haber, NP    Family History Family History  Problem Relation Age of Onset  . Asthma Mother   . Asthma Brother      Social History Social History   Tobacco Use  . Smoking status: Current Some Day Smoker    Types: Cigars  . Smokeless tobacco: Never Used  . Tobacco comment: Smokes 2 Black & Milds daily  Vaping Use  . Vaping Use: Never used  Substance Use Topics  . Alcohol use: Yes  . Drug use: No     Allergies   Patient has no known allergies.   Review of Systems Review of Systems   Physical Exam Triage Vital Signs ED Triage Vitals  Enc Vitals Group     BP 07/24/20 1508 131/77     Pulse Rate 07/24/20 1508 84     Resp 07/24/20 1508 18     Temp 07/24/20 1508 99.6 F (37.6 C)     Temp Source 07/24/20 1508 Oral     SpO2 07/24/20 1508 98 %     Weight 07/24/20 1507 (!) 380 lb (172.4 kg)     Height 07/24/20 1507 5\' 7"  (1.702 m)     Head Circumference --      Peak Flow --      Pain Score 07/24/20 1506 8     Pain Loc --      Pain Edu? --      Excl. in GC? --    No data found.  Updated Vital Signs BP 131/77 (BP Location: Left Arm)   Pulse 84   Temp 99.6 F (37.6 C) (Oral)   Resp 18   Ht 5\' 7"  (1.702 m)   Wt (!) 380 lb (172.4 kg)   SpO2 98%   BMI 59.52 kg/m   Visual Acuity Right Eye Distance:   Left Eye Distance:   Bilateral Distance:    Right Eye Near:   Left Eye Near:    Bilateral Near:     Physical Exam Constitutional:      General: She is not in acute distress.    Appearance: She is well-developed.  Cardiovascular:     Rate and Rhythm: Normal rate.  Pulmonary:     Effort: Pulmonary effort is normal.  Skin:    General: Skin is warm and dry.  Neurological:     Mental Status: She is alert and oriented to person, place, and time.      UC Treatments / Results  Labs (all labs ordered are listed, but only abnormal results are displayed) Labs Reviewed  SARS CORONAVIRUS 2 (TAT 6-24 HRS)    EKG   Radiology No results found.  Procedures Procedures (including  critical care time)  Medications Ordered in UC Medications - No data to  display  Initial Impression / Assessment and Plan / UC Course  I have reviewed the triage vital signs and the nursing notes.  Pertinent labs & imaging results that were available during my care of the patient were reviewed by me and considered in my medical decision making (see chart for details).     Non toxic. Benign physical exam.  No work of breathing. Approximately 24 hours of symptoms. Vitals stable. Daughter also with similar illness. History and physical consistent with viral illness.  Covid testing pending and isolation instructions provided.  Supportive cares recommended. Have and hold prednisone provided, as patient is asthmatic. Blood sugar monitoring discussed. Return precautions provided. Patient verbalized understanding and agreeable to plan.   Final Clinical Impressions(s) / UC Diagnoses   Final diagnoses:  Acute upper respiratory infection  Diarrhea, unspecified type     Discharge Instructions     Push fluids to ensure adequate hydration and keep secretions thin.  Tylenol and/or ibuprofen as needed for pain or fevers.  Omeprazole to be taken with ibuprofen and take with food.  Rest.  May fill prednisone if develop wheezing, shortness of breath  or chest tightness. Be mindful of your blood sugars if you take this.  Self isolate until covid results are back and negative.  Will notify you by phone of any positive findings. Your negative results will be sent through your MyChart.     Please return to be seen for any worsening of symptoms.     ED Prescriptions    Medication Sig Dispense Auth. Provider   ibuprofen (ADVIL) 800 MG tablet Take 1 tablet (800 mg total) by mouth 3 (three) times daily. 30 tablet Linus Mako B, NP   omeprazole (PRILOSEC) 20 MG capsule Take 1 capsule (20 mg total) by mouth daily. 30 capsule Linus Mako B, NP   predniSONE (STERAPRED UNI-PAK 21 TAB) 10 MG (21) TBPK tablet Take by mouth daily. Per box instruction 21 tablet Georgetta Haber, NP      PDMP not reviewed this encounter.   Georgetta Haber, NP 07/24/20 [id number]

## 2020-07-24 NOTE — Discharge Instructions (Signed)
Push fluids to ensure adequate hydration and keep secretions thin.  Tylenol and/or ibuprofen as needed for pain or fevers.  Omeprazole to be taken with ibuprofen and take with food.  Rest.  May fill prednisone if develop wheezing, shortness of breath  or chest tightness. Be mindful of your blood sugars if you take this.  Self isolate until covid results are back and negative.  Will notify you by phone of any positive findings. Your negative results will be sent through your MyChart.     Please return to be seen for any worsening of symptoms.

## 2020-07-24 NOTE — ED Triage Notes (Signed)
Per patient with body ache/ diarrhea , loss of smell and taste , fatigue x 3 days.

## 2020-07-25 LAB — SARS CORONAVIRUS 2 (TAT 6-24 HRS): SARS Coronavirus 2: POSITIVE — AB

## 2020-08-01 ENCOUNTER — Other Ambulatory Visit: Payer: Self-pay

## 2020-08-01 ENCOUNTER — Inpatient Hospital Stay (HOSPITAL_COMMUNITY)
Admission: EM | Admit: 2020-08-01 | Discharge: 2020-08-05 | DRG: 177 | Disposition: A | Discharge status: Home / Self Care | Payer: Medicaid Other | Attending: Internal Medicine | Admitting: Internal Medicine

## 2020-08-01 ENCOUNTER — Emergency Department (HOSPITAL_COMMUNITY): Payer: Medicaid Other

## 2020-08-01 ENCOUNTER — Encounter (HOSPITAL_COMMUNITY): Payer: Self-pay | Admitting: Emergency Medicine

## 2020-08-01 DIAGNOSIS — E119 Type 2 diabetes mellitus without complications: Secondary | ICD-10-CM | POA: Diagnosis present

## 2020-08-01 DIAGNOSIS — R7401 Elevation of levels of liver transaminase levels: Secondary | ICD-10-CM | POA: Diagnosis present

## 2020-08-01 DIAGNOSIS — F419 Anxiety disorder, unspecified: Secondary | ICD-10-CM | POA: Diagnosis present

## 2020-08-01 DIAGNOSIS — Z975 Presence of (intrauterine) contraceptive device: Secondary | ICD-10-CM | POA: Diagnosis not present

## 2020-08-01 DIAGNOSIS — J4521 Mild intermittent asthma with (acute) exacerbation: Secondary | ICD-10-CM | POA: Diagnosis present

## 2020-08-01 DIAGNOSIS — Z825 Family history of asthma and other chronic lower respiratory diseases: Secondary | ICD-10-CM | POA: Diagnosis not present

## 2020-08-01 DIAGNOSIS — F17290 Nicotine dependence, other tobacco product, uncomplicated: Secondary | ICD-10-CM | POA: Diagnosis present

## 2020-08-01 DIAGNOSIS — I509 Heart failure, unspecified: Secondary | ICD-10-CM

## 2020-08-01 DIAGNOSIS — I10 Essential (primary) hypertension: Secondary | ICD-10-CM | POA: Diagnosis present

## 2020-08-01 DIAGNOSIS — I351 Nonrheumatic aortic (valve) insufficiency: Secondary | ICD-10-CM | POA: Diagnosis present

## 2020-08-01 DIAGNOSIS — Z79899 Other long term (current) drug therapy: Secondary | ICD-10-CM | POA: Diagnosis not present

## 2020-08-01 DIAGNOSIS — R7989 Other specified abnormal findings of blood chemistry: Secondary | ICD-10-CM | POA: Diagnosis not present

## 2020-08-01 DIAGNOSIS — Z9884 Bariatric surgery status: Secondary | ICD-10-CM

## 2020-08-01 DIAGNOSIS — M25473 Effusion, unspecified ankle: Secondary | ICD-10-CM | POA: Diagnosis present

## 2020-08-01 DIAGNOSIS — J1282 Pneumonia due to coronavirus disease 2019: Secondary | ICD-10-CM | POA: Diagnosis present

## 2020-08-01 DIAGNOSIS — Z791 Long term (current) use of non-steroidal anti-inflammatories (NSAID): Secondary | ICD-10-CM | POA: Diagnosis not present

## 2020-08-01 DIAGNOSIS — U071 COVID-19: Secondary | ICD-10-CM | POA: Diagnosis present

## 2020-08-01 DIAGNOSIS — Z6835 Body mass index (BMI) 35.0-35.9, adult: Secondary | ICD-10-CM | POA: Diagnosis not present

## 2020-08-01 DIAGNOSIS — G4733 Obstructive sleep apnea (adult) (pediatric): Secondary | ICD-10-CM | POA: Diagnosis present

## 2020-08-01 DIAGNOSIS — I469 Cardiac arrest, cause unspecified: Secondary | ICD-10-CM | POA: Diagnosis not present

## 2020-08-01 DIAGNOSIS — J9601 Acute respiratory failure with hypoxia: Secondary | ICD-10-CM | POA: Diagnosis present

## 2020-08-01 LAB — LACTIC ACID, PLASMA: Lactic Acid, Venous: 1 mmol/L (ref 0.5–1.9)

## 2020-08-01 LAB — BASIC METABOLIC PANEL
Anion gap: 10 (ref 5–15)
BUN: <5 mg/dL — ABNORMAL LOW (ref 6–20)
CO2: 26 mmol/L (ref 22–32)
Calcium: 7.9 mg/dL — ABNORMAL LOW (ref 8.9–10.3)
Chloride: 104 mmol/L (ref 98–111)
Creatinine, Ser: 0.88 mg/dL (ref 0.44–1.00)
GFR calc Af Amer: >60 mL/min (ref >60)
GFR calc non Af Amer: >60 mL/min (ref >60)
Glucose, Bld: 97 mg/dL (ref 70–99)
Potassium: 3.4 mmol/L — ABNORMAL LOW (ref 3.5–5.1)
Sodium: 140 mmol/L (ref 135–145)

## 2020-08-01 LAB — I-STAT BETA HCG BLOOD, ED (MC, WL, AP ONLY): I-stat hCG, quantitative: <5 m[IU]/mL (ref <5)

## 2020-08-01 LAB — CBC
HCT: 39.3 % (ref 36.0–46.0)
Hemoglobin: 12 g/dL (ref 12.0–15.0)
MCH: 24.9 pg — ABNORMAL LOW (ref 26.0–34.0)
MCHC: 30.5 g/dL (ref 30.0–36.0)
MCV: 81.7 fL (ref 80.0–100.0)
Platelets: 276 10*3/uL (ref 150–400)
RBC: 4.81 MIL/uL (ref 3.87–5.11)
RDW: 16.7 % — ABNORMAL HIGH (ref 11.5–15.5)
WBC: 5.8 10*3/uL (ref 4.0–10.5)
nRBC: 0 % (ref 0.0–0.2)

## 2020-08-01 LAB — C-REACTIVE PROTEIN: CRP: 9.3 mg/dL — ABNORMAL HIGH (ref <1.0)

## 2020-08-01 LAB — HIV ANTIBODY (ROUTINE TESTING W REFLEX): HIV Screen 4th Generation wRfx: NONREACTIVE

## 2020-08-01 LAB — TRIGLYCERIDES: Triglycerides: 133 mg/dL (ref <150)

## 2020-08-01 LAB — LACTATE DEHYDROGENASE: LDH: 395 U/L — ABNORMAL HIGH (ref 98–192)

## 2020-08-01 LAB — FIBRINOGEN: Fibrinogen: 653 mg/dL — ABNORMAL HIGH (ref 210–475)

## 2020-08-01 LAB — FERRITIN: Ferritin: 83 ng/mL (ref 11–307)

## 2020-08-01 LAB — TROPONIN I (HIGH SENSITIVITY)
Troponin I (High Sensitivity): 10 ng/L (ref <18)
Troponin I (High Sensitivity): 4 ng/L (ref <18)

## 2020-08-01 LAB — PROCALCITONIN: Procalcitonin: <0.1 ng/mL

## 2020-08-01 LAB — D-DIMER, QUANTITATIVE: D-Dimer, Quant: 0.73 ug/mL-FEU — ABNORMAL HIGH (ref 0.00–0.50)

## 2020-08-01 MED ORDER — GUAIFENESIN ER 600 MG PO TB12
1200.0000 mg | ORAL_TABLET | Freq: Two times a day (BID) | ORAL | Status: DC
  Administered 2020-08-01 – 2020-08-05 (×8): 1200 mg via ORAL
  Filled 2020-08-01 (×8): qty 2

## 2020-08-01 MED ORDER — LISINOPRIL 20 MG PO TABS: 20.0000 mg | ORAL_TABLET | Freq: Every day | ORAL | Status: DC

## 2020-08-01 MED ORDER — ENOXAPARIN SODIUM 100 MG/ML ~~LOC~~ SOLN
85.0000 mg | SUBCUTANEOUS | Status: DC
  Administered 2020-08-01 – 2020-08-04 (×4): 85 mg via SUBCUTANEOUS
  Filled 2020-08-01: qty 0.85
  Filled 2020-08-01: qty 1
  Filled 2020-08-01: qty 0.85
  Filled 2020-08-01 (×2): qty 1

## 2020-08-01 MED ORDER — DEXAMETHASONE 4 MG PO TABS: 6.0000 mg | ORAL_TABLET | Freq: Every day | ORAL | Status: DC

## 2020-08-01 MED ORDER — SODIUM CHLORIDE 0.9 % IV SOLN
200.0000 mg | Freq: Once | INTRAVENOUS | Status: AC
  Administered 2020-08-02: 200 mg via INTRAVENOUS
  Filled 2020-08-01: qty 40

## 2020-08-01 MED ORDER — VITAMIN D (ERGOCALCIFEROL) 1.25 MG (50000 UNIT) PO CAPS
50000.0000 [IU] | ORAL_CAPSULE | ORAL | Status: DC
  Administered 2020-08-02: 50000 [IU] via ORAL
  Filled 2020-08-01: qty 1

## 2020-08-01 MED ORDER — KETOROLAC TROMETHAMINE 15 MG/ML IJ SOLN
15.0000 mg | Freq: Once | INTRAMUSCULAR | Status: AC
  Administered 2020-08-01: 15 mg via INTRAVENOUS
  Filled 2020-08-01: qty 1

## 2020-08-01 MED ORDER — POTASSIUM CHLORIDE CRYS ER 20 MEQ PO TBCR
40.0000 meq | EXTENDED_RELEASE_TABLET | Freq: Once | ORAL | Status: AC
  Administered 2020-08-01: 40 meq via ORAL
  Filled 2020-08-01: qty 2

## 2020-08-01 MED ORDER — ONDANSETRON HCL 4 MG PO TABS: 4.0000 mg | ORAL_TABLET | Freq: Four times a day (QID) | ORAL | Status: DC | PRN

## 2020-08-01 MED ORDER — PANTOPRAZOLE SODIUM 40 MG PO TBEC
40.0000 mg | DELAYED_RELEASE_TABLET | Freq: Every day | ORAL | Status: DC
  Administered 2020-08-01 – 2020-08-05 (×5): 40 mg via ORAL
  Filled 2020-08-01 (×5): qty 1

## 2020-08-01 MED ORDER — METHYLPREDNISOLONE SODIUM SUCC 40 MG IJ SOLR
40.0000 mg | Freq: Four times a day (QID) | INTRAMUSCULAR | Status: DC
  Administered 2020-08-01 – 2020-08-02 (×2): 40 mg via INTRAVENOUS
  Filled 2020-08-01 (×2): qty 1

## 2020-08-01 MED ORDER — ALBUTEROL SULFATE HFA 108 (90 BASE) MCG/ACT IN AERS
2.0000 | INHALATION_SPRAY | RESPIRATORY_TRACT | Status: DC
  Administered 2020-08-02 – 2020-08-05 (×17): 2 via RESPIRATORY_TRACT
  Filled 2020-08-01: qty 6.7

## 2020-08-01 MED ORDER — CYCLOBENZAPRINE HCL 10 MG PO TABS
5.0000 mg | ORAL_TABLET | Freq: Once | ORAL | Status: AC
  Administered 2020-08-02: 5 mg via ORAL
  Filled 2020-08-01: qty 1

## 2020-08-01 MED ORDER — BUDESONIDE 0.25 MG/2ML IN SUSP
0.2500 mg | Freq: Two times a day (BID) | RESPIRATORY_TRACT | Status: DC
  Administered 2020-08-02: 0.25 mg via RESPIRATORY_TRACT
  Filled 2020-08-01 (×4): qty 2

## 2020-08-01 MED ORDER — ACETAMINOPHEN 325 MG PO TABS
650.0000 mg | ORAL_TABLET | Freq: Once | ORAL | Status: AC
  Administered 2020-08-01: 650 mg via ORAL

## 2020-08-01 MED ORDER — ALBUTEROL SULFATE HFA 108 (90 BASE) MCG/ACT IN AERS
8.0000 | INHALATION_SPRAY | Freq: Once | RESPIRATORY_TRACT | Status: AC
  Administered 2020-08-01: 8 via RESPIRATORY_TRACT

## 2020-08-01 MED ORDER — BARICITINIB 2 MG PO TABS
4.0000 mg | ORAL_TABLET | Freq: Every day | ORAL | Status: DC
  Administered 2020-08-01 – 2020-08-05 (×5): 4 mg via ORAL
  Filled 2020-08-01 (×5): qty 2

## 2020-08-01 MED ORDER — SODIUM CHLORIDE 0.9 % IV SOLN
100.0000 mg | Freq: Every day | INTRAVENOUS | Status: AC
  Administered 2020-08-02 – 2020-08-05 (×4): 100 mg via INTRAVENOUS
  Filled 2020-08-01 (×2): qty 20
  Filled 2020-08-01: qty 100
  Filled 2020-08-01: qty 20

## 2020-08-01 MED ORDER — ONDANSETRON HCL 4 MG/2ML IJ SOLN: 4.0000 mg | Freq: Four times a day (QID) | INTRAMUSCULAR | Status: DC | PRN

## 2020-08-01 MED ORDER — HYDROCHLOROTHIAZIDE 12.5 MG PO CAPS: 12.5000 mg | ORAL_CAPSULE | Freq: Every day | ORAL | Status: DC

## 2020-08-01 MED ORDER — FUROSEMIDE 10 MG/ML IJ SOLN
40.0000 mg | Freq: Once | INTRAMUSCULAR | Status: AC
  Administered 2020-08-01: 40 mg via INTRAVENOUS
  Filled 2020-08-01: qty 4

## 2020-08-01 MED ORDER — ACETAMINOPHEN 325 MG PO TABS
650.0000 mg | ORAL_TABLET | Freq: Four times a day (QID) | ORAL | Status: DC | PRN
  Administered 2020-08-03: 650 mg via ORAL
  Filled 2020-08-01 (×3): qty 2

## 2020-08-01 MED ORDER — TIZANIDINE HCL 4 MG PO TABS
2.0000 mg | ORAL_TABLET | Freq: Four times a day (QID) | ORAL | Status: DC | PRN
  Administered 2020-08-04: 2 mg via ORAL
  Filled 2020-08-01: qty 1

## 2020-08-01 NOTE — ED Triage Notes (Signed)
COVID + 9/5.  Reports pain to center of chest, generalized weakness, and SOB x 2 days.

## 2020-08-01 NOTE — ED Provider Notes (Signed)
[facility] Provider Note   CSN: [id number] Arrival date & time: 08/01/20  1118     History Chief Complaint  Patient presents with  . Covid Positive  . Chest Pain    Lacey Whitaker is a 34 y.o. female past medical history of anxiety, anemia, type 2 diabetes, morbid obesity status post gastric bypass presents the ED today for shortness of breath in the setting of known COVID-19.  Patient reportedly tested +1-week prior to presentation, is now reporting chest pain weakness and shortness of breath particularly this morning gradually worsening since onset.  She states that she took a couple puffs of albuterol this morning without improvement so presented here.  Her chest discomfort she describes as a soreness worsened with coughing.  The history is provided by the patient.  Illness Quality:  SOB Severity:  Severe Onset quality:  Gradual Duration:  1 day Timing:  Constant Progression:  Worsening Chronicity:  New Context:  Known COVID Associated symptoms: chest pain, congestion, cough and shortness of breath   Associated symptoms: no rash        Past Medical History:  Diagnosis Date  . Asthma   . Cyst, mediastinum    pt. reports having the cyst removed last year and has reoccured  . Diabetes mellitus type II, controlled (HCC) 01/27/2020  . Iron deficiency anemia due to chronic blood loss   . Morbid obesity (HCC)    s/p gastric bypass surgery in 2009  . Pregnancy induced hypertension    2006    Patient Active Problem List   Diagnosis Date Noted  . COVID-19 08/01/2020  . Diabetes mellitus type II, controlled (HCC) 01/27/2020  . Asthma, chronic   . HTN (hypertension)   . Iron deficiency anemia due to chronic blood loss   . Obesities, morbid (HCC) 08/10/2011    Past Surgical History:  Procedure Laterality Date  . BONE CYST EXCISION  2010  . CESAREAN SECTION    . CESAREAN SECTION  09/02/2011   Procedure: CESAREAN SECTION;   Surgeon: Purcell NailsAngela Y Roberts, MD;  Location: [facility];  Service: Gynecology;  Laterality: N/A;  . GASTRIC BYPASS  05/2008  . GASTRIC BYPASS  2009  . WISDOM TOOTH EXTRACTION  09/2010     OB History    Gravida  3   Para  3   Term  3   Preterm      AB      Living  3     SAB      TAB      Ectopic      Multiple      Live Births  2           Family History  Problem Relation Age of Onset  . Asthma Mother   . Asthma Brother     Social History   Tobacco Use  . Smoking status: Current Some Day Smoker    Types: Cigars  . Smokeless tobacco: Never Used  . Tobacco comment: Smokes 2 Black & Milds daily  Vaping Use  . Vaping Use: Never used  Substance Use Topics  . Alcohol use: Yes  . Drug use: No    Home Medications Prior to Admission medications   Medication Sig Start Date End Date Taking? Authorizing Provider  albuterol (PROVENTIL HFA) 108 (90 Base) MCG/ACT inhaler Inhale 2 puffs into the lungs every 6 (six) hours as needed for wheezing or shortness of breath.   Yes [provider]  ibuprofen (  ADVIL) 800 MG tablet Take 1 tablet (800 mg total) by mouth 3 (three) times daily. 07/24/20  Yes Georgetta Haber, NP  levonorgestrel (MIRENA) 20 MCG/24HR IUD 1 each by Intrauterine route once.   Yes [provider]  lisinopril-hydrochlorothiazide (ZESTORETIC) 20-12.5 MG tablet Take 2 tablets by mouth daily. 07/12/18 08/01/20 Yes Amyot, Ali Lowe, NP  naproxen (NAPROSYN) 500 MG tablet Take 1 tablet (500 mg total) by mouth 2 (two) times daily. 03/28/20  Yes Aberman, Merla Riches, PA-C  omeprazole (PRILOSEC) 20 MG capsule Take 1 capsule (20 mg total) by mouth daily. 07/24/20  Yes Burky, Dorene Grebe B, NP  predniSONE (STERAPRED UNI-PAK 21 TAB) 10 MG (21) TBPK tablet Take by mouth daily. Per box instruction Patient taking differently: Take 10 mg by mouth See admin instructions. Per box instruction 07/24/20  Yes Burky, Dorene Grebe B, NP  tiZANidine (ZANAFLEX) 2 MG tablet Take 2 mg by  mouth every 6 (six) hours as needed for muscle spasms.   Yes [provider]  Vitamin D, Ergocalciferol, (DRISDOL) 1.25 MG (50000 UT) CAPS capsule Take 50,000 Units by mouth every Monday.   Yes [provider]  benzocaine (HURRICAINE) 20 % GEL Use as directed 1 application in the mouth or throat 4 (four) times daily as needed. Patient not taking: Reported on 08/01/2020 03/28/20   Mannie Stabile, PA-C  diclofenac Sodium (VOLTAREN) 1 % GEL Apply 4 g topically 4 (four) times daily. Patient not taking: Reported on 08/01/2020 01/12/20   Darr, Veryl Speak, PA-C  furosemide (LASIX) 20 MG tablet Take 1 tablet (20 mg total) by mouth daily. Patient not taking: Reported on 08/01/2020 01/09/19   Janne Napoleon, NP    Allergies    Patient has no known allergies.  Review of Systems   Review of Systems  Constitutional: Positive for chills.  HENT: Positive for congestion. Negative for facial swelling and voice change.   Eyes: Negative for redness and visual disturbance.  Respiratory: Positive for cough and shortness of breath.   Cardiovascular: Positive for chest pain. Negative for palpitations and leg swelling.  Genitourinary: Negative for difficulty urinating and dysuria.  Musculoskeletal: Negative for gait problem and joint swelling.  Skin: Negative for rash and wound.  Psychiatric/Behavioral: Negative for confusion and suicidal ideas.    Physical Exam Updated Vital Signs BP 121/81 (BP Location: Right Arm)   Pulse 90   Temp 99.8 F (37.7 C) (Oral)   Resp 18   SpO2 91%   Physical Exam Constitutional:      General: She is not in acute distress.    Appearance: She is obese.  HENT:     Head: Normocephalic and atraumatic.     Mouth/Throat:     Mouth: Mucous membranes are moist.     Pharynx: Oropharynx is clear.  Eyes:     General: No scleral icterus.    Pupils: Pupils are equal, round, and reactive to light.  Cardiovascular:     Rate and Rhythm: Normal rate and regular  rhythm.     Pulses: Normal pulses.  Pulmonary:     Effort: Respiratory distress present.     Comments: Trace wheezes present Abdominal:     General: There is no distension.     Tenderness: There is no abdominal tenderness.  Musculoskeletal:        General: No tenderness or deformity.     Cervical back: Normal range of motion and neck supple.  Neurological:     General: No focal deficit present.  Mental Status: She is alert and oriented to person, place, and time.  Psychiatric:        Mood and Affect: Mood normal.        Behavior: Behavior normal.     ED Results / Procedures / Treatments   Labs (all labs ordered are listed, but only abnormal results are displayed) Labs Reviewed  BASIC METABOLIC PANEL - Abnormal; Notable for the following components:      Result Value   Potassium 3.4 (*)    BUN <5 (*)    Calcium 7.9 (*)    All other components within normal limits  CBC - Abnormal; Notable for the following components:   MCH 24.9 (*)    RDW 16.7 (*)    All other components within normal limits  CULTURE, BLOOD (ROUTINE X 2)  CULTURE, BLOOD (ROUTINE X 2)  LACTIC ACID, PLASMA  LACTIC ACID, PLASMA  D-DIMER, QUANTITATIVE (NOT AT [facility])  PROCALCITONIN  LACTATE DEHYDROGENASE  FERRITIN  TRIGLYCERIDES  FIBRINOGEN  C-REACTIVE PROTEIN  HIV ANTIBODY (ROUTINE TESTING W REFLEX)  CBC WITH DIFFERENTIAL/PLATELET  C-REACTIVE PROTEIN  COMPREHENSIVE METABOLIC PANEL  D-DIMER, QUANTITATIVE (NOT AT [facility])  FERRITIN  PHOSPHORUS  MAGNESIUM  I-STAT BETA HCG BLOOD, ED ([facility], [facility], [facility] ONLY)  TROPONIN I (HIGH SENSITIVITY)  TROPONIN I (HIGH SENSITIVITY)    EKG None  Radiology DG Chest Portable 1 View  Result Date: 08/01/2020 CLINICAL DATA:  Short of breath, positive COVID EXAM: PORTABLE CHEST 1 VIEW COMPARISON:  Prior chest x-ray 06/25/2019 FINDINGS: Interval development of patchy interstitial and airspace opacities bilaterally. Stable cardiac and mediastinal contours given portable  frontal technique. Inspiratory volumes are low. No pneumothorax or pleural effusion. No acute osseous abnormality. IMPRESSION: 1. Developing bilateral interstitial and airspace opacities concerning for COVID pneumonia. 2. In the appropriate clinical setting, pulmonary edema could appear similar. This is considered less likely given patient's age and known COVID positive status. 3. Low inspiratory volumes. Electronically Signed   By: Malachy Moan M.D.   On: 08/01/2020 12:03    Procedures Procedures (including critical care time)  Medications Ordered in ED Medications  albuterol (VENTOLIN HFA) 108 (90 Base) MCG/ACT inhaler 8 puff (has no administration in time range)  ketorolac (TORADOL) 15 MG/ML injection 15 mg (has no administration in time range)  cyclobenzaprine (FLEXERIL) tablet 5 mg (has no administration in time range)  acetaminophen (TYLENOL) tablet 650 mg (has no administration in time range)  remdesivir 200 mg in sodium chloride 0.9% 250 mL IVPB (has no administration in time range)    Followed by  remdesivir 100 mg in sodium chloride 0.9 % 100 mL IVPB (has no administration in time range)  enoxaparin (LOVENOX) injection 85 mg (has no administration in time range)  baricitinib (OLUMIANT) tablet 4 mg (has no administration in time range)  methylPREDNISolone sodium succinate (SOLU-MEDROL) 40 mg/mL injection 40 mg (has no administration in time range)  acetaminophen (TYLENOL) tablet 650 mg (has no administration in time range)  ondansetron (ZOFRAN) tablet 4 mg (has no administration in time range)    Or  ondansetron (ZOFRAN) injection 4 mg (has no administration in time range)  furosemide (LASIX) injection 40 mg (has no administration in time range)  potassium chloride SA (KLOR-CON) CR tablet 40 mEq (has no administration in time range)  albuterol (VENTOLIN HFA) 108 (90 Base) MCG/ACT inhaler 2 puff (has no administration in time range)  guaiFENesin (MUCINEX) 12 hr tablet 1,200 mg  (has no administration in time range)  lisinopril-hydrochlorothiazide (ZESTORETIC) 20-12.5  MG per tablet 2 tablet (has no administration in time range)  pantoprazole (PROTONIX) EC tablet 40 mg (has no administration in time range)  tiZANidine (ZANAFLEX) tablet 2 mg (has no administration in time range)  Vitamin D (Ergocalciferol) (DRISDOL) capsule 50,000 Units (has no administration in time range)    ED Course  I have reviewed the triage vital signs and the nursing notes.  Pertinent labs & imaging results that were available during my care of the patient were reviewed by me and considered in my medical decision making (see chart for details).    MDM Rules/Calculators/A&P                          EKG findings by my read: Compared to prior: 06/25/2019.  Rate: 92 rhythm: sinus Axis: appropriate  PR: 152 QRS: 78 QTc: 464.  No evidence of ischemia or arrhythmia, nor any other pathologic findings concerning considering patient presentation. Findings discussed with attending who agrees.  Differential diagnosis considered: AHRF, PE, Covid PNA, Bacterial PNA, PTX  Patient presents ED for gradually worsening shortness of breath in setting of known COVID-19, on approximate a 7 of illness.  She is very obese but clearly dyspneic with some shortness of breath limiting speech.  On my initial evaluation, patient satting 86% at rest, placed on 4 L of nasal cannula with improvement to low 90s and subjectively improving.  Patient's chest pain is described as a soreness only with cough, will get D-dimer as part of Covid work-up though low suspicion for PE, EKG nonischemic and given this very atypical chest pain in the setting of known Covid and aggressive cough, have very low suspicion for ACS delta troponins are indicated this time.  We will measure Decadron remdesivir given new oxygen requirement at this time unable to arrange outpatient oxygen, feel patient would benefit from admission.  Hospital medicine  consult for admission. Accepted by Dr. Jobie Quaker reviewed and interpreted by myself with significant findings above. Imaging reviewed by myself and interpreted by radiologist.  Case and plan above discussed with my attending Dr. Clayborne Dana   Final Clinical Impression(s) / ED Diagnoses Final diagnoses:  Acute respiratory failure with hypoxia [facility])  Cardiac arrest [facility])    Rx / DC Orders ED Discharge Orders    None     Labs, studies and imaging reviewed by myself and considered in medical decision making if ordered. Imaging interpreted by radiology. Pt was discussed with my attending, Dr. Clayborne Dana  Electronically signed by:  Christiane Ha Redding9/12/20216:33 PM       Loree Fee, MD 08/01/20 5176    Marily Memos, MD 08/01/20 2030

## 2020-08-01 NOTE — H&P (Signed)
History and Physical    Lacey Whitaker ZSW:[id number] DOB: [birth year] DOA: 08/01/2020  PCP: Elmore Guise, FNP (Confirm with patient/family/NH records and if not entered, this has to be entered at [facility] point of entry) Patient coming from: Home  I have personally briefly reviewed patient's old medical records in [facility]  Chief Complaint: Cough, shortness of breath, chest pain  HPI: Lacey Whitaker is a 34 y.o. female with medical history significant of mild intermittent asthma, HTN, morbid obesity, iron deficiency anemia, presented with increasing short of breath, dry cough and pleuritic chest pain.  Patient was not vaccinated for COVID-19, started to have whole body aching, loose bowel movement diarrhea, loss of smell and fatigue about 9 to 10 days ago.  Came to ED 7 days ago on 09/04, when she was tested positive for COVID-19.  Continue to have low-grade fever T-max over 100.1-100.3 in the last 7 days.  Patient was discharged home with prednisone which she has not been taking.  She feels chest are congested " cough clear phlegm gurgling inside, but cannot cough up" also has aching-like chest pain worsening with each cough. ED Course: Desat easily with minimal activity.  Checks x-ray showed bilateral multifocal interstitial infiltrates compatible with COVID-19 pneumonia less likely CHF given patient's age.  WBC 5.8, potassium 3.4, creatinine 0.8.  Review of Systems: As per HPI otherwise 14 point review of systems negative.    Past Medical History:  Diagnosis Date  . Asthma   . Cyst, mediastinum    pt. reports having the cyst removed last year and has reoccured  . Diabetes mellitus type II, controlled (HCC) 01/27/2020  . Iron deficiency anemia due to chronic blood loss   . Morbid obesity (HCC)    s/p gastric bypass surgery in 2009  . Pregnancy induced hypertension    2006    Past Surgical History:  Procedure Laterality Date  . BONE CYST EXCISION  2010  . CESAREAN SECTION      . CESAREAN SECTION  09/02/2011   Procedure: CESAREAN SECTION;  Surgeon: Purcell Nails, MD;  Location: [facility] OR[facility];  Service: Gynecology;  Laterality: N/A;  . GASTRIC BYPASS  05/2008  . GASTRIC BYPASS  2009  . WISDOM TOOTH EXTRACTION  09/2010     reports that she has been smoking cigars. She has never used smokeless tobacco. She reports current alcohol use. She reports that she does not use drugs.  No Known Allergies  Family History  Problem Relation Age of Onset  . Asthma Mother   . Asthma Brother      Prior to Admission medications   Medication Sig Start Date End Date Taking? Authorizing Provider  albuterol (PROVENTIL HFA) 108 (90 Base) MCG/ACT inhaler Inhale 2 puffs into the lungs every 6 (six) hours as needed for wheezing or shortness of breath.   Yes [provider]  ibuprofen (ADVIL) 800 MG tablet Take 1 tablet (800 mg total) by mouth 3 (three) times daily. 07/24/20  Yes Georgetta Haber, NP  levonorgestrel (MIRENA) 20 MCG/24HR IUD 1 each by Intrauterine route once.   Yes [provider]  lisinopril-hydrochlorothiazide (ZESTORETIC) 20-12.5 MG tablet Take 2 tablets by mouth daily. 07/12/18 08/01/20 Yes Amyot, Ali Lowe, NP  naproxen (NAPROSYN) 500 MG tablet Take 1 tablet (500 mg total) by mouth 2 (two) times daily. 03/28/20  Yes Aberman, Merla Riches, PA-C  omeprazole (PRILOSEC) 20 MG capsule Take 1 capsule (20 mg total) by mouth daily. 07/24/20  Yes Georgetta Haber, NP  predniSONE (STERAPRED UNI-PAK 21 TAB) 10 MG (21) TBPK tablet Take by mouth daily. Per box instruction Patient taking differently: Take 10 mg by mouth See admin instructions. Per box instruction 07/24/20  Yes Burky, Dorene Grebe B, NP  tiZANidine (ZANAFLEX) 2 MG tablet Take 2 mg by mouth every 6 (six) hours as needed for muscle spasms.   Yes [provider]  Vitamin D, Ergocalciferol, (DRISDOL) 1.25 MG (50000 UT) CAPS capsule Take 50,000 Units by mouth every Monday.   Yes [provider]   benzocaine (HURRICAINE) 20 % GEL Use as directed 1 application in the mouth or throat 4 (four) times daily as needed. Patient not taking: Reported on 08/01/2020 03/28/20   Mannie Stabile, PA-C  diclofenac Sodium (VOLTAREN) 1 % GEL Apply 4 g topically 4 (four) times daily. Patient not taking: Reported on 08/01/2020 01/12/20   Darr, Veryl Speak, PA-C  furosemide (LASIX) 20 MG tablet Take 1 tablet (20 mg total) by mouth daily. Patient not taking: Reported on 08/01/2020 01/09/19   Janne Napoleon, NP    Physical Exam: Vitals:   08/01/20 1143 08/01/20 1245 08/01/20 1432  BP: 139/87 126/69 121/81  Pulse: 93 90 90  Resp: 18 (!) 22 18  Temp: 100 F (37.8 C) 100.1 F (37.8 C) 99.8 F (37.7 C)  TempSrc: Oral Oral Oral  SpO2: (!) 89% 91% 91%    Constitutional: NAD, calm, comfortable Vitals:   08/01/20 1143 08/01/20 1245 08/01/20 1432  BP: 139/87 126/69 121/81  Pulse: 93 90 90  Resp: 18 (!) 22 18  Temp: 100 F (37.8 C) 100.1 F (37.8 C) 99.8 F (37.7 C)  TempSrc: Oral Oral Oral  SpO2: (!) 89% 91% 91%   Eyes: PERRL, lids and conjunctivae normal ENMT: Mucous membranes are moist. Posterior pharynx clear of any exudate or lesions.Normal dentition.  Neck: normal, supple, no masses, no thyromegaly Respiratory: clear to auscultation bilaterally, diminished breathing sound bilaterally with scattered wheezing but no crackles.  Increased respiratory effort. No accessory muscle use.  Cardiovascular: Regular rate and rhythm, no murmurs / rubs / gallops.  1+ extremity edema. 2+ pedal pulses. No carotid bruits.  Abdomen: no tenderness, no masses palpated. No hepatosplenomegaly. Bowel sounds positive.  Musculoskeletal: no clubbing / cyanosis. No joint deformity upper and lower extremities. Good ROM, no contractures. Normal muscle tone.  Skin: no rashes, lesions, ulcers. No induration Neurologic: CN 2-12 grossly intact. Sensation intact, DTR normal. Strength 5/5 in all 4.  Psychiatric: Normal judgment and  insight. Alert and oriented x 3. Normal mood.     Labs on Admission: I have personally reviewed following labs and imaging studies  CBC: Recent Labs  Lab 08/01/20 1133  WBC 5.8  HGB 12.0  HCT 39.3  MCV 81.7  PLT 276   Basic Metabolic Panel: Recent Labs  Lab 08/01/20 1133  NA 140  K 3.4*  CL 104  CO2 26  GLUCOSE 97  BUN <5*  CREATININE 0.88  CALCIUM 7.9*   GFR: Estimated Creatinine Clearance: 147.8 mL/min (by C-G formula based on SCr of 0.88 mg/dL). Liver Function Tests: No results for input(s): AST, ALT, ALKPHOS, BILITOT, PROT, ALBUMIN in the last 168 hours. No results for input(s): LIPASE, AMYLASE in the last 168 hours. No results for input(s): AMMONIA in the last 168 hours. Coagulation Profile: No results for input(s): INR, PROTIME in the last 168 hours. Cardiac Enzymes: No results for input(s): CKTOTAL, CKMB, CKMBINDEX, TROPONINI in the last 168 hours. BNP (last 3 results) No results for  input(s): PROBNP in the last 8760 hours. HbA1C: No results for input(s): HGBA1C in the last 72 hours. CBG: No results for input(s): GLUCAP in the last 168 hours. Lipid Profile: No results for input(s): CHOL, HDL, LDLCALC, TRIG, CHOLHDL, LDLDIRECT in the last 72 hours. Thyroid Function Tests: No results for input(s): TSH, T4TOTAL, FREET4, T3FREE, THYROIDAB in the last 72 hours. Anemia Panel: No results for input(s): VITAMINB12, FOLATE, FERRITIN, TIBC, IRON, RETICCTPCT in the last 72 hours. Urine analysis:    Component Value Date/Time   COLORURINE YELLOW 01/06/2014 1915   APPEARANCEUR CLEAR 01/06/2014 1915   LABSPEC 1.018 01/06/2014 1915   PHURINE 6.5 01/06/2014 1915   GLUCOSEU NEGATIVE 01/06/2014 1915   HGBUR NEGATIVE 01/06/2014 1915   BILIRUBINUR NEGATIVE 01/06/2014 1915   KETONESUR NEGATIVE 01/06/2014 1915   PROTEINUR NEGATIVE 01/06/2014 1915   UROBILINOGEN 2.0 (H) 01/06/2014 1915   NITRITE NEGATIVE 01/06/2014 1915   LEUKOCYTESUR TRACE (A) 01/06/2014 1915     Radiological Exams on Admission: DG Chest Portable 1 View  Result Date: 08/01/2020 CLINICAL DATA:  Short of breath, positive COVID EXAM: PORTABLE CHEST 1 VIEW COMPARISON:  Prior chest x-ray 06/25/2019 FINDINGS: Interval development of patchy interstitial and airspace opacities bilaterally. Stable cardiac and mediastinal contours given portable frontal technique. Inspiratory volumes are low. No pneumothorax or pleural effusion. No acute osseous abnormality. IMPRESSION: 1. Developing bilateral interstitial and airspace opacities concerning for COVID pneumonia. 2. In the appropriate clinical setting, pulmonary edema could appear similar. This is considered less likely given patient's age and known COVID positive status. 3. Low inspiratory volumes. Electronically Signed   By: Malachy Moan M.D.   On: 08/01/2020 12:03    EKG: Independently reviewed.  Sinus rhythm, low voltage  Assessment/Plan Active Problems:   COVID-19  (please populate well all problems here in Problem List. (For example, if patient is on BP meds at home and you resume or decide to hold them, it is a problem that needs to be her. Same for CAD, COPD, HLD and so on)  Acute hypoxic respite failure secondary to COVID-19 pneumonia -Start remdesivir, Solu-Medrol every 6 hours and Olumiant given significance of pneumonia involving the left lung on x-ray and symptomatic wise. -Breathing meds and other supportive care, discussed with patient regarding prone position, patient expressed difficulty to prone due to body habitus. -Inflammation markers and D-dimer pending, consider PE study if markers trending up.  Chest pain patient has had appears to related to the strenuous cough.  Question of CHF -Patient has a chronic ankle swelling which became worse since she had Covid -Chest x-ray showed both signs of interstitial infiltrate versus congestion, will give 1 dose of IV Lasix -Check DVT study -Check echo -May need outpatient sleep  study to rule out sleep apnea  Acute on chronic asthma exacerbation -Continue S ABA -Add Pulmicort  HTN -Continue HCTZ/ACEI  Morbid obesity -Diet, exercise, outpatient PCP follow-up  DVT prophylaxis: Lovenox Code Status: Full code Family Communication: None at bedside Disposition Plan: Expect 3 to 5 days hospital stay to treat COVID-19 pneumonia given patient's comorbidities Consults called: None Admission status: Telemetry admission   Emeline General MD [facility] Pager [phone]  08/01/2020, 6:25 PM

## 2020-08-02 ENCOUNTER — Inpatient Hospital Stay (HOSPITAL_COMMUNITY): Payer: Medicaid Other

## 2020-08-02 DIAGNOSIS — R7989 Other specified abnormal findings of blood chemistry: Secondary | ICD-10-CM

## 2020-08-02 DIAGNOSIS — U071 COVID-19: Secondary | ICD-10-CM

## 2020-08-02 DIAGNOSIS — I5031 Acute diastolic (congestive) heart failure: Secondary | ICD-10-CM

## 2020-08-02 LAB — CBC WITH DIFFERENTIAL/PLATELET
Abs Immature Granulocytes: 0.16 10*3/uL — ABNORMAL HIGH (ref 0.00–0.07)
Basophils Absolute: 0 10*3/uL (ref 0.0–0.1)
Basophils Relative: 1 %
Eosinophils Absolute: 0 10*3/uL (ref 0.0–0.5)
Eosinophils Relative: 0 %
HCT: 39.6 % (ref 36.0–46.0)
Hemoglobin: 12.4 g/dL (ref 12.0–15.0)
Immature Granulocytes: 4 %
Lymphocytes Relative: 25 %
Lymphs Abs: 1 10*3/uL (ref 0.7–4.0)
MCH: 25.6 pg — ABNORMAL LOW (ref 26.0–34.0)
MCHC: 31.3 g/dL (ref 30.0–36.0)
MCV: 81.6 fL (ref 80.0–100.0)
Monocytes Absolute: 0.2 10*3/uL (ref 0.1–1.0)
Monocytes Relative: 5 %
Neutro Abs: 2.5 10*3/uL (ref 1.7–7.7)
Neutrophils Relative %: 65 %
Platelets: 339 10*3/uL (ref 150–400)
RBC: 4.85 MIL/uL (ref 3.87–5.11)
RDW: 16.6 % — ABNORMAL HIGH (ref 11.5–15.5)
WBC: 3.9 10*3/uL — ABNORMAL LOW (ref 4.0–10.5)
nRBC: 0 % (ref 0.0–0.2)

## 2020-08-02 LAB — ECHOCARDIOGRAM LIMITED
Area-P 1/2: 3.27 cm2
P 1/2 time: 480 msec
S' Lateral: 3.1 cm

## 2020-08-02 LAB — FERRITIN: Ferritin: 82 ng/mL (ref 11–307)

## 2020-08-02 LAB — CBG MONITORING, ED: Glucose-Capillary: 126 mg/dL — ABNORMAL HIGH (ref 70–99)

## 2020-08-02 LAB — COMPREHENSIVE METABOLIC PANEL
ALT: 54 U/L — ABNORMAL HIGH (ref 0–44)
AST: 61 U/L — ABNORMAL HIGH (ref 15–41)
Albumin: 2.6 g/dL — ABNORMAL LOW (ref 3.5–5.0)
Alkaline Phosphatase: 54 U/L (ref 38–126)
Anion gap: 11 (ref 5–15)
BUN: 6 mg/dL (ref 6–20)
CO2: 24 mmol/L (ref 22–32)
Calcium: 7.9 mg/dL — ABNORMAL LOW (ref 8.9–10.3)
Chloride: 103 mmol/L (ref 98–111)
Creatinine, Ser: 0.94 mg/dL (ref 0.44–1.00)
GFR calc Af Amer: >60 mL/min (ref >60)
GFR calc non Af Amer: >60 mL/min (ref >60)
Glucose, Bld: 138 mg/dL — ABNORMAL HIGH (ref 70–99)
Potassium: 4.2 mmol/L (ref 3.5–5.1)
Sodium: 138 mmol/L (ref 135–145)
Total Bilirubin: 0.5 mg/dL (ref 0.3–1.2)
Total Protein: 7.1 g/dL (ref 6.5–8.1)

## 2020-08-02 LAB — MAGNESIUM: Magnesium: 2.1 mg/dL (ref 1.7–2.4)

## 2020-08-02 LAB — D-DIMER, QUANTITATIVE: D-Dimer, Quant: 0.71 ug/mL-FEU — ABNORMAL HIGH (ref 0.00–0.50)

## 2020-08-02 LAB — BRAIN NATRIURETIC PEPTIDE: B Natriuretic Peptide: 23.9 pg/mL (ref 0.0–100.0)

## 2020-08-02 LAB — C-REACTIVE PROTEIN: CRP: 8.4 mg/dL — ABNORMAL HIGH (ref <1.0)

## 2020-08-02 LAB — PHOSPHORUS: Phosphorus: 3.4 mg/dL (ref 2.5–4.6)

## 2020-08-02 MED ORDER — HYDRALAZINE HCL 20 MG/ML IJ SOLN: 10.0000 mg | Freq: Four times a day (QID) | INTRAMUSCULAR | Status: DC | PRN

## 2020-08-02 MED ORDER — METHYLPREDNISOLONE SODIUM SUCC 125 MG IJ SOLR
60.0000 mg | Freq: Every day | INTRAMUSCULAR | Status: DC
  Administered 2020-08-03 – 2020-08-05 (×3): 60 mg via INTRAVENOUS
  Filled 2020-08-02 (×3): qty 2

## 2020-08-02 MED ORDER — AMLODIPINE BESYLATE 10 MG PO TABS
10.0000 mg | ORAL_TABLET | Freq: Every day | ORAL | Status: DC
  Administered 2020-08-03 – 2020-08-05 (×3): 10 mg via ORAL
  Filled 2020-08-02 (×3): qty 1

## 2020-08-02 MED ORDER — BUDESONIDE 180 MCG/ACT IN AEPB
1.0000 | INHALATION_SPRAY | Freq: Two times a day (BID) | RESPIRATORY_TRACT | Status: DC
  Administered 2020-08-02 – 2020-08-05 (×6): 1 via RESPIRATORY_TRACT
  Filled 2020-08-02: qty 1

## 2020-08-02 NOTE — Progress Notes (Signed)
  Echocardiogram 2D Echocardiogram has been performed.  Lacey Whitaker 08/02/2020, 11:56 AM

## 2020-08-02 NOTE — ED Notes (Signed)
Pt showed how to use incentive spirometer.  Given toothbrush, etc.

## 2020-08-02 NOTE — Progress Notes (Signed)
PROGRESS NOTE                                                                                                                                                                                                             Patient Demographics:    Lacey Whitaker, is a 34 y.o. female, DOB - 12-Aug-1986, TXM:[id number]  Outpatient Primary MD for the patient is Elmore Guise, FNP    LOS - 1  Admit date - 08/01/2020    Chief Complaint  Patient presents with  . Covid Positive  . Chest Pain       Brief Narrative  Lacey Whitaker is a 34 y.o. female with medical history significant of mild intermittent asthma, HTN, morbid obesity, iron deficiency anemia, presented with increasing short of breath, dry cough and pleuritic chest pain, she came to the ER where she was diagnosed with COVID-19 pneumonia and admitted to the hospital.   Subjective:    Brayton El today has, No headache, No chest pain, No abdominal pain - No Nausea, No new weakness tingling or numbness, mild cough and shortness of breath.   Assessment  & Plan :     1. Acute Hypoxic Resp. Failure due to Acute Covid 19 Viral Pneumonitis during the ongoing 2020 Covid 19 Pandemic - she unfortunately is unvaccinated and has incurred mild to moderate parenchymal lung injury, she has been placed on IV steroids and Remdesivir combination and will be monitored closely.  In case there is clinical deterioration she has consented for Baricitinib use.  Encouraged the patient to sit up in chair in the daytime use I-S and flutter valve for pulmonary toiletry and then prone in bed when at night.  Will advance activity and titrate down oxygen as possible.  Actemra/Baricitinib  off label use - patient was told that if COVID-19 pneumonitis gets worse we might potentially use Actemra off label, patient denies any known history of active diverticulitis, tuberculosis or hepatitis, understands  the risks and benefits and wants to proceed with Actemra treatment if required.     SpO2: 98 % O2 Flow Rate (L/min): 4 L/min  Recent Labs  Lab 08/01/20 1133 08/01/20 1742 08/02/20 0510  WBC 5.8  --  3.9*  PLT 276  --  339  CRP  --  9.3* 8.4*  DDIMER  --  0.73* 0.71*  PROCALCITON  --  <0.10  --   AST  --   --  61*  ALT  --   --  54*  ALKPHOS  --   --  54  BILITOT  --   --  0.5  ALBUMIN  --   --  2.6*  LATICACIDVEN  --  1.0  --       2.  Obesity.  BMI over 35.  Follow with PCP for weight loss.  3.  Mild COVID-19 viral infection related asymptomatic transaminitis.  Stable, monitor trend.  4.  Chronic ankle swelling.  Received 1 dose Lasix and much improved, echo pending.  Also lower extremity venous duplex pending.  5.  History of asthma.  Continue supportive care, currently no wheezing.  6.  Hypertension.  For now Norvasc.   Condition - fair  Family Communication  :  None  Code Status :  Full  Consults  :  None  Procedures  :    Leg [place]  TTE  PUD Prophylaxis : None  Disposition Plan  :    Status is: Inpatient  Remains inpatient appropriate because:IV treatments appropriate due to intensity of illness or inability to take PO   Dispo: The patient is from: Home              Anticipated d/c is to: Home              Anticipated d/c date is: > 3 days              Patient currently is not medically stable to d/c.   DVT Prophylaxis  :  Lovenox    Lab Results  Component Value Date   PLT 339 08/02/2020    Diet :  Diet Order            Diet 2 gram sodium Room service appropriate? Yes; Fluid consistency: Thin  Diet effective now                  Inpatient Medications  Scheduled Meds: . albuterol  2 puff Inhalation Q4H  . baricitinib  4 mg Oral Daily  . budesonide  0.25 mg Inhalation BID  . enoxaparin (LOVENOX) injection  85 mg Subcutaneous Q24H  . guaiFENesin  1,200 mg Oral BID  . hydrochlorothiazide  12.5 mg Oral Daily  . lisinopril  20 mg  Oral Daily  . methylPREDNISolone (SOLU-MEDROL) injection  40 mg Intravenous Q6H  . pantoprazole  40 mg Oral Daily  . Vitamin D (Ergocalciferol)  50,000 Units Oral Q Mon   Continuous Infusions: . remdesivir 100 mg in NS 100 mL     PRN Meds:.acetaminophen, ondansetron **OR** ondansetron (ZOFRAN) IV, tiZANidine  Antibiotics  :    Anti-infectives (From admission, onward)   Start     Dose/Rate Route Frequency Ordered Stop   08/02/20 1000  remdesivir 100 mg in sodium chloride 0.9 % 100 mL IVPB       "Followed by" Linked Group Details   100 mg 200 mL/hr over 30 Minutes Intravenous Daily 08/01/20 1716 08/06/20 0959   08/01/20 1730  remdesivir 200 mg in sodium chloride 0.9% 250 mL IVPB       "Followed by" Linked Group Details   200 mg 580 mL/hr over 30 Minutes Intravenous Once 08/01/20 1716 08/02/20 0106       Time Spent in minutes  30   Susa Raring M.D on 08/02/2020 at 11:34 AM  To page go to [email or web address] - password [facility]  [facility] -  Office  [phone]    See all Orders from today for further details    Objective:   Vitals:   08/02/20 0215 08/02/20 0520 08/02/20 0530 08/02/20 0545  BP: 104/65 140/80 139/85 (!) 141/87  Pulse: 78 74 63 60  Resp: (!) 27 (!) 22 (!) 29 (!) 25  Temp:      TempSrc:      SpO2: 95% 98% 98% 98%    Wt Readings from Last 3 Encounters:  07/24/20 (!) 172.4 kg  03/28/20 (!) 170.4 kg  03/16/20 (!) 170.4 kg    No intake or output data in the 24 hours ending 08/02/20 1134   Physical Exam  Awake Alert, No new F.N deficits, Normal affect [place].AT,PERRAL Supple Neck,No JVD, No cervical lymphadenopathy appriciated.  Symmetrical Chest wall movement, Good air movement bilaterally, CTAB RRR,No Gallops,Rubs or new Murmurs, No Parasternal Heave +ve B.Sounds, Abd Soft, No tenderness, No organomegaly appriciated, No rebound - guarding or rigidity. No Cyanosis, Clubbing or edema, No new Rash or bruise       Data Review:    CBC Recent  Labs  Lab 08/01/20 1133 08/02/20 0510  WBC 5.8 3.9*  HGB 12.0 12.4  HCT 39.3 39.6  PLT 276 339  MCV 81.7 81.6  MCH 24.9* 25.6*  MCHC 30.5 31.3  RDW 16.7* 16.6*  LYMPHSABS  --  1.0  MONOABS  --  0.2  EOSABS  --  0.0  BASOSABS  --  0.0    Recent Labs  Lab 08/01/20 1133 08/01/20 1742 08/02/20 0510  NA 140  --  138  K 3.4*  --  4.2  CL 104  --  103  CO2 26  --  24  GLUCOSE 97  --  138*  BUN <5*  --  6  CREATININE 0.88  --  0.94  CALCIUM 7.9*  --  7.9*  AST  --   --  61*  ALT  --   --  54*  ALKPHOS  --   --  54  BILITOT  --   --  0.5  ALBUMIN  --   --  2.6*  MG  --   --  2.1  CRP  --  9.3* 8.4*  DDIMER  --  0.73* 0.71*  PROCALCITON  --  <0.10  --   LATICACIDVEN  --  1.0  --     ------------------------------------------------------------------------------------------------------------------ Recent Labs    08/01/20 1742  TRIG 133    Lab Results  Component Value Date   HGBA1C 5.9 (H) 01/26/2020   ------------------------------------------------------------------------------------------------------------------ No results for input(s): TSH, T4TOTAL, T3FREE, THYROIDAB in the last 72 hours.  Invalid input(s): FREET3  Cardiac Enzymes No results for input(s): CKMB, TROPONINI, MYOGLOBIN in the last 168 hours.  Invalid input(s): CK ------------------------------------------------------------------------------------------------------------------ No results found for: BNP  Micro Results Recent Results (from the past 240 hour(s))  SARS CORONAVIRUS 2 (TAT 6-24 HRS) Nasopharyngeal Nasopharyngeal Swab     Status: Abnormal   Collection Time: 07/24/20  8:33 PM   Specimen: Nasopharyngeal Swab  Result Value Ref Range Status   SARS Coronavirus 2 POSITIVE (A) NEGATIVE Final    Comment: EMAILED MELISSA B. AT 0430 BY MESSAN H. ON 07/25/2020 (NOTE) SARS-CoV-2 target nucleic acids are DETECTED.  The SARS-CoV-2 RNA is generally detectable in upper and lower respiratory  specimens during the acute phase of infection. Positive results are indicative of the presence of SARS-CoV-2 RNA. Clinical correlation with patient history and other diagnostic information is  necessary to determine patient infection status. Positive results do not rule out bacterial infection or co-infection with other viruses.  The expected result is Negative.  Fact Sheet for Patients: HairSlick.nohttps://www.fda.gov/media/138098/download  Fact Sheet for Healthcare Providers: quierodirigir.comhttps://www.fda.gov/media/138095/download  This test is not yet approved or cleared by the [place] FDA and  has been authorized for detection and/or diagnosis of SARS-CoV-2 by FDA under an Emergency Use Authorization (EUA). This EUA will remain  in effect (meaning this test can be used) for the duration of the COVID-19  declaration under Section 564(b)(1) of the Act, 21 U.S.C. section 360bbb-3(b)(1), unless the authorization is terminated or revoked sooner.   Performed at [facility], [address] [place], [place] [address]     Radiology Reports DG Chest [place]  Result Date: 08/02/2020 CLINICAL DATA:  COVID-19 infection. EXAM: PORTABLE CHEST 1 VIEW COMPARISON:  08/01/2020; 06/25/2019 FINDINGS: Examination is again significantly limited due to patient body habitus. Grossly unchanged cardiac silhouette and mediastinal contours. Minimally improved aeration of the bilateral upper lungs with persistent rather extensive nodular airspace opacities with basilar and peripheral predominance. No definite pleural effusion. No pneumothorax. No definite evidence of edema. No acute osseous abnormalities. IMPRESSION: Minimally improved aeration of the bilateral upper lungs with persistent extensive bilateral nodular airspace opacities again worrisome for multifocal infection compatible with provided history of COVID-19. Electronically Signed   By: Simonne ComeJohn  Watts M.D.   On: 08/02/2020 07:52   DG Chest Portable 1  View  Result Date: 08/01/2020 CLINICAL DATA:  Short of breath, positive COVID EXAM: PORTABLE CHEST 1 VIEW COMPARISON:  Prior chest x-ray 06/25/2019 FINDINGS: Interval development of patchy interstitial and airspace opacities bilaterally. Stable cardiac and mediastinal contours given portable frontal technique. Inspiratory volumes are low. No pneumothorax or pleural effusion. No acute osseous abnormality. IMPRESSION: 1. Developing bilateral interstitial and airspace opacities concerning for COVID pneumonia. 2. In the appropriate clinical setting, pulmonary edema could appear similar. This is considered less likely given patient's age and known COVID positive status. 3. Low inspiratory volumes. Electronically Signed   By: Malachy MoanHeath  McCullough M.D.   On: 08/01/2020 12:03

## 2020-08-02 NOTE — ED Notes (Signed)
Attempted report 

## 2020-08-02 NOTE — Progress Notes (Signed)
Bilateral lower extremity venous duplex completed. Refer to "CV Proc" under chart review to view preliminary results.  08/02/2020 3:18 PM Eula Fried., MHA, RVT, RDCS, RDMS

## 2020-08-03 DIAGNOSIS — I469 Cardiac arrest, cause unspecified: Secondary | ICD-10-CM

## 2020-08-03 DIAGNOSIS — J9601 Acute respiratory failure with hypoxia: Secondary | ICD-10-CM

## 2020-08-03 DIAGNOSIS — U071 COVID-19: Principal | ICD-10-CM

## 2020-08-03 LAB — CBC WITH DIFFERENTIAL/PLATELET
Abs Immature Granulocytes: 0.31 10*3/uL — ABNORMAL HIGH (ref 0.00–0.07)
Basophils Absolute: 0.1 10*3/uL (ref 0.0–0.1)
Basophils Relative: 1 %
Eosinophils Absolute: 0 10*3/uL (ref 0.0–0.5)
Eosinophils Relative: 1 %
HCT: 39.2 % (ref 36.0–46.0)
Hemoglobin: 12.2 g/dL (ref 12.0–15.0)
Immature Granulocytes: 5 %
Lymphocytes Relative: 35 %
Lymphs Abs: 2.2 10*3/uL (ref 0.7–4.0)
MCH: 25.7 pg — ABNORMAL LOW (ref 26.0–34.0)
MCHC: 31.1 g/dL (ref 30.0–36.0)
MCV: 82.5 fL (ref 80.0–100.0)
Monocytes Absolute: 0.6 10*3/uL (ref 0.1–1.0)
Monocytes Relative: 9 %
Neutro Abs: 3.2 10*3/uL (ref 1.7–7.7)
Neutrophils Relative %: 49 %
Platelets: 408 10*3/uL — ABNORMAL HIGH (ref 150–400)
RBC: 4.75 MIL/uL (ref 3.87–5.11)
RDW: 16.6 % — ABNORMAL HIGH (ref 11.5–15.5)
WBC: 6.4 10*3/uL (ref 4.0–10.5)
nRBC: 0 % (ref 0.0–0.2)

## 2020-08-03 LAB — COMPREHENSIVE METABOLIC PANEL
ALT: 51 U/L — ABNORMAL HIGH (ref 0–44)
AST: 60 U/L — ABNORMAL HIGH (ref 15–41)
Albumin: 2.4 g/dL — ABNORMAL LOW (ref 3.5–5.0)
Alkaline Phosphatase: 50 U/L (ref 38–126)
Anion gap: 9 (ref 5–15)
BUN: 8 mg/dL (ref 6–20)
CO2: 29 mmol/L (ref 22–32)
Calcium: 8.3 mg/dL — ABNORMAL LOW (ref 8.9–10.3)
Chloride: 104 mmol/L (ref 98–111)
Creatinine, Ser: 0.82 mg/dL (ref 0.44–1.00)
GFR calc Af Amer: >60 mL/min (ref >60)
GFR calc non Af Amer: >60 mL/min (ref >60)
Glucose, Bld: 101 mg/dL — ABNORMAL HIGH (ref 70–99)
Potassium: 3.7 mmol/L (ref 3.5–5.1)
Sodium: 142 mmol/L (ref 135–145)
Total Bilirubin: 0.5 mg/dL (ref 0.3–1.2)
Total Protein: 6.3 g/dL — ABNORMAL LOW (ref 6.5–8.1)

## 2020-08-03 LAB — C-REACTIVE PROTEIN: CRP: 2.6 mg/dL — ABNORMAL HIGH (ref <1.0)

## 2020-08-03 LAB — MAGNESIUM: Magnesium: 2.4 mg/dL (ref 1.7–2.4)

## 2020-08-03 LAB — D-DIMER, QUANTITATIVE: D-Dimer, Quant: 0.49 ug/mL-FEU (ref 0.00–0.50)

## 2020-08-03 LAB — BRAIN NATRIURETIC PEPTIDE: B Natriuretic Peptide: 14.8 pg/mL (ref 0.0–100.0)

## 2020-08-03 NOTE — Progress Notes (Signed)
PROGRESS NOTE                                                                                                                                                                                                             Patient Demographics:    Lacey Whitaker, is a 34 y.o. female, DOB - 1986-08-26, VHQ:[id number]  Outpatient Primary MD for the patient is Elmore GuiseBates, Crystal A, FNP    LOS - 2  Admit date - 08/01/2020    Chief Complaint  Patient presents with  . Covid Positive  . Chest Pain       Brief Narrative  Lacey Eleirdre Guiffre is a 34 y.o. female with medical history significant of mild intermittent asthma, HTN, morbid obesity, iron deficiency anemia, presented with increasing short of breath, dry cough and pleuritic chest pain, she came to the ER where she was diagnosed with COVID-19 pneumonia and admitted to the hospital.   Subjective:   Patient in bed, appears comfortable, denies any headache, no fever, no chest pain or pressure, improved shortness of breath , no abdominal pain. No focal weakness.    Assessment  & Plan :     1. Acute Hypoxic Resp. Failure due to Acute Covid 19 Viral Pneumonitis during the ongoing 2020 Covid 19 Pandemic - she unfortunately is unvaccinated and has incurred mild to moderate parenchymal lung injury, she has been placed on IV steroids and Remdesivir combination and will be monitored closely.  In case there is clinical deterioration she has consented for Baricitinib use.  Encouraged the patient to sit up in chair in the daytime use I-S and flutter valve for pulmonary toiletry and then prone in bed when at night.  Will advance activity and titrate down oxygen as possible.  Actemra/Baricitinib  off label use - patient was told that if COVID-19 pneumonitis gets worse we might potentially use Actemra off label, patient denies any known history of active diverticulitis, tuberculosis or hepatitis,  understands the risks and benefits and wants to proceed with Actemra treatment if required.     SpO2: 96 % O2 Flow Rate (L/min): 4 L/min  Recent Labs  Lab 08/01/20 1133 08/01/20 1742 08/02/20 0510 08/02/20 0728 08/03/20 0530  WBC 5.8  --  3.9*  --  6.4  PLT 276  --  339  --  408*  CRP  --  9.3* 8.4*  --  2.6*  BNP  --   --   --  23.9 14.8  DDIMER  --  0.73* 0.71*  --  0.49  PROCALCITON  --  <0.10  --   --   --   AST  --   --  61*  --  60*  ALT  --   --  54*  --  51*  ALKPHOS  --   --  54  --  50  BILITOT  --   --  0.5  --  0.5  ALBUMIN  --   --  2.6*  --  2.4*  LATICACIDVEN  --  1.0  --   --   --       2.  Obesity.  BMI over 35.  Follow with PCP for weight loss.  3.  Mild COVID-19 viral infection related asymptomatic transaminitis.  Stable, monitor trend.  4.  Chronic ankle swelling.  Likely related to morbid obesity, much improved after single dose of Lasix, echocardiogram and leg venous ultrasound unremarkable.  5.  History of asthma.  Continue supportive care, currently no wheezing.  6.  Hypertension.  For now Norvasc.  7.  Mild asymptomatic transaminitis due to COVID-19 viral infection, trend.   Condition - fair  Family Communication  :  None  Code Status :  Full  Consults  :  None  Procedures  :    Leg [place] - No DVT  TTE - 1. Left ventricular ejection fraction, by estimation, is 60 to 65%. The left ventricle has normal function. The left ventricle has no regional wall motion abnormalities. There is moderate left ventricular hypertrophy. Left ventricular diastolic  parameters were normal.  2. Right ventricular systolic function is normal. The right ventricular size is normal.  3. The mitral valve is grossly normal. Trivial mitral valve regurgitation.  4. The aortic valve is tricuspid. Aortic valve regurgitation is mild. Mild aortic valve sclerosis is present, with no evidence of aortic valve stenosis. Aortic regurgitation PHT measures 480 msec.  5. The  inferior vena cava is normal in size with <50% respiratory variability, suggesting right atrial pressure of 8 mmHg.    PUD Prophylaxis : PPI  Disposition Plan  :    Status is: Inpatient  Remains inpatient appropriate because:IV treatments appropriate due to intensity of illness or inability to take PO   Dispo: The patient is from: Home              Anticipated d/c is to: Home              Anticipated d/c date is: > 3 days              Patient currently is not medically stable to d/c.   DVT Prophylaxis  :  Lovenox    Lab Results  Component Value Date   PLT 408 (H) 08/03/2020    Diet :  Diet Order            Diet 2 gram sodium Room service appropriate? Yes; Fluid consistency: Thin  Diet effective now                  Inpatient Medications  Scheduled Meds: . albuterol  2 puff Inhalation Q4H  . amLODipine  10 mg Oral Daily  . baricitinib  4 mg Oral Daily  . budesonide  1 puff Inhalation BID  . enoxaparin (LOVENOX) injection  85 mg Subcutaneous Q24H  . guaiFENesin  1,200  mg Oral BID  . methylPREDNISolone (SOLU-MEDROL) injection  60 mg Intravenous Daily  . pantoprazole  40 mg Oral Daily  . Vitamin D (Ergocalciferol)  50,000 Units Oral Q Mon   Continuous Infusions: . remdesivir 100 mg in NS 100 mL 100 mg (08/03/20 1005)   PRN Meds:.acetaminophen, hydrALAZINE, ondansetron **OR** ondansetron (ZOFRAN) IV, tiZANidine  Antibiotics  :    Anti-infectives (From admission, onward)   Start     Dose/Rate Route Frequency Ordered Stop   08/02/20 1000  remdesivir 100 mg in sodium chloride 0.9 % 100 mL IVPB       "Followed by" Linked Group Details   100 mg 200 mL/hr over 30 Minutes Intravenous Daily 08/01/20 1716 08/06/20 0959   08/01/20 1730  remdesivir 200 mg in sodium chloride 0.9% 250 mL IVPB       "Followed by" Linked Group Details   200 mg 580 mL/hr over 30 Minutes Intravenous Once 08/01/20 1716 08/02/20 0106       Time Spent in minutes  30   Susa Raring M.D  on 08/03/2020 at 10:44 AM  To page go to [email or web address] - password [facility]  [facility] -  Office  [phone]    See all Orders from today for further details    Objective:   Vitals:   08/02/20 2019 08/03/20 0103 08/03/20 0515 08/03/20 0734  BP: 130/66 118/77 122/74 125/65  Pulse: 79 70 66 65  Resp: (!) 22 (!) 25 (!) 24 20  Temp: 98.3 F (36.8 C) 99.3 F (37.4 C) 98.5 F (36.9 C) 97.6 F (36.4 C)  TempSrc: Oral Oral Oral Oral  SpO2: 96% 93% 97% 96%    Wt Readings from Last 3 Encounters:  07/24/20 (!) 172.4 kg  03/28/20 (!) 170.4 kg  03/16/20 (!) 170.4 kg    No intake or output data in the 24 hours ending 08/03/20 1044   Physical Exam  Awake Alert, No new F.N deficits, Normal affect [place].AT,PERRAL Supple Neck,No JVD, No cervical lymphadenopathy appriciated.  Symmetrical Chest wall movement, Good air movement bilaterally, CTAB RRR,No Gallops, Rubs or new Murmurs, No Parasternal Heave +ve B.Sounds, Abd Soft, No tenderness, No organomegaly appriciated, No rebound - guarding or rigidity. No Cyanosis, Clubbing or edema, No new Rash or bruise     Data Review:    CBC Recent Labs  Lab 08/01/20 1133 08/02/20 0510 08/03/20 0530  WBC 5.8 3.9* 6.4  HGB 12.0 12.4 12.2  HCT 39.3 39.6 39.2  PLT 276 339 408*  MCV 81.7 81.6 82.5  MCH 24.9* 25.6* 25.7*  MCHC 30.5 31.3 31.1  RDW 16.7* 16.6* 16.6*  LYMPHSABS  --  1.0 2.2  MONOABS  --  0.2 0.6  EOSABS  --  0.0 0.0  BASOSABS  --  0.0 0.1    Recent Labs  Lab 08/01/20 1133 08/01/20 1742 08/02/20 0510 08/02/20 0728 08/03/20 0530  NA 140  --  138  --  142  K 3.4*  --  4.2  --  3.7  CL 104  --  103  --  104  CO2 26  --  24  --  29  GLUCOSE 97  --  138*  --  101*  BUN <5*  --  6  --  8  CREATININE 0.88  --  0.94  --  0.82  CALCIUM 7.9*  --  7.9*  --  8.3*  AST  --   --  61*  --  60*  ALT  --   --  54*  --  51*  ALKPHOS  --   --  54  --  50  BILITOT  --   --  0.5  --  0.5  ALBUMIN  --   --  2.6*  --  2.4*    MG  --   --  2.1  --  2.4  CRP  --  9.3* 8.4*  --  2.6*  DDIMER  --  0.73* 0.71*  --  0.49  PROCALCITON  --  <0.10  --   --   --   LATICACIDVEN  --  1.0  --   --   --   BNP  --   --   --  23.9 14.8    ------------------------------------------------------------------------------------------------------------------ Recent Labs    08/01/20 1742  TRIG 133    Lab Results  Component Value Date   HGBA1C 5.9 (H) 01/26/2020   ------------------------------------------------------------------------------------------------------------------ No results for input(s): TSH, T4TOTAL, T3FREE, THYROIDAB in the last 72 hours.  Invalid input(s): FREET3  Cardiac Enzymes No results for input(s): CKMB, TROPONINI, MYOGLOBIN in the last 168 hours.  Invalid input(s): CK ------------------------------------------------------------------------------------------------------------------    Component Value Date/Time   BNP 14.8 08/03/2020 0530    Micro Results Recent Results (from the past 240 hour(s))  SARS CORONAVIRUS 2 (TAT 6-24 HRS) Nasopharyngeal Nasopharyngeal Swab     Status: Abnormal   Collection Time: 07/24/20  8:33 PM   Specimen: Nasopharyngeal Swab  Result Value Ref Range Status   SARS Coronavirus 2 POSITIVE (A) NEGATIVE Final    Comment: EMAILED MELISSA B. AT 0430 BY MESSAN H. ON 07/25/2020 (NOTE) SARS-CoV-2 target nucleic acids are DETECTED.  The SARS-CoV-2 RNA is generally detectable in upper and lower respiratory specimens during the acute phase of infection. Positive results are indicative of the presence of SARS-CoV-2 RNA. Clinical correlation with patient history and other diagnostic information is  necessary to determine patient infection status. Positive results do not rule out bacterial infection or co-infection with other viruses.  The expected result is Negative.  Fact Sheet for Patients: HairSlick.no  Fact Sheet for Healthcare  Providers: quierodirigir.com  This test is not yet approved or cleared by the [place] FDA and  has been authorized for detection and/or diagnosis of SARS-CoV-2 by FDA under an Emergency Use Authorization (EUA). This EUA will remain  in effect (meaning this test can be used) for the duration of the COVID-19  declaration under Section 564(b)(1) of the Act, 21 U.S.C. section 360bbb-3(b)(1), unless the authorization is terminated or revoked sooner.   Performed at [facility], [address], [place], [place] [address]   Blood Culture (routine x 2)     Status: None (Preliminary result)   Collection Time: 08/01/20  5:42 PM   Specimen: BLOOD  Result Value Ref Range Status   Specimen Description BLOOD RIGHT ANTECUBITAL  Final   Special Requests   Final    BOTTLES DRAWN AEROBIC AND ANAEROBIC Blood Culture adequate volume   Culture   Final    NO GROWTH 2 DAYS Performed at [facility], [address], [place], [place] [address]    Report Status PENDING  Incomplete  Blood Culture (routine x 2)     Status: None (Preliminary result)   Collection Time: 08/01/20  6:05 PM   Specimen: BLOOD RIGHT WRIST  Result Value Ref Range Status   Specimen Description BLOOD RIGHT WRIST  Final   Special Requests   Final    BOTTLES DRAWN AEROBIC AND  ANAEROBIC Blood Culture adequate volume   Culture   Final    NO GROWTH 2 DAYS Performed at [facility], [address], [place], [place] [address]    Report Status PENDING  Incomplete    Radiology Reports DG Chest Port 1 View  Result Date: 08/02/2020 CLINICAL DATA:  COVID-19 infection. EXAM: PORTABLE CHEST 1 VIEW COMPARISON:  08/01/2020; 06/25/2019 FINDINGS: Examination is again significantly limited due to patient body habitus. Grossly unchanged cardiac silhouette and mediastinal contours. Minimally improved aeration of the bilateral upper lungs with persistent rather extensive nodular airspace opacities with  basilar and peripheral predominance. No definite pleural effusion. No pneumothorax. No definite evidence of edema. No acute osseous abnormalities. IMPRESSION: Minimally improved aeration of the bilateral upper lungs with persistent extensive bilateral nodular airspace opacities again worrisome for multifocal infection compatible with provided history of COVID-19. Electronically Signed   By: Simonne Come M.D.   On: 08/02/2020 07:52   DG Chest Portable 1 View  Result Date: 08/01/2020 CLINICAL DATA:  Short of breath, positive COVID EXAM: PORTABLE CHEST 1 VIEW COMPARISON:  Prior chest x-ray 06/25/2019 FINDINGS: Interval development of patchy interstitial and airspace opacities bilaterally. Stable cardiac and mediastinal contours given portable frontal technique. Inspiratory volumes are low. No pneumothorax or pleural effusion. No acute osseous abnormality. IMPRESSION: 1. Developing bilateral interstitial and airspace opacities concerning for COVID pneumonia. 2. In the appropriate clinical setting, pulmonary edema could appear similar. This is considered less likely given patient's age and known COVID positive status. 3. Low inspiratory volumes. Electronically Signed   By: Malachy Moan M.D.   On: 08/01/2020 12:03   VAS [place] LOWER EXTREMITY VENOUS (DVT)  Result Date: 08/02/2020  Lower Venous DVTStudy Indications: COVID-19 positive, d-dimer 0.7.  Limitations: Body habitus, poor ultrasound/tissue interface and poor patient cooperation; patient unable to tolerate compression maneuvers. Comparison Study: No prior study Performing Technologist: Gertie Fey MHA, RDMS, RVT, RDCS  Examination Guidelines: A complete evaluation includes B-mode imaging, spectral Doppler, color Doppler, and power Doppler as needed of all accessible portions of each vessel. Bilateral testing is considered an integral part of a complete examination. Limited examinations for reoccurring indications may be performed as noted. The reflux  portion of the exam is performed with the patient in reverse Trendelenburg.  +---------+---------------+---------+-----------+----------+--------------+ RIGHT    CompressibilityPhasicitySpontaneityPropertiesThrombus Aging +---------+---------------+---------+-----------+----------+--------------+ CFV      Full           Yes      Yes        Patent                   +---------+---------------+---------+-----------+----------+--------------+ FV Prox  Full                               Patent                   +---------+---------------+---------+-----------+----------+--------------+ FV Mid                           Yes        Patent                   +---------+---------------+---------+-----------+----------+--------------+ FV Distal                        Yes        Patent                   +---------+---------------+---------+-----------+----------+--------------+  POP                              Yes        Patent                   +---------+---------------+---------+-----------+----------+--------------+ PTV      Full                               Patent                   +---------+---------------+---------+-----------+----------+--------------+ PERO     Full                               Patent                   +---------+---------------+---------+-----------+----------+--------------+   Right Technical Findings: Not visualized segments include SFJ, PFV,.  +---------+---------------+---------+-----------+----------+--------------+ LEFT     CompressibilityPhasicitySpontaneityPropertiesThrombus Aging +---------+---------------+---------+-----------+----------+--------------+ CFV                     Yes      Yes        Patent                   +---------+---------------+---------+-----------+----------+--------------+ FV Prox                          Yes        Patent                    +---------+---------------+---------+-----------+----------+--------------+ FV Mid                           Yes        Patent                   +---------+---------------+---------+-----------+----------+--------------+ FV Distal                        Yes        Patent                   +---------+---------------+---------+-----------+----------+--------------+ POP                     Yes      Yes        Patent                   +---------+---------------+---------+-----------+----------+--------------+ PTV                              Yes        Patent                   +---------+---------------+---------+-----------+----------+--------------+ PERO                             Yes        Patent                   +---------+---------------+---------+-----------+----------+--------------+   Left Technical Findings: Not visualized segments include SFJ, PFV,.   Summary: BILATERAL: -No evidence of  popliteal cyst, bilaterally. RIGHT: - There is no evidence of deep vein thrombosis in the lower extremity. However, portions of this examination were limited- see technologist comments above.  LEFT: - There is no evidence of deep vein thrombosis in the lower extremity. However, portions of this examination were limited- see technologist comments above.  *See table(s) above for measurements and observations.    Preliminary    ECHOCARDIOGRAM LIMITED  Result Date: 08/02/2020    ECHOCARDIOGRAM REPORT   Patient Name:   FLORIDA NOLTON Date of Exam: 08/02/2020 Medical Rec #:  [id number]       Height:       67.0 in Accession #:    [id number]      Weight:       380.0 lb Date of Birth:  20-Dec-1985       BSA:          2.656 m Patient Age:    34 years        BP:           146/84 mmHg Patient Gender: F               HR:           55 bpm. Exam Location:  Inpatient Procedure: Limited Echo, Cardiac Doppler and Color Doppler Indications:    CHF-Acute Diastolic  History:        Patient has no prior  history of Echocardiogram examinations.                 Risk Factors:Hypertension, Diabetes and Current Smoker.                 COVID-19.  Sonographer:    Ross Ludwig RDCS (AE) Referring Phys: [id number] [place] [facility]  Sonographer Comments: No subcostal window and patient is morbidly obese. Image acquisition challenging due to patient body habitus. COVID-19 IMPRESSIONS  1. Left ventricular ejection fraction, by estimation, is 60 to 65%. The left ventricle has normal function. The left ventricle has no regional wall motion abnormalities. There is moderate left ventricular hypertrophy. Left ventricular diastolic parameters were normal.  2. Right ventricular systolic function is normal. The right ventricular size is normal.  3. The mitral valve is grossly normal. Trivial mitral valve regurgitation.  4. The aortic valve is tricuspid. Aortic valve regurgitation is mild. Mild aortic valve sclerosis is present, with no evidence of aortic valve stenosis. Aortic regurgitation PHT measures 480 msec.  5. The inferior vena cava is normal in size with <50% respiratory variability, suggesting right atrial pressure of 8 mmHg. Comparison(s): No prior Echocardiogram. FINDINGS  Left Ventricle: Left ventricular ejection fraction, by estimation, is 60 to 65%. The left ventricle has normal function. The left ventricle has no regional wall motion abnormalities. The left ventricular internal cavity size was normal in size. There is  moderate left ventricular hypertrophy. Left ventricular diastolic parameters were normal. Indeterminate filling pressures. Right Ventricle: The right ventricular size is normal. No increase in right ventricular wall thickness. Right ventricular systolic function is normal. Left Atrium: Left atrial size was normal in size. Right Atrium: Right atrial size was normal in size. Pericardium: There is no evidence of pericardial effusion. Mitral Valve: The mitral valve is grossly normal. Trivial mitral valve regurgitation.  Tricuspid Valve: The tricuspid valve is grossly normal. Tricuspid valve regurgitation is trivial. Aortic Valve: The aortic valve is tricuspid. Aortic valve regurgitation is mild. Aortic regurgitation PHT measures 480 msec. Mild aortic valve sclerosis is present, with no evidence of  aortic valve stenosis. Pulmonic Valve: The pulmonic valve was normal in structure. Pulmonic valve regurgitation is not visualized. Aorta: The aortic root and ascending aorta are structurally normal, with no evidence of dilitation. Venous: The inferior vena cava is normal in size with less than 50% respiratory variability, suggesting right atrial pressure of 8 mmHg. IAS/Shunts: No atrial level shunt detected by color flow Doppler.  LEFT VENTRICLE PLAX 2D LVIDd:         4.70 cm  Diastology LVIDs:         3.10 cm  LV e' medial:    9.03 cm/s LV PW:         1.50 cm  LV E/e' medial:  10.8 LV IVS:        1.60 cm  LV e' lateral:   9.25 cm/s LVOT diam:     2.00 cm  LV E/e' lateral: 10.5 LVOT Area:     3.14 cm  IVC IVC diam: 1.70 cm LEFT ATRIUM         Index LA diam:    3.20 cm 1.20 cm/m  AORTIC VALVE AI PHT:      480 msec  AORTA Ao Root diam: 3.10 cm MITRAL VALVE MV Area (PHT): 3.27 cm    SHUNTS MV Decel Time: 232 msec    Systemic Diam: 2.00 cm MV E velocity: 97.30 cm/s MV A velocity: 58.30 cm/s MV E/A ratio:  1.67 Zoila Shutter MD Electronically signed by Zoila Shutter MD Signature Date/Time: 08/02/2020/12:35:41 PM    Final

## 2020-08-03 NOTE — TOC Initial Note (Signed)
Transition of Care [facility]) - Initial/Assessment Note    Patient Details  Name: Lacey Whitaker MRN: [id number] Date of Birth: [birth year]  Transition of Care [facility]) CM/SW Contact:    Lockie Pares, RN Phone Number: 08/03/2020, 9:56 AM  Clinical Narrative:                 Patient admitted for OCVID pneumonia, on oxygen currently day 1. Receiving IV treatment. Not insured, no priamry PCP. Will assign to [facility] after initial post COVID clinic appointment. Is employed, may need oxygen  Post hospitalization. CM will follow for needs.  Expected Discharge Plan: Home/Self Care Barriers to Discharge: Continued Medical Work up   Patient Goals and CMS Choice        Expected Discharge Plan and Services Expected Discharge Plan: Home/Self Care   Discharge Planning Services: CM Consult   Living arrangements for the past 2 months: Single Family Home                                      Prior Living Arrangements/Services Living arrangements for the past 2 months: Single Family Home   Patient language and need for interpreter reviewed:: Yes        Need for Family Participation in Patient Care: Yes (Comment) Care giver support system in place?: Yes (comment)   Criminal Activity/Legal Involvement Pertinent to Current Situation/Hospitalization: No - Comment as needed  Activities of Daily Living      Permission Sought/Granted                  Emotional Assessment       Orientation: : Oriented to Self, Oriented to Place, Oriented to  Time, Oriented to Situation Alcohol / Substance Use: Not Applicable Psych Involvement: No (comment)  Admission diagnosis:  Cardiac arrest (HCC) [I46.9] CHF (congestive heart failure) (HCC) [I50.9] Acute respiratory failure with hypoxia (HCC) [J96.01] COVID-19 [U07.1] Patient Active Problem List   Diagnosis Date Noted  . COVID-19 08/01/2020  . Acute respiratory failure with hypoxia (HCC)   . Diabetes mellitus type II, controlled (HCC)  01/27/2020  . Asthma, chronic   . HTN (hypertension)   . Iron deficiency anemia due to chronic blood loss   . Obesities, morbid (HCC) 08/10/2011   PCP:  Elmore Guise, FNP Pharmacy:   [facility] [phone] - [place], [place] - [address] AT [facility] [address] [place] [address] Phone: [phone] Fax: [phone]     Social Determinants of Health (SDOH) Interventions    Readmission Risk Interventions No flowsheet data found.

## 2020-08-04 DIAGNOSIS — I469 Cardiac arrest, cause unspecified: Secondary | ICD-10-CM

## 2020-08-04 DIAGNOSIS — J9601 Acute respiratory failure with hypoxia: Secondary | ICD-10-CM

## 2020-08-04 DIAGNOSIS — U071 COVID-19: Principal | ICD-10-CM

## 2020-08-04 LAB — CBC WITH DIFFERENTIAL/PLATELET
Abs Immature Granulocytes: 0.42 10*3/uL — ABNORMAL HIGH (ref 0.00–0.07)
Basophils Absolute: 0 10*3/uL (ref 0.0–0.1)
Basophils Relative: 0 %
Eosinophils Absolute: 0 10*3/uL (ref 0.0–0.5)
Eosinophils Relative: 0 %
HCT: 39.1 % (ref 36.0–46.0)
Hemoglobin: 12.2 g/dL (ref 12.0–15.0)
Immature Granulocytes: 4 %
Lymphocytes Relative: 24 %
Lymphs Abs: 2.4 10*3/uL (ref 0.7–4.0)
MCH: 25.6 pg — ABNORMAL LOW (ref 26.0–34.0)
MCHC: 31.2 g/dL (ref 30.0–36.0)
MCV: 82.1 fL (ref 80.0–100.0)
Monocytes Absolute: 0.9 10*3/uL (ref 0.1–1.0)
Monocytes Relative: 9 %
Neutro Abs: 6 10*3/uL (ref 1.7–7.7)
Neutrophils Relative %: 63 %
Platelets: 482 10*3/uL — ABNORMAL HIGH (ref 150–400)
RBC: 4.76 MIL/uL (ref 3.87–5.11)
RDW: 16.5 % — ABNORMAL HIGH (ref 11.5–15.5)
WBC: 9.7 10*3/uL (ref 4.0–10.5)
nRBC: 0 % (ref 0.0–0.2)

## 2020-08-04 LAB — MAGNESIUM: Magnesium: 2.2 mg/dL (ref 1.7–2.4)

## 2020-08-04 LAB — COMPREHENSIVE METABOLIC PANEL
ALT: 60 U/L — ABNORMAL HIGH (ref 0–44)
AST: 51 U/L — ABNORMAL HIGH (ref 15–41)
Albumin: 2.6 g/dL — ABNORMAL LOW (ref 3.5–5.0)
Alkaline Phosphatase: 51 U/L (ref 38–126)
Anion gap: 7 (ref 5–15)
BUN: 9 mg/dL (ref 6–20)
CO2: 28 mmol/L (ref 22–32)
Calcium: 8.3 mg/dL — ABNORMAL LOW (ref 8.9–10.3)
Chloride: 103 mmol/L (ref 98–111)
Creatinine, Ser: 0.81 mg/dL (ref 0.44–1.00)
GFR calc Af Amer: >60 mL/min (ref >60)
GFR calc non Af Amer: >60 mL/min (ref >60)
Glucose, Bld: 100 mg/dL — ABNORMAL HIGH (ref 70–99)
Potassium: 3.7 mmol/L (ref 3.5–5.1)
Sodium: 138 mmol/L (ref 135–145)
Total Bilirubin: 0.4 mg/dL (ref 0.3–1.2)
Total Protein: 6.6 g/dL (ref 6.5–8.1)

## 2020-08-04 LAB — D-DIMER, QUANTITATIVE: D-Dimer, Quant: 0.4 ug/mL-FEU (ref 0.00–0.50)

## 2020-08-04 LAB — C-REACTIVE PROTEIN: CRP: 0.9 mg/dL (ref <1.0)

## 2020-08-04 LAB — BRAIN NATRIURETIC PEPTIDE: B Natriuretic Peptide: 25.1 pg/mL (ref 0.0–100.0)

## 2020-08-04 NOTE — Progress Notes (Signed)
PROGRESS NOTE                                                                                                                                                                                                             Patient Demographics:    Lacey Whitaker, is a 34 y.o. female, DOB - 12-Jun-1986, WUJ:[id number]  Outpatient Primary MD for the patient is Elmore Guise, FNP    LOS - 3  Admit date - 08/01/2020    Chief Complaint  Patient presents with  . Covid Positive  . Chest Pain       Brief Narrative  Lacey Whitaker is a 34 y.o. female with medical history significant of mild intermittent asthma, HTN, morbid obesity, iron deficiency anemia, presented with increasing short of breath, dry cough and pleuritic chest pain, she came to the ER where she was diagnosed with COVID-19 pneumonia and admitted to the hospital.   Subjective:   Patient in bed, appears comfortable, denies any headache, no fever, no chest pain or pressure, improved shortness of breath , no abdominal pain. No focal weakness.    Assessment  & Plan :     1. Acute Hypoxic Resp. Failure due to Acute Covid 19 Viral Pneumonitis during the ongoing 2020 Covid 19 Pandemic - she unfortunately is unvaccinated and has incurred mild to moderate parenchymal lung injury, has been placed on combination of IV steroids, remdesivir and Baricitinib with stabilization of her disease and now gradually improving.  Currently on 2 L nasal cannula and feeling better.  Continue to monitor.  Advance activity and titrate down oxygen.   Encouraged the patient to sit up in chair in the daytime use I-S and flutter valve for pulmonary toiletry and then prone in bed when at night.  Will advance activity and titrate down oxygen as possible.      Recent Labs  Lab 08/01/20 1133 08/01/20 1742 08/02/20 0510 08/02/20 0728 08/03/20 0530 08/04/20 0755  WBC 5.8  --  3.9*  --  6.4 9.7    PLT 276  --  339  --  408* 482*  CRP  --  9.3* 8.4*  --  2.6* 0.9  BNP  --   --   --  23.9 14.8 25.1  DDIMER  --  0.73* 0.71*  --  0.49 0.40  PROCALCITON  --  <  0.10  --   --   --   --   AST  --   --  61*  --  60* 51*  ALT  --   --  54*  --  51* 60*  ALKPHOS  --   --  54  --  50 51  BILITOT  --   --  0.5  --  0.5 0.4  ALBUMIN  --   --  2.6*  --  2.4* 2.6*  LATICACIDVEN  --  1.0  --   --   --   --       2.  Obesity with likely undiagnosed OSA can OSA.  BMI over 35.  Follow with PCP for weight loss.  Will benefit from outpatient sleep study will defer this to PCP.  3.  Mild COVID-19 viral infection related asymptomatic transaminitis.  Stable, monitor trend.  4.  Chronic ankle swelling.  Likely related to morbid obesity, much improved after single dose of Lasix, echocardiogram and leg venous ultrasound unremarkable.  5.  History of asthma.  Continue supportive care, currently no wheezing.  6.  Hypertension.  For now Norvasc.  7.  Mild asymptomatic transaminitis due to COVID-19 viral infection, trend.   Condition - fair  Family Communication  :  None  Code Status :  Full  Consults  :  None  Procedures  :    Leg [place] - No DVT  TTE - 1. Left ventricular ejection fraction, by estimation, is 60 to 65%. The left ventricle has normal function. The left ventricle has no regional wall motion abnormalities. There is moderate left ventricular hypertrophy. Left ventricular diastolic  parameters were normal.  2. Right ventricular systolic function is normal. The right ventricular size is normal.  3. The mitral valve is grossly normal. Trivial mitral valve regurgitation.  4. The aortic valve is tricuspid. Aortic valve regurgitation is mild. Mild aortic valve sclerosis is present, with no evidence of aortic valve stenosis. Aortic regurgitation PHT measures 480 msec.  5. The inferior vena cava is normal in size with <50% respiratory variability, suggesting right atrial pressure of 8 mmHg.     PUD Prophylaxis : PPI  Disposition Plan  :    Status is: Inpatient  Remains inpatient appropriate because:IV treatments appropriate due to intensity of illness or inability to take PO   Dispo: The patient is from: Home              Anticipated d/c is to: Home              Anticipated d/c date is: > 3 days              Patient currently is not medically stable to d/c.   DVT Prophylaxis  :  Lovenox    Lab Results  Component Value Date   PLT 482 (H) 08/04/2020    Diet :  Diet Order            Diet 2 gram sodium Room service appropriate? Yes; Fluid consistency: Thin  Diet effective now                  Inpatient Medications  Scheduled Meds: . albuterol  2 puff Inhalation Q4H  . amLODipine  10 mg Oral Daily  . baricitinib  4 mg Oral Daily  . budesonide  1 puff Inhalation BID  . enoxaparin (LOVENOX) injection  85 mg Subcutaneous Q24H  . guaiFENesin  1,200 mg Oral BID  . methylPREDNISolone (  SOLU-MEDROL) injection  60 mg Intravenous Daily  . pantoprazole  40 mg Oral Daily  . Vitamin D (Ergocalciferol)  50,000 Units Oral Q Mon   Continuous Infusions: . remdesivir 100 mg in NS 100 mL 100 mg (08/04/20 0911)   PRN Meds:.acetaminophen, hydrALAZINE, ondansetron **OR** ondansetron (ZOFRAN) IV, tiZANidine  Antibiotics  :    Anti-infectives (From admission, onward)   Start     Dose/Rate Route Frequency Ordered Stop   08/02/20 1000  remdesivir 100 mg in sodium chloride 0.9 % 100 mL IVPB       "Followed by" Linked Group Details   100 mg 200 mL/hr over 30 Minutes Intravenous Daily 08/01/20 1716 08/06/20 0959   08/01/20 1730  remdesivir 200 mg in sodium chloride 0.9% 250 mL IVPB       "Followed by" Linked Group Details   200 mg 580 mL/hr over 30 Minutes Intravenous Once 08/01/20 1716 08/02/20 0106       Time Spent in minutes  30   Susa RaringPrashant Javarie Crisp M.D on 08/04/2020 at 11:22 AM  To page go to [email or web address] - password [facility]  [facility] -  Office   [phone]    See all Orders from today for further details    Objective:   Vitals:   08/04/20 0022 08/04/20 0346 08/04/20 0722 08/04/20 0906  BP: 123/65 123/81 134/72 114/60  Pulse: 70 68 66   Resp: (!) 27 18 19    Temp: 97.9 F (36.6 C) 97.6 F (36.4 C) 98 F (36.7 C)   TempSrc: Axillary Axillary Oral   SpO2: 95% 95% 96%     Wt Readings from Last 3 Encounters:  07/24/20 (!) 172.4 kg  03/28/20 (!) 170.4 kg  03/16/20 (!) 170.4 kg     Intake/Output Summary (Last 24 hours) at 08/04/2020 1122 Last data filed at 08/04/2020 1012 Gross per 24 hour  Intake 480 ml  Output --  Net 480 ml     Physical Exam  Awake Alert, No new F.N deficits, Normal affect [place].AT,PERRAL Supple Neck,No JVD, No cervical lymphadenopathy appriciated.  Symmetrical Chest wall movement, Good air movement bilaterally, CTAB RRR,No Gallops, Rubs or new Murmurs, No Parasternal Heave +ve B.Sounds, Abd Soft, No tenderness, No organomegaly appriciated, No rebound - guarding or rigidity. No Cyanosis, Clubbing or edema, No new Rash or bruise    Data Review:    CBC Recent Labs  Lab 08/01/20 1133 08/02/20 0510 08/03/20 0530 08/04/20 0755  WBC 5.8 3.9* 6.4 9.7  HGB 12.0 12.4 12.2 12.2  HCT 39.3 39.6 39.2 39.1  PLT 276 339 408* 482*  MCV 81.7 81.6 82.5 82.1  MCH 24.9* 25.6* 25.7* 25.6*  MCHC 30.5 31.3 31.1 31.2  RDW 16.7* 16.6* 16.6* 16.5*  LYMPHSABS  --  1.0 2.2 2.4  MONOABS  --  0.2 0.6 0.9  EOSABS  --  0.0 0.0 0.0  BASOSABS  --  0.0 0.1 0.0    Recent Labs  Lab 08/01/20 1133 08/01/20 1742 08/02/20 0510 08/02/20 0728 08/03/20 0530 08/04/20 0755  NA 140  --  138  --  142 138  K 3.4*  --  4.2  --  3.7 3.7  CL 104  --  103  --  104 103  CO2 26  --  24  --  29 28  GLUCOSE 97  --  138*  --  101* 100*  BUN <5*  --  6  --  8 9  CREATININE 0.88  --  0.94  --  0.82  0.81  CALCIUM 7.9*  --  7.9*  --  8.3* 8.3*  AST  --   --  61*  --  60* 51*  ALT  --   --  54*  --  51* 60*  ALKPHOS  --    --  54  --  50 51  BILITOT  --   --  0.5  --  0.5 0.4  ALBUMIN  --   --  2.6*  --  2.4* 2.6*  MG  --   --  2.1  --  2.4 2.2  CRP  --  9.3* 8.4*  --  2.6* 0.9  DDIMER  --  0.73* 0.71*  --  0.49 0.40  PROCALCITON  --  <0.10  --   --   --   --   LATICACIDVEN  --  1.0  --   --   --   --   BNP  --   --   --  23.9 14.8 25.1    ------------------------------------------------------------------------------------------------------------------ Recent Labs    08/01/20 1742  TRIG 133    Lab Results  Component Value Date   HGBA1C 5.9 (H) 01/26/2020   ------------------------------------------------------------------------------------------------------------------ No results for input(s): TSH, T4TOTAL, T3FREE, THYROIDAB in the last 72 hours.  Invalid input(s): FREET3  Cardiac Enzymes No results for input(s): CKMB, TROPONINI, MYOGLOBIN in the last 168 hours.  Invalid input(s): CK ------------------------------------------------------------------------------------------------------------------    Component Value Date/Time   BNP 25.1 08/04/2020 0755    Micro Results Recent Results (from the past 240 hour(s))  Blood Culture (routine x 2)     Status: None (Preliminary result)   Collection Time: 08/01/20  5:42 PM   Specimen: BLOOD  Result Value Ref Range Status   Specimen Description BLOOD RIGHT ANTECUBITAL  Final   Special Requests   Final    BOTTLES DRAWN AEROBIC AND ANAEROBIC Blood Culture adequate volume   Culture   Final    NO GROWTH 2 DAYS Performed at [facility], [address], [place], [place] [address]    Report Status PENDING  Incomplete  Blood Culture (routine x 2)     Status: None (Preliminary result)   Collection Time: 08/01/20  6:05 PM   Specimen: BLOOD RIGHT WRIST  Result Value Ref Range Status   Specimen Description BLOOD RIGHT WRIST  Final   Special Requests   Final    BOTTLES DRAWN AEROBIC AND ANAEROBIC Blood Culture adequate volume   Culture   Final     NO GROWTH 2 DAYS Performed at [facility], [address] [place], [place] [address]    Report Status PENDING  Incomplete    Radiology Reports DG Chest Port 1 View  Result Date: 08/02/2020 CLINICAL DATA:  COVID-19 infection. EXAM: PORTABLE CHEST 1 VIEW COMPARISON:  08/01/2020; 06/25/2019 FINDINGS: Examination is again significantly limited due to patient body habitus. Grossly unchanged cardiac silhouette and mediastinal contours. Minimally improved aeration of the bilateral upper lungs with persistent rather extensive nodular airspace opacities with basilar and peripheral predominance. No definite pleural effusion. No pneumothorax. No definite evidence of edema. No acute osseous abnormalities. IMPRESSION: Minimally improved aeration of the bilateral upper lungs with persistent extensive bilateral nodular airspace opacities again worrisome for multifocal infection compatible with provided history of COVID-19. Electronically Signed   By: Simonne Come M.D.   On: 08/02/2020 07:52   DG Chest Portable 1 View  Result Date: 08/01/2020 CLINICAL DATA:  Short of breath, positive COVID EXAM: PORTABLE CHEST  1 VIEW COMPARISON:  Prior chest x-ray 06/25/2019 FINDINGS: Interval development of patchy interstitial and airspace opacities bilaterally. Stable cardiac and mediastinal contours given portable frontal technique. Inspiratory volumes are low. No pneumothorax or pleural effusion. No acute osseous abnormality. IMPRESSION: 1. Developing bilateral interstitial and airspace opacities concerning for COVID pneumonia. 2. In the appropriate clinical setting, pulmonary edema could appear similar. This is considered less likely given patient's age and known COVID positive status. 3. Low inspiratory volumes. Electronically Signed   By: Malachy Moan M.D.   On: 08/01/2020 12:03   VAS [place] LOWER EXTREMITY VENOUS (DVT)  Result Date: 08/03/2020  Lower Venous DVTStudy Indications: COVID-19 positive, d-dimer 0.7.   Limitations: Body habitus, poor ultrasound/tissue interface and poor patient cooperation; patient unable to tolerate compression maneuvers. Comparison Study: No prior study Performing Technologist: Gertie Fey MHA, RDMS, RVT, RDCS  Examination Guidelines: A complete evaluation includes B-mode imaging, spectral Doppler, color Doppler, and power Doppler as needed of all accessible portions of each vessel. Bilateral testing is considered an integral part of a complete examination. Limited examinations for reoccurring indications may be performed as noted. The reflux portion of the exam is performed with the patient in reverse Trendelenburg.  +---------+---------------+---------+-----------+----------+--------------+ RIGHT    CompressibilityPhasicitySpontaneityPropertiesThrombus Aging +---------+---------------+---------+-----------+----------+--------------+ CFV      Full           Yes      Yes        Patent                   +---------+---------------+---------+-----------+----------+--------------+ FV Prox  Full                                                        +---------+---------------+---------+-----------+----------+--------------+ FV Mid                           Yes                                 +---------+---------------+---------+-----------+----------+--------------+ FV Distal                        Yes                                 +---------+---------------+---------+-----------+----------+--------------+ POP                              Yes                                 +---------+---------------+---------+-----------+----------+--------------+ PTV      Full                                                        +---------+---------------+---------+-----------+----------+--------------+ PERO     Full                                                        +---------+---------------+---------+-----------+----------+--------------+  Right Technical Findings: Not visualized segments include SFJ, PFV,.  +---------+---------------+---------+-----------+----------+--------------+ LEFT     CompressibilityPhasicitySpontaneityPropertiesThrombus Aging +---------+---------------+---------+-----------+----------+--------------+ CFV                     Yes      Yes                                 +---------+---------------+---------+-----------+----------+--------------+ FV Prox                          Yes                                 +---------+---------------+---------+-----------+----------+--------------+ FV Mid                           Yes                                 +---------+---------------+---------+-----------+----------+--------------+ FV Distal                        Yes                                 +---------+---------------+---------+-----------+----------+--------------+ POP                     Yes      Yes                                 +---------+---------------+---------+-----------+----------+--------------+ PTV                              Yes                                 +---------+---------------+---------+-----------+----------+--------------+ PERO                             Yes                                 +---------+---------------+---------+-----------+----------+--------------+   Left Technical Findings: Not visualized segments include SFJ, PFV,.   Summary: BILATERAL: -No evidence of popliteal cyst, bilaterally. RIGHT: - There is no evidence of deep vein thrombosis in the lower extremity. However, portions of this examination were limited- see technologist comments above.  LEFT: - There is no evidence of deep vein thrombosis in the lower extremity. However, portions of this examination were limited- see technologist comments above.  *See table(s) above for measurements and observations. Electronically signed by Gretta Began MD on 08/03/2020 at 5:06:26 PM.     Final    ECHOCARDIOGRAM LIMITED  Result Date: 08/02/2020    ECHOCARDIOGRAM REPORT   Patient Name:   ENIYA CANNADY Date of Exam: 08/02/2020 Medical Rec #:  [id number]       Height:       67.0 in Accession #:    [id number]  Weight:       380.0 lb Date of Birth:  [birth year]       BSA:          2.656 m Patient Age:    34 years        BP:           146/84 mmHg Patient Gender: F               HR:           55 bpm. Exam Location:  Inpatient Procedure: Limited Echo, Cardiac Doppler and Color Doppler Indications:    CHF-Acute Diastolic  History:        Patient has no prior history of Echocardiogram examinations.                 Risk Factors:Hypertension, Diabetes and Current Smoker.                 COVID-19.  Sonographer:    Ross Ludwig RDCS (AE) Referring Phys: [id number] [place] [facility]  Sonographer Comments: No subcostal window and patient is morbidly obese. Image acquisition challenging due to patient body habitus. COVID-19 IMPRESSIONS  1. Left ventricular ejection fraction, by estimation, is 60 to 65%. The left ventricle has normal function. The left ventricle has no regional wall motion abnormalities. There is moderate left ventricular hypertrophy. Left ventricular diastolic parameters were normal.  2. Right ventricular systolic function is normal. The right ventricular size is normal.  3. The mitral valve is grossly normal. Trivial mitral valve regurgitation.  4. The aortic valve is tricuspid. Aortic valve regurgitation is mild. Mild aortic valve sclerosis is present, with no evidence of aortic valve stenosis. Aortic regurgitation PHT measures 480 msec.  5. The inferior vena cava is normal in size with <50% respiratory variability, suggesting right atrial pressure of 8 mmHg. Comparison(s): No prior Echocardiogram. FINDINGS  Left Ventricle: Left ventricular ejection fraction, by estimation, is 60 to 65%. The left ventricle has normal function. The left ventricle has no regional wall motion abnormalities. The left  ventricular internal cavity size was normal in size. There is  moderate left ventricular hypertrophy. Left ventricular diastolic parameters were normal. Indeterminate filling pressures. Right Ventricle: The right ventricular size is normal. No increase in right ventricular wall thickness. Right ventricular systolic function is normal. Left Atrium: Left atrial size was normal in size. Right Atrium: Right atrial size was normal in size. Pericardium: There is no evidence of pericardial effusion. Mitral Valve: The mitral valve is grossly normal. Trivial mitral valve regurgitation. Tricuspid Valve: The tricuspid valve is grossly normal. Tricuspid valve regurgitation is trivial. Aortic Valve: The aortic valve is tricuspid. Aortic valve regurgitation is mild. Aortic regurgitation PHT measures 480 msec. Mild aortic valve sclerosis is present, with no evidence of aortic valve stenosis. Pulmonic Valve: The pulmonic valve was normal in structure. Pulmonic valve regurgitation is not visualized. Aorta: The aortic root and ascending aorta are structurally normal, with no evidence of dilitation. Venous: The inferior vena cava is normal in size with less than 50% respiratory variability, suggesting right atrial pressure of 8 mmHg. IAS/Shunts: No atrial level shunt detected by color flow Doppler.  LEFT VENTRICLE PLAX 2D LVIDd:         4.70 cm  Diastology LVIDs:         3.10 cm  LV e' medial:    9.03 cm/s LV PW:         1.50 cm  LV E/e' medial:  10.8 LV IVS:  1.60 cm  LV e' lateral:   9.25 cm/s LVOT diam:     2.00 cm  LV E/e' lateral: 10.5 LVOT Area:     3.14 cm  IVC IVC diam: 1.70 cm LEFT ATRIUM         Index LA diam:    3.20 cm 1.20 cm/m  AORTIC VALVE AI PHT:      480 msec  AORTA Ao Root diam: 3.10 cm MITRAL VALVE MV Area (PHT): 3.27 cm    SHUNTS MV Decel Time: 232 msec    Systemic Diam: 2.00 cm MV E velocity: 97.30 cm/s MV A velocity: 58.30 cm/s MV E/A ratio:  1.67 Zoila Shutter MD Electronically signed by Zoila Shutter  MD Signature Date/Time: 08/02/2020/12:35:41 PM    Final

## 2020-08-05 LAB — CBC WITH DIFFERENTIAL/PLATELET
Abs Immature Granulocytes: 0.5 10*3/uL — ABNORMAL HIGH (ref 0.00–0.07)
Basophils Absolute: 0 10*3/uL (ref 0.0–0.1)
Basophils Relative: 0 %
Eosinophils Absolute: 0 10*3/uL (ref 0.0–0.5)
Eosinophils Relative: 0 %
HCT: 36.9 % (ref 36.0–46.0)
Hemoglobin: 11.4 g/dL — ABNORMAL LOW (ref 12.0–15.0)
Immature Granulocytes: 5 %
Lymphocytes Relative: 27 %
Lymphs Abs: 2.6 10*3/uL (ref 0.7–4.0)
MCH: 24.9 pg — ABNORMAL LOW (ref 26.0–34.0)
MCHC: 30.9 g/dL (ref 30.0–36.0)
MCV: 80.7 fL (ref 80.0–100.0)
Monocytes Absolute: 0.7 10*3/uL (ref 0.1–1.0)
Monocytes Relative: 7 %
Neutro Abs: 5.9 10*3/uL (ref 1.7–7.7)
Neutrophils Relative %: 61 %
Platelets: 452 10*3/uL — ABNORMAL HIGH (ref 150–400)
RBC: 4.57 MIL/uL (ref 3.87–5.11)
RDW: 16.5 % — ABNORMAL HIGH (ref 11.5–15.5)
WBC: 9.7 10*3/uL (ref 4.0–10.5)
nRBC: 0 % (ref 0.0–0.2)

## 2020-08-05 LAB — COMPREHENSIVE METABOLIC PANEL
ALT: 61 U/L — ABNORMAL HIGH (ref 0–44)
AST: 47 U/L — ABNORMAL HIGH (ref 15–41)
Albumin: 2.5 g/dL — ABNORMAL LOW (ref 3.5–5.0)
Alkaline Phosphatase: 46 U/L (ref 38–126)
Anion gap: 8 (ref 5–15)
BUN: 10 mg/dL (ref 6–20)
CO2: 27 mmol/L (ref 22–32)
Calcium: 8.2 mg/dL — ABNORMAL LOW (ref 8.9–10.3)
Chloride: 104 mmol/L (ref 98–111)
Creatinine, Ser: 0.81 mg/dL (ref 0.44–1.00)
GFR calc Af Amer: >60 mL/min (ref >60)
GFR calc non Af Amer: >60 mL/min (ref >60)
Glucose, Bld: 95 mg/dL (ref 70–99)
Potassium: 3.5 mmol/L (ref 3.5–5.1)
Sodium: 139 mmol/L (ref 135–145)
Total Bilirubin: 0.4 mg/dL (ref 0.3–1.2)
Total Protein: 6.2 g/dL — ABNORMAL LOW (ref 6.5–8.1)

## 2020-08-05 LAB — MAGNESIUM: Magnesium: 2.2 mg/dL (ref 1.7–2.4)

## 2020-08-05 LAB — C-REACTIVE PROTEIN: CRP: 0.6 mg/dL (ref <1.0)

## 2020-08-05 LAB — BRAIN NATRIURETIC PEPTIDE: B Natriuretic Peptide: 27.5 pg/mL (ref 0.0–100.0)

## 2020-08-05 LAB — D-DIMER, QUANTITATIVE: D-Dimer, Quant: 0.45 ug/mL-FEU (ref 0.00–0.50)

## 2020-08-05 NOTE — Progress Notes (Signed)
Lacey Whitaker to be D/C'd home per MD order. Discussed with the patient and all questions fully answered.   VVS, Skin clean, dry and intact without evidence of skin break down, no evidence of skin tears noted.  IV catheter discontinued intact. Site without signs and symptoms of complications. Dressing and pressure applied.  An After Visit Summary was printed and given to the patient.  Patient escorted via WC, and D/C home via private auto.  Lacey Whitaker  08/05/2020 4:03 PM

## 2020-08-05 NOTE — Progress Notes (Signed)
SATURATION QUALIFICATIONS: (This note is used to comply with regulatory documentation for home oxygen)  Patient Saturations on Room Air at Rest = 90%  Patient Saturations on Room Air while Ambulating = 83%  Patient Saturations on 2 Liters of oxygen while Ambulating = 89%  Please briefly explain why patient needs home oxygen: Pt's O2 drooped while ambulating

## 2020-08-05 NOTE — TOC Transition Note (Addendum)
Transition of Care [facility]) - CM/SW Discharge Note   Patient Details  Name: Lacey Whitaker MRN: [id number] Date of Birth: 10-19-1986  [facility] CM/SW Contact:  Lockie Pares, RN Phone Number: 08/05/2020, 1:16 PM   Clinical Narrative:    Patient self pay- set up with [employer], for oxygen, she is aware that they will be calling for up front nad how much it is ( 120.00) for one month. Appt made at post COVID clinic, she has questions that I referred her to the clinic for, back to work, testing post and vaccination post. Will be discharging upon oxygen arrival, address verified with patient. Has transport to home.   Final next level of care: Home/Self Care Barriers to Discharge: No Barriers Identified   Patient Goals and CMS Choice Patient states their goals for this hospitalization and ongoing recovery are:: Go home get well   Choice offered to / list presented to : Patient  Discharge Placement                       Discharge Plan and Services   Discharge Planning Services: Follow-up appt scheduled            DME Arranged: Oxygen DME Agency: Other - Comment ([employer]) Date DME Agency Contacted: 08/05/20 Time DME Agency Contacted: 1308 Representative spoke with at DME Agency: Shaune Leeks            Social Determinants of Health (SDOH) Interventions     Readmission Risk Interventions No flowsheet data found.

## 2020-08-05 NOTE — Discharge Instructions (Signed)
Follow with Primary MD Elmore Guise, FNP, get outpatient sleep study none  in 7 days   Get CBC, CMP, 2 view Chest X ray -  checked next visit within 1 week by Primary MD   Activity: As tolerated with Full fall precautions use walker/cane & assistance as needed  Disposition Home    Diet: Heart Healthy    Special Instructions: If you have smoked or chewed Tobacco  in the last 2 yrs please stop smoking, stop any regular Alcohol  and or any Recreational drug use.  On your next visit with your primary care physician please Get Medicines reviewed and adjusted.  Please request your Prim.MD to go over all Hospital Tests and Procedure/Radiological results at the follow up, please get all Hospital records sent to your Prim MD by signing hospital release before you go home.  If you experience worsening of your admission symptoms, develop shortness of breath, life threatening emergency, suicidal or homicidal thoughts you must seek medical attention immediately by calling 911 or calling your MD immediately  if symptoms less severe.  You Must read complete instructions/literature along with all the possible adverse reactions/side effects for all the Medicines you take and that have been prescribed to you. Take any new Medicines after you have completely understood and accpet all the possible adverse reactions/side effects.      Person Under Monitoring Name: Lacey Whitaker  Location: [address] [place] [address]   Infection Prevention Recommendations for Individuals Confirmed to have, or Being Evaluated for, 2019 Novel Coronavirus (COVID-19) Infection Who Receive Care at Home  Individuals who are confirmed to have, or are being evaluated for, COVID-19 should follow the prevention steps below until a healthcare provider or local or state health department says they can return to normal activities.  Stay home except to get medical care You should restrict activities outside your  home, except for getting medical care. Do not go to work, school, or public areas, and do not use public transportation or taxis.  Call ahead before visiting your doctor Before your medical appointment, call the healthcare provider and tell them that you have, or are being evaluated for, COVID-19 infection. This will help the healthcare provider's office take steps to keep other people from getting infected. Ask your healthcare provider to call the local or state health department.  Monitor your symptoms Seek prompt medical attention if your illness is worsening (e.g., difficulty breathing). Before going to your medical appointment, call the healthcare provider and tell them that you have, or are being evaluated for, COVID-19 infection. Ask your healthcare provider to call the local or state health department.  Wear a facemask You should wear a facemask that covers your nose and mouth when you are in the same room with other people and when you visit a healthcare provider. People who live with or visit you should also wear a facemask while they are in the same room with you.  Separate yourself from other people in your home As much as possible, you should stay in a different room from other people in your home. Also, you should use a separate bathroom, if available.  Avoid sharing household items You should not share dishes, drinking glasses, cups, eating utensils, towels, bedding, or other items with other people in your home. After using these items, you should wash them thoroughly with soap and water.  Cover your coughs and sneezes Cover your mouth and nose with a tissue when you cough or sneeze, or  you can cough or sneeze into your sleeve. Throw used tissues in a lined trash can, and immediately wash your hands with soap and water for at least 20 seconds or use an alcohol-based hand rub.  Wash your [facility] your hands often and thoroughly with soap and water for at least 20  seconds. You can use an alcohol-based hand sanitizer if soap and water are not available and if your hands are not visibly dirty. Avoid touching your eyes, nose, and mouth with unwashed hands.   Prevention Steps for Caregivers and Household Members of Individuals Confirmed to have, or Being Evaluated for, COVID-19 Infection Being Cared for in the Home  If you live with, or provide care at home for, a person confirmed to have, or being evaluated for, COVID-19 infection please follow these guidelines to prevent infection:  Follow healthcare provider's instructions Make sure that you understand and can help the patient follow any healthcare provider instructions for all care.  Provide for the patient's basic needs You should help the patient with basic needs in the home and provide support for getting groceries, prescriptions, and other personal needs.  Monitor the patient's symptoms If they are getting sicker, call his or her medical provider and tell them that the patient has, or is being evaluated for, COVID-19 infection. This will help the healthcare provider's office take steps to keep other people from getting infected. Ask the healthcare provider to call the local or state health department.  Limit the number of people who have contact with the patient  If possible, have only one caregiver for the patient.  Other household members should stay in another home or place of residence. If this is not possible, they should stay  in another room, or be separated from the patient as much as possible. Use a separate bathroom, if available.  Restrict visitors who do not have an essential need to be in the home.  Keep older adults, very young children, and other sick people away from the patient Keep older adults, very young children, and those who have compromised immune systems or chronic health conditions away from the patient. This includes people with chronic heart, lung, or kidney  conditions, diabetes, and cancer.  Ensure good ventilation Make sure that shared spaces in the home have good air flow, such as from an air conditioner or an opened window, weather permitting.  Wash your hands often  Wash your hands often and thoroughly with soap and water for at least 20 seconds. You can use an alcohol based hand sanitizer if soap and water are not available and if your hands are not visibly dirty.  Avoid touching your eyes, nose, and mouth with unwashed hands.  Use disposable paper towels to dry your hands. If not available, use dedicated cloth towels and replace them when they become wet.  Wear a facemask and gloves  Wear a disposable facemask at all times in the room and gloves when you touch or have contact with the patient's blood, body fluids, and/or secretions or excretions, such as sweat, saliva, sputum, nasal mucus, vomit, urine, or feces.  Ensure the mask fits over your nose and mouth tightly, and do not touch it during use.  Throw out disposable facemasks and gloves after using them. Do not reuse.  Wash your hands immediately after removing your facemask and gloves.  If your personal clothing becomes contaminated, carefully remove clothing and launder. Wash your hands after handling contaminated clothing.  Place all used disposable facemasks,  gloves, and other waste in a lined container before disposing them with other household waste.  Remove gloves and wash your hands immediately after handling these items.  Do not share dishes, glasses, or other household items with the patient  Avoid sharing household items. You should not share dishes, drinking glasses, cups, eating utensils, towels, bedding, or other items with a patient who is confirmed to have, or being evaluated for, COVID-19 infection.  After the person uses these items, you should wash them thoroughly with soap and water.  Wash laundry thoroughly  Immediately remove and wash clothes or  bedding that have blood, body fluids, and/or secretions or excretions, such as sweat, saliva, sputum, nasal mucus, vomit, urine, or feces, on them.  Wear gloves when handling laundry from the patient.  Read and follow directions on labels of laundry or clothing items and detergent. In general, wash and dry with the warmest temperatures recommended on the label.  Clean all areas the individual has used often  Clean all touchable surfaces, such as counters, tabletops, doorknobs, bathroom fixtures, toilets, phones, keyboards, tablets, and bedside tables, every day. Also, clean any surfaces that may have blood, body fluids, and/or secretions or excretions on them.  Wear gloves when cleaning surfaces the patient has come in contact with.  Use a diluted bleach solution (e.g., dilute bleach with 1 part bleach and 10 parts water) or a household disinfectant with a label that says EPA-registered for coronaviruses. To make a bleach solution at home, add 1 tablespoon of bleach to 1 quart (4 cups) of water. For a larger supply, add  cup of bleach to 1 gallon (16 cups) of water.  Read labels of cleaning products and follow recommendations provided on product labels. Labels contain instructions for safe and effective use of the cleaning product including precautions you should take when applying the product, such as wearing gloves or eye protection and making sure you have good ventilation during use of the product.  Remove gloves and wash hands immediately after cleaning.  Monitor yourself for signs and symptoms of illness Caregivers and household members are considered close contacts, should monitor their health, and will be asked to limit movement outside of the home to the extent possible. Follow the monitoring steps for close contacts listed on the symptom monitoring form.   ? If you have additional questions, contact your local health department or call the epidemiologist on call at  [phone] (available 24/7). ? This guidance is subject to change. For the most up-to-date guidance from [facility], please refer to their website: [email or web address]

## 2020-08-05 NOTE — Discharge Summary (Signed)
Worthy Flank ZOX:[id number] DOB: 1985/12/14 DOA: 08/01/2020  PCP: Annie Main, FNP  Admit date: 08/01/2020  Discharge date: 08/05/2020  Admitted From: Home   Disposition:  Home   Recommendations for Outpatient Follow-up:   Follow up with PCP in 1-2 weeks  PCP Please obtain BMP/CBC, 2 view CXR in 1week,  (see Discharge instructions)   PCP Please follow up on the following pending results:    Home Health: None   Equipment/Devices: 2lit [place] o2 PRN  Consultations: None  Discharge Condition: Stable    CODE STATUS: Full    Diet Recommendation: Heart Healthy   Diet Order            Diet - low sodium heart healthy           Diet 2 gram sodium Room service appropriate? Yes; Fluid consistency: Thin  Diet effective now                  Chief Complaint  Patient presents with  . Covid Positive  . Chest Pain     Brief history of present illness from the day of admission and additional interim summary     Lacey Rhoadesis a 34 y.o.femalewith medical history significant ofmild intermittent asthma, HTN, morbid obesity, iron deficiency anemia, presented with increasing short of breath, dry cough and pleuritic chest pain, she came to the ER where she was diagnosed with COVID-19 pneumonia and admitted to the hospital.                                                                 Hospital Course   1. Acute Hypoxic Resp. Failure due to Acute Covid 19 Viral Pneumonitis during the ongoing 2020 Covid 19 Pandemic - she unfortunately is unvaccinated and in good moderate parenchymal lung injury, she was treated with IV steroids and remdesivir along with Baricitinib, she has completed her treatment course and currently stable on 1 L nasal cannula oxygen at rest, upon ambulation she requires 2 L.  Her inflammatory  markers have come down to normal limits, at this time she will be discharged home on her home dose respiratory inhaler, she has finished her course of treatment, will follow with PCP in a week.  Request PCP to kindly order outpatient sleep study for her as I think she most likely has underlying undiagnosed OSA along with OSH.   Recent Labs  Lab 08/01/20 1742 08/02/20 0510 08/02/20 0728 08/03/20 0530 08/04/20 0755 08/05/20 0623  CRP 9.3* 8.4*  --  2.6* 0.9 0.6  DDIMER 0.73* 0.71*  --  0.49 0.40 0.45  FERRITIN 83 82  --   --   --   --   BNP  --   --  23.9 14.8 25.1 27.5  PROCALCITON <0.10  --   --   --   --   --  Hepatic Function Latest Ref Rng & Units 08/05/2020 08/04/2020 08/03/2020  Total Protein 6.5 - 8.1 g/dL 6.2(L) 6.6 6.3(L)  Albumin 3.5 - 5.0 g/dL 2.5(L) 2.6(L) 2.4(L)  AST 15 - 41 U/L 47(H) 51(H) 60(H)  ALT 0 - 44 U/L 61(H) 60(H) 51(H)  Alk Phosphatase 38 - 126 U/L 46 51 50  Total Bilirubin 0.3 - 1.2 mg/dL 0.4 0.4 0.5  Bilirubin, Direct 0.0 - 0.2 mg/dL - - -     2.  Obesity with likely undiagnosed OSA can OSA.  BMI over 35.  Follow with PCP for weight loss.  Will benefit from outpatient sleep study will defer this to PCP.  3.  Mild COVID-19 viral infection related asymptomatic transaminitis.  Stable, monitor trend.  4.  Chronic ankle swelling.  Likely related to morbid obesity, much improved after single dose of Lasix, echocardiogram and leg venous ultrasound unremarkable.  5.  History of asthma.  Continue supportive care, currently no wheezing.  6.  Hypertension.  For now Norvasc.  7.  Mild asymptomatic transaminitis due to COVID-19 viral infection, trend is stable PCP to repeat CMP in 7 to 10 days.   Discharge diagnosis     Active Problems:   COVID-19    Discharge instructions    Discharge Instructions    Diet - low sodium heart healthy   Complete by: As directed    Discharge instructions   Complete by: As directed    Follow with Primary MD Annie Main, FNP, get outpatient sleep study none  in 7 days   Get CBC, CMP, 2 view Chest X ray -  checked next visit within 1 week by Primary MD   Activity: As tolerated with Full fall precautions use walker/cane & assistance as needed  Disposition Home    Diet: Heart Healthy    Special Instructions: If you have smoked or chewed Tobacco  in the last 2 yrs please stop smoking, stop any regular Alcohol  and or any Recreational drug use.  On your next visit with your primary care physician please Get Medicines reviewed and adjusted.  Please request your Prim.MD to go over all Hospital Tests and Procedure/Radiological results at the follow up, please get all Hospital records sent to your Prim MD by signing hospital release before you go home.  If you experience worsening of your admission symptoms, develop shortness of breath, life threatening emergency, suicidal or homicidal thoughts you must seek medical attention immediately by calling 911 or calling your MD immediately  if symptoms less severe.  You Must read complete instructions/literature along with all the possible adverse reactions/side effects for all the Medicines you take and that have been prescribed to you. Take any new Medicines after you have completely understood and accpet all the possible adverse reactions/side effects.      Discharge Medications   Allergies as of 08/05/2020   No Known Allergies     Medication List    STOP taking these medications   benzocaine 20 % Gel Commonly known as: HURRICAINE   furosemide 20 MG tablet Commonly known as: LASIX   ibuprofen 800 MG tablet Commonly known as: ADVIL   naproxen 500 MG tablet Commonly known as: NAPROSYN   predniSONE 10 MG (21) Tbpk tablet Commonly known as: STERAPRED UNI-PAK 21 TAB     TAKE these medications   diclofenac Sodium 1 % Gel Commonly known as: VOLTAREN Apply 4 g topically 4 (four) times daily.   levonorgestrel 20 MCG/24HR IUD Commonly known  as:  MIRENA 1 each by Intrauterine route once.   lisinopril-hydrochlorothiazide 20-12.5 MG tablet Commonly known as: Zestoretic Take 2 tablets by mouth daily.   omeprazole 20 MG capsule Commonly known as: PRILOSEC Take 1 capsule (20 mg total) by mouth daily.   Proventil HFA 108 (90 Base) MCG/ACT inhaler Generic drug: albuterol Inhale 2 puffs into the lungs every 6 (six) hours as needed for wheezing or shortness of breath.   tiZANidine 2 MG tablet Commonly known as: ZANAFLEX Take 2 mg by mouth every 6 (six) hours as needed for muscle spasms.   Vitamin D (Ergocalciferol) 1.25 MG (50000 UNIT) Caps capsule Commonly known as: DRISDOL Take 50,000 Units by mouth every Monday.            Durable Medical Equipment  (From admission, onward)         Start     Ordered   08/04/20 1123  For home use only DME oxygen  Once       Question Answer Comment  Length of Need 6 Months   Mode or (Route) Nasal cannula   Liters per Minute 2   Frequency Continuous (stationary and portable oxygen unit needed)   Oxygen conserving device No   Oxygen delivery system Gas      08/04/20 1122           Follow-up Information    [facility]. Go on 08/16/2020.   Why: 0845 am Contact information: [address] [place] [address] [phone]       Annie Main, [place]. Schedule an appointment as soon as possible for a visit in 1 week(s).   Specialty: Family Medicine Contact information: [address] [place] [address] [phone]               Major procedures and Radiology Reports - PLEASE review detailed and final reports thoroughly  -         DG Chest Port 1 View  Result Date: 08/02/2020 CLINICAL DATA:  COVID-19 infection. EXAM: PORTABLE CHEST 1 VIEW COMPARISON:  08/01/2020; 06/25/2019 FINDINGS: Examination is again significantly limited due to patient body habitus. Grossly unchanged cardiac silhouette and mediastinal  contours. Minimally improved aeration of the bilateral upper lungs with persistent rather extensive nodular airspace opacities with basilar and peripheral predominance. No definite pleural effusion. No pneumothorax. No definite evidence of edema. No acute osseous abnormalities. IMPRESSION: Minimally improved aeration of the bilateral upper lungs with persistent extensive bilateral nodular airspace opacities again worrisome for multifocal infection compatible with provided history of COVID-19. Electronically Signed   By: Sandi Mariscal M.D.   On: 08/02/2020 07:52   DG Chest Portable 1 View  Result Date: 08/01/2020 CLINICAL DATA:  Short of breath, positive COVID EXAM: PORTABLE CHEST 1 VIEW COMPARISON:  Prior chest x-ray 06/25/2019 FINDINGS: Interval development of patchy interstitial and airspace opacities bilaterally. Stable cardiac and mediastinal contours given portable frontal technique. Inspiratory volumes are low. No pneumothorax or pleural effusion. No acute osseous abnormality. IMPRESSION: 1. Developing bilateral interstitial and airspace opacities concerning for COVID pneumonia. 2. In the appropriate clinical setting, pulmonary edema could appear similar. This is considered less likely given patient's age and known COVID positive status. 3. Low inspiratory volumes. Electronically Signed   By: Jacqulynn Cadet M.D.   On: 08/01/2020 12:03   VAS [place] LOWER EXTREMITY VENOUS (DVT)  Result Date: 08/03/2020  Lower Venous DVTStudy Indications: COVID-19 positive, d-dimer 0.7.  Limitations: Body habitus, poor ultrasound/tissue interface and poor patient cooperation;  patient unable to tolerate compression maneuvers. Comparison Study: No prior study Performing Technologist: Maudry Mayhew MHA, RDMS, RVT, RDCS  Examination Guidelines: A complete evaluation includes B-mode imaging, spectral Doppler, color Doppler, and power Doppler as needed of all accessible portions of each vessel. Bilateral testing is considered  an integral part of a complete examination. Limited examinations for reoccurring indications may be performed as noted. The reflux portion of the exam is performed with the patient in reverse Trendelenburg.  +---------+---------------+---------+-----------+----------+--------------+ RIGHT    CompressibilityPhasicitySpontaneityPropertiesThrombus Aging +---------+---------------+---------+-----------+----------+--------------+ CFV      Full           Yes      Yes        Patent                   +---------+---------------+---------+-----------+----------+--------------+ FV Prox  Full                                                        +---------+---------------+---------+-----------+----------+--------------+ FV Mid                           Yes                                 +---------+---------------+---------+-----------+----------+--------------+ FV Distal                        Yes                                 +---------+---------------+---------+-----------+----------+--------------+ POP                              Yes                                 +---------+---------------+---------+-----------+----------+--------------+ PTV      Full                                                        +---------+---------------+---------+-----------+----------+--------------+ PERO     Full                                                        +---------+---------------+---------+-----------+----------+--------------+   Right Technical Findings: Not visualized segments include SFJ, PFV,.  +---------+---------------+---------+-----------+----------+--------------+ LEFT     CompressibilityPhasicitySpontaneityPropertiesThrombus Aging +---------+---------------+---------+-----------+----------+--------------+ CFV                     Yes      Yes                                  +---------+---------------+---------+-----------+----------+--------------+ FV Prox  Yes                                 +---------+---------------+---------+-----------+----------+--------------+ FV Mid                           Yes                                 +---------+---------------+---------+-----------+----------+--------------+ FV Distal                        Yes                                 +---------+---------------+---------+-----------+----------+--------------+ POP                     Yes      Yes                                 +---------+---------------+---------+-----------+----------+--------------+ PTV                              Yes                                 +---------+---------------+---------+-----------+----------+--------------+ PERO                             Yes                                 +---------+---------------+---------+-----------+----------+--------------+   Left Technical Findings: Not visualized segments include SFJ, PFV,.   Summary: BILATERAL: -No evidence of popliteal cyst, bilaterally. RIGHT: - There is no evidence of deep vein thrombosis in the lower extremity. However, portions of this examination were limited- see technologist comments above.  LEFT: - There is no evidence of deep vein thrombosis in the lower extremity. However, portions of this examination were limited- see technologist comments above.  *See table(s) above for measurements and observations. Electronically signed by Curt Jews MD on 08/03/2020 at 5:06:26 PM.    Final    ECHOCARDIOGRAM LIMITED  Result Date: 08/02/2020    ECHOCARDIOGRAM REPORT   Patient Name:   Lacey Whitaker Date of Exam: 08/02/2020 Medical Rec #:  [id number]       Height:       67.0 in Accession #:    [id number]      Weight:       380.0 lb Date of Birth:  09-05-1986       BSA:          2.656 m Patient Age:    34 years        BP:           146/84 mmHg  Patient Gender: F               HR:           55 bpm. Exam Location:  Inpatient Procedure: Limited Echo, Cardiac Doppler and Color Doppler Indications:  CHF-Acute Diastolic  History:        Patient has no prior history of Echocardiogram examinations.                 Risk Factors:Hypertension, Diabetes and Current Smoker.                 COVID-19.  Sonographer:    Clayton Lefort RDCS (AE) Referring Phys: [id number] Lequita Halt  Sonographer Comments: No subcostal window and patient is morbidly obese. Image acquisition challenging due to patient body habitus. COVID-19 IMPRESSIONS  1. Left ventricular ejection fraction, by estimation, is 60 to 65%. The left ventricle has normal function. The left ventricle has no regional wall motion abnormalities. There is moderate left ventricular hypertrophy. Left ventricular diastolic parameters were normal.  2. Right ventricular systolic function is normal. The right ventricular size is normal.  3. The mitral valve is grossly normal. Trivial mitral valve regurgitation.  4. The aortic valve is tricuspid. Aortic valve regurgitation is mild. Mild aortic valve sclerosis is present, with no evidence of aortic valve stenosis. Aortic regurgitation PHT measures 480 msec.  5. The inferior vena cava is normal in size with <50% respiratory variability, suggesting right atrial pressure of 8 mmHg. Comparison(s): No prior Echocardiogram. FINDINGS  Left Ventricle: Left ventricular ejection fraction, by estimation, is 60 to 65%. The left ventricle has normal function. The left ventricle has no regional wall motion abnormalities. The left ventricular internal cavity size was normal in size. There is  moderate left ventricular hypertrophy. Left ventricular diastolic parameters were normal. Indeterminate filling pressures. Right Ventricle: The right ventricular size is normal. No increase in right ventricular wall thickness. Right ventricular systolic function is normal. Left Atrium: Left atrial size was  normal in size. Right Atrium: Right atrial size was normal in size. Pericardium: There is no evidence of pericardial effusion. Mitral Valve: The mitral valve is grossly normal. Trivial mitral valve regurgitation. Tricuspid Valve: The tricuspid valve is grossly normal. Tricuspid valve regurgitation is trivial. Aortic Valve: The aortic valve is tricuspid. Aortic valve regurgitation is mild. Aortic regurgitation PHT measures 480 msec. Mild aortic valve sclerosis is present, with no evidence of aortic valve stenosis. Pulmonic Valve: The pulmonic valve was normal in structure. Pulmonic valve regurgitation is not visualized. Aorta: The aortic root and ascending aorta are structurally normal, with no evidence of dilitation. Venous: The inferior vena cava is normal in size with less than 50% respiratory variability, suggesting right atrial pressure of 8 mmHg. IAS/Shunts: No atrial level shunt detected by color flow Doppler.  LEFT VENTRICLE PLAX 2D LVIDd:         4.70 cm  Diastology LVIDs:         3.10 cm  LV e' medial:    9.03 cm/s LV PW:         1.50 cm  LV E/e' medial:  10.8 LV IVS:        1.60 cm  LV e' lateral:   9.25 cm/s LVOT diam:     2.00 cm  LV E/e' lateral: 10.5 LVOT Area:     3.14 cm  IVC IVC diam: 1.70 cm LEFT ATRIUM         Index LA diam:    3.20 cm 1.20 cm/m  AORTIC VALVE AI PHT:      480 msec  AORTA Ao Root diam: 3.10 cm MITRAL VALVE MV Area (PHT): 3.27 cm    SHUNTS MV Decel Time: 232 msec    Systemic Diam: 2.00 cm  MV E velocity: 97.30 cm/s MV A velocity: 58.30 cm/s MV E/A ratio:  1.67 Lyman Bishop MD Electronically signed by Lyman Bishop MD Signature Date/Time: 08/02/2020/12:35:41 PM    Final     Micro Results     Recent Results (from the past 240 hour(s))  Blood Culture (routine x 2)     Status: None (Preliminary result)   Collection Time: 08/01/20  5:42 PM   Specimen: BLOOD  Result Value Ref Range Status   Specimen Description BLOOD RIGHT ANTECUBITAL  Final   Special Requests   Final     BOTTLES DRAWN AEROBIC AND ANAEROBIC Blood Culture adequate volume   Culture   Final    NO GROWTH 3 DAYS Performed at [facility], [address], [place], [place] [address]    Report Status PENDING  Incomplete  Blood Culture (routine x 2)     Status: None (Preliminary result)   Collection Time: 08/01/20  6:05 PM   Specimen: BLOOD RIGHT WRIST  Result Value Ref Range Status   Specimen Description BLOOD RIGHT WRIST  Final   Special Requests   Final    BOTTLES DRAWN AEROBIC AND ANAEROBIC Blood Culture adequate volume   Culture   Final    NO GROWTH 3 DAYS Performed at [facility], [place] [address] [place], [place] [address]    Report Status PENDING  Incomplete      Subjective    Lacey Whitaker today has no headache,no chest abdominal pain,no new weakness tingling or numbness, feels much better wants to go home today.     Objective   Blood pressure 121/74, pulse 62, temperature 97.7 F (36.5 C), temperature source Oral, resp. rate 20, SpO2 91 %.   Intake/Output Summary (Last 24 hours) at 08/05/2020 1003 Last data filed at 08/04/2020 1012 Gross per 24 hour  Intake 240 ml  Output --  Net 240 ml    Exam  Awake Alert, No new F.N deficits, Normal affect [place].AT,PERRAL Supple Neck,No JVD, No cervical lymphadenopathy appriciated.  Symmetrical Chest wall movement, Good air movement bilaterally, CTAB RRR,No Gallops,Rubs or new Murmurs, No Parasternal Heave +ve B.Sounds, Abd Soft, Non tender, No organomegaly appriciated, No rebound -guarding or rigidity. No Cyanosis, Clubbing or edema, No new Rash or bruise   Data Review   CBC w Diff:  Lab Results  Component Value Date   WBC 9.7 08/05/2020   HGB 11.4 (L) 08/05/2020   HGB 7.2 (L) 07/13/2011   HCT 36.9 08/05/2020   HCT 23.0 (L) 07/13/2011   PLT 452 (H) 08/05/2020   PLT 318 07/13/2011   LYMPHOPCT 27 08/05/2020   LYMPHOPCT 14.0 07/13/2011   MONOPCT 7 08/05/2020   MONOPCT 6.6 07/13/2011   EOSPCT 0 08/05/2020    EOSPCT 1.3 07/13/2011   BASOPCT 0 08/05/2020   BASOPCT 0.1 07/13/2011    CMP:  Lab Results  Component Value Date   NA 139 08/05/2020   K 3.5 08/05/2020   CL 104 08/05/2020   CO2 27 08/05/2020   BUN 10 08/05/2020   CREATININE 0.81 08/05/2020   CREATININE 0.82 01/19/2020   PROT 6.2 (L) 08/05/2020   ALBUMIN 2.5 (L) 08/05/2020   BILITOT 0.4 08/05/2020   ALKPHOS 46 08/05/2020   AST 47 (H) 08/05/2020   ALT 61 (H) 08/05/2020  .   Total Time in preparing paper work, data evaluation and todays exam - 38 minutes  Lala Lund M.D on 08/05/2020 at 10:03 AM  [facility]   Office  [phone]

## 2020-08-06 LAB — CULTURE, BLOOD (ROUTINE X 2)
Culture: NO GROWTH
Culture: NO GROWTH
Special Requests: ADEQUATE
Special Requests: ADEQUATE

## 2020-08-16 ENCOUNTER — Other Ambulatory Visit: Payer: Self-pay

## 2020-08-16 ENCOUNTER — Ambulatory Visit (INDEPENDENT_AMBULATORY_CARE_PROVIDER_SITE_OTHER): Payer: HRSA Program | Admitting: Nurse Practitioner

## 2020-08-16 VITALS — BP 142/88 | HR 91 | Temp 97.8°F | Ht 67.0 in | Wt 381.0 lb

## 2020-08-16 DIAGNOSIS — U071 COVID-19: Secondary | ICD-10-CM

## 2020-08-16 DIAGNOSIS — R0683 Snoring: Secondary | ICD-10-CM | POA: Insufficient documentation

## 2020-08-16 DIAGNOSIS — J9601 Acute respiratory failure with hypoxia: Secondary | ICD-10-CM

## 2020-08-16 DIAGNOSIS — R0602 Shortness of breath: Secondary | ICD-10-CM | POA: Diagnosis not present

## 2020-08-16 DIAGNOSIS — J1282 Pneumonia due to coronavirus disease 2019: Secondary | ICD-10-CM

## 2020-08-16 MED ORDER — BUDESONIDE-FORMOTEROL FUMARATE 80-4.5 MCG/ACT IN AERO: 2.0000 | INHALATION_SPRAY | Freq: Two times a day (BID) | RESPIRATORY_TRACT | 3 refills | Status: DC

## 2020-08-16 NOTE — Assessment & Plan Note (Signed)
Acute Hypoxic Respiratory Failure:  Patient walked in office today O2 sats dropped to 89% while ambulating, but recovered quickly with rest. May stay at room air. Please get pulse ox to check O2 sats while ambulating at home  Concern for possible sleep apnea per hospital note - will place referral to pulmonary  Will order symbicort - history of asthma  May continue albuterol as needed  Stay well hydrated  Stay active  Deep breathing exercises  May start vitamin C 2,000 mg daily, vitamin D3 2,000 IU daily, Zinc 220 mg daily, and Quercetin 500 mg twice daily  May take tylenol or fever or pain  May take mucinex DM twice daily  Will order chest x ray  Will order labs   Hypertension:  Please follow up with PCP   Follow up:  Follow up in 1 month and as needed

## 2020-08-16 NOTE — Progress Notes (Signed)
  ID: Lacey Whitaker, female    DOB: [birth year], 34 y.o.   MRN: [id number]  Chief Complaint  Patient presents with  . Hospitalization Follow-up    COVID Pos: 9/4 Hosp: 9/12-9/16 Sx: Breathing issues    Referring provider: Elmore Guise, FNP   34 year old female with history of mild intermittent asthma, hypertension, and morbid obesity, iron deficiency anemia.  Diagnosed with Covid on 07/24/2020.  HPI  Patient presents today for post COVID care visit/hospital follow-up.  Patient was admitted to the hospital on 08/01/2020 through 08/05/2020 for acute hypoxic respiratory failure and viral Covid pneumonia.  Patient was treated with IV steroids and remdesivir and baricitinib.  Patient was discharged home on 2 L of oxygen.  She states that she was sent home with an oxygen tank but it ran out that night.  She failed to follow-up with DME agency to get more oxygen sent out to the home.  She has not used oxygen since her discharge.  She states that overall she is improving.  She does still get short of breath with exertion.  It was noted in ED note that patient may have obstructive sleep apnea.  She has never been tested for this.  We discussed that we will refer her to pulmonary to follow-up and possibly a sleep study.  Patient is to follow-up with her primary care for hypertension.  It is also noted that patient had mild asymptomatic transaminitis and we will repeat labs today.  We will also repeat chest x-ray today. Denies f/c/s, n/v/d, hemoptysis, PND, chest pain or edema.      No Known Allergies  Immunization History  Administered Date(s) Administered  . Influenza,inj,Quad PF,6+ Mos 01/19/2020  . Tdap 09/04/2011, 02/06/2013    Past Medical History:  Diagnosis Date  . Asthma   . Cyst, mediastinum    pt. reports having the cyst removed last year and has reoccured  . Diabetes mellitus type II, controlled (HCC) 01/27/2020  . Iron deficiency anemia due to chronic blood loss   .  Morbid obesity (HCC)    s/p gastric bypass surgery in 2009  . Pregnancy induced hypertension    2006    Tobacco History: Social History   Tobacco Use  Smoking Status Current Some Day Smoker  . Types: Cigars  Smokeless Tobacco Never Used  Tobacco Comment   Smokes 2 Black & Milds daily   Ready to quit: Not Answered Counseling given: Not Answered Comment: Smokes 2 Black & Milds daily   Outpatient Encounter Medications as of 08/16/2020  Medication Sig  . albuterol (PROVENTIL HFA) 108 (90 Base) MCG/ACT inhaler Inhale 2 puffs into the lungs every 6 (six) hours as needed for wheezing or shortness of breath.  . levonorgestrel (MIRENA) 20 MCG/24HR IUD 1 each by Intrauterine route once.  Marland Kitchen omeprazole (PRILOSEC) 20 MG capsule Take 1 capsule (20 mg total) by mouth daily.  Marland Kitchen tiZANidine (ZANAFLEX) 2 MG tablet Take 2 mg by mouth every 6 (six) hours as needed for muscle spasms.  . Vitamin D, Ergocalciferol, (DRISDOL) 1.25 MG (50000 UT) CAPS capsule Take 50,000 Units by mouth every Monday.  . budesonide-formoterol (SYMBICORT) 80-4.5 MCG/ACT inhaler Inhale 2 puffs into the lungs 2 (two) times daily.  . diclofenac Sodium (VOLTAREN) 1 % GEL Apply 4 g topically 4 (four) times daily. (Patient not taking: Reported on 08/01/2020)  . lisinopril-hydrochlorothiazide (ZESTORETIC) 20-12.5 MG tablet Take 2 tablets by mouth daily.   No facility-administered encounter medications on file as of 08/16/2020.  Review of Systems  Review of Systems  Constitutional: Negative.  Negative for fatigue and fever.  HENT: Negative.   Respiratory: Positive for shortness of breath. Negative for cough.   Cardiovascular: Negative.  Negative for chest pain, palpitations and leg swelling.  Gastrointestinal: Negative.   Allergic/Immunologic: Negative.   Neurological: Negative.   Psychiatric/Behavioral: Negative.        Physical Exam  BP (!) 142/88 (BP Location: Right Arm)   Pulse 91   Temp 97.8 F (36.6 C)   Ht   (1.702 m)   Wt (!) 381 lb 0.1 oz (172.8 kg)   SpO2 96%   BMI 59.67 kg/m   Wt Readings from Last 5 Encounters:  08/16/20 (!) 381 lb 0.1 oz (172.8 kg)  07/24/20 (!) 380 lb (172.4 kg)  03/28/20 (!) 375 lb 10.6 oz (170.4 kg)  03/16/20 (!) 375 lb 9.6 oz (170.4 kg)  02/05/20 (!) 372 lb (168.7 kg)     Physical Exam Vitals and nursing note reviewed.  Constitutional:      General: She is not in acute distress.    Appearance: She is well-developed.  Cardiovascular:     Rate and Rhythm: Normal rate and regular rhythm.  Pulmonary:     Effort: Pulmonary effort is normal.     Breath sounds: Normal breath sounds.  Musculoskeletal:     Right lower leg: No edema.     Left lower leg: No edema.  Neurological:     Mental Status: She is alert and oriented to person, place, and time.  Psychiatric:        Mood and Affect: Mood normal.        Behavior: Behavior normal.      Imaging: DG Chest Port 1 View  Result Date: 08/02/2020 CLINICAL DATA:  COVID-19 infection. EXAM: PORTABLE CHEST 1 VIEW COMPARISON:  08/01/2020; 06/25/2019 FINDINGS: Examination is again significantly limited due to patient body habitus. Grossly unchanged cardiac silhouette and mediastinal contours. Minimally improved aeration of the bilateral upper lungs with persistent rather extensive nodular airspace opacities with basilar and peripheral predominance. No definite pleural effusion. No pneumothorax. No definite evidence of edema. No acute osseous abnormalities. IMPRESSION: Minimally improved aeration of the bilateral upper lungs with persistent extensive bilateral nodular airspace opacities again worrisome for multifocal infection compatible with provided history of COVID-19. Electronically Signed   By: Simonne Come M.D.   On: 08/02/2020 07:52   DG Chest Portable 1 View  Result Date: 08/01/2020 CLINICAL DATA:  Short of breath, positive COVID EXAM: PORTABLE CHEST 1 VIEW COMPARISON:  Prior chest x-ray 06/25/2019 FINDINGS:  Interval development of patchy interstitial and airspace opacities bilaterally. Stable cardiac and mediastinal contours given portable frontal technique. Inspiratory volumes are low. No pneumothorax or pleural effusion. No acute osseous abnormality. IMPRESSION: 1. Developing bilateral interstitial and airspace opacities concerning for COVID pneumonia. 2. In the appropriate clinical setting, pulmonary edema could appear similar. This is considered less likely given patient's age and known COVID positive status. 3. Low inspiratory volumes. Electronically Signed   By: Malachy Moan M.D.   On: 08/01/2020 12:03   VAS [place] LOWER EXTREMITY VENOUS (DVT)  Result Date: 08/03/2020  Lower Venous DVTStudy Indications: COVID-19 positive, d-dimer 0.7.  Limitations: Body habitus, poor ultrasound/tissue interface and poor patient cooperation; patient unable to tolerate compression maneuvers. Comparison Study: No prior study Performing Technologist: Gertie Fey MHA, RDMS, RVT, RDCS  Examination Guidelines: A complete evaluation includes B-mode imaging, spectral Doppler, color Doppler, and power Doppler as needed of  all accessible portions of each vessel. Bilateral testing is considered an integral part of a complete examination. Limited examinations for reoccurring indications may be performed as noted. The reflux portion of the exam is performed with the patient in reverse Trendelenburg.  +---------+---------------+---------+-----------+----------+--------------+ RIGHT    CompressibilityPhasicitySpontaneityPropertiesThrombus Aging +---------+---------------+---------+-----------+----------+--------------+ CFV      Full           Yes      Yes        Patent                   +---------+---------------+---------+-----------+----------+--------------+ FV Prox  Full                                                        +---------+---------------+---------+-----------+----------+--------------+ FV Mid                            Yes                                 +---------+---------------+---------+-----------+----------+--------------+ FV Distal                        Yes                                 +---------+---------------+---------+-----------+----------+--------------+ POP                              Yes                                 +---------+---------------+---------+-----------+----------+--------------+ PTV      Full                                                        +---------+---------------+---------+-----------+----------+--------------+ PERO     Full                                                        +---------+---------------+---------+-----------+----------+--------------+   Right Technical Findings: Not visualized segments include SFJ, PFV,.  +---------+---------------+---------+-----------+----------+--------------+ LEFT     CompressibilityPhasicitySpontaneityPropertiesThrombus Aging +---------+---------------+---------+-----------+----------+--------------+ CFV                     Yes      Yes                                 +---------+---------------+---------+-----------+----------+--------------+ FV Prox                          Yes                                 +---------+---------------+---------+-----------+----------+--------------+  FV Mid                           Yes                                 +---------+---------------+---------+-----------+----------+--------------+ FV Distal                        Yes                                 +---------+---------------+---------+-----------+----------+--------------+ POP                     Yes      Yes                                 +---------+---------------+---------+-----------+----------+--------------+ PTV                              Yes                                  +---------+---------------+---------+-----------+----------+--------------+ PERO                             Yes                                 +---------+---------------+---------+-----------+----------+--------------+   Left Technical Findings: Not visualized segments include SFJ, PFV,.   Summary: BILATERAL: -No evidence of popliteal cyst, bilaterally. RIGHT: - There is no evidence of deep vein thrombosis in the lower extremity. However, portions of this examination were limited- see technologist comments above.  LEFT: - There is no evidence of deep vein thrombosis in the lower extremity. However, portions of this examination were limited- see technologist comments above.  *See table(s) above for measurements and observations. Electronically signed by Gretta Beganodd Early MD on 08/03/2020 at 5:06:26 PM.    Final    ECHOCARDIOGRAM LIMITED  Result Date: 08/02/2020    ECHOCARDIOGRAM REPORT   Patient Name:   Lacey ElEIRDRE Schwandt Date of Exam: 08/02/2020 Medical Rec #:  [id number]       Height:       67.0 in Accession #:    [id number]      Weight:       380.0 lb Date of Birth:  1986-03-11       BSA:          2.656 m Patient Age:    34 years        BP:           146/84 mmHg Patient Gender: F               HR:           55 bpm. Exam Location:  Inpatient Procedure: Limited Echo, Cardiac Doppler and Color Doppler Indications:    CHF-Acute Diastolic  History:        Patient has no prior history of Echocardiogram examinations.  Risk Factors:Hypertension, Diabetes and Current Smoker.                 COVID-19.  Sonographer:    Ross Ludwig RDCS (AE) Referring Phys: [id number] [place]  Sonographer Comments: No subcostal window and patient is morbidly obese. Image acquisition challenging due to patient body habitus. COVID-19 IMPRESSIONS  1. Left ventricular ejection fraction, by estimation, is 60 to 65%. The left ventricle has normal function. The left ventricle has no regional wall motion abnormalities. There is  moderate left ventricular hypertrophy. Left ventricular diastolic parameters were normal.  2. Right ventricular systolic function is normal. The right ventricular size is normal.  3. The mitral valve is grossly normal. Trivial mitral valve regurgitation.  4. The aortic valve is tricuspid. Aortic valve regurgitation is mild. Mild aortic valve sclerosis is present, with no evidence of aortic valve stenosis. Aortic regurgitation PHT measures 480 msec.  5. The inferior vena cava is normal in size with <50% respiratory variability, suggesting right atrial pressure of 8 mmHg. Comparison(s): No prior Echocardiogram. FINDINGS  Left Ventricle: Left ventricular ejection fraction, by estimation, is 60 to 65%. The left ventricle has normal function. The left ventricle has no regional wall motion abnormalities. The left ventricular internal cavity size was normal in size. There is  moderate left ventricular hypertrophy. Left ventricular diastolic parameters were normal. Indeterminate filling pressures. Right Ventricle: The right ventricular size is normal. No increase in right ventricular wall thickness. Right ventricular systolic function is normal. Left Atrium: Left atrial size was normal in size. Right Atrium: Right atrial size was normal in size. Pericardium: There is no evidence of pericardial effusion. Mitral Valve: The mitral valve is grossly normal. Trivial mitral valve regurgitation. Tricuspid Valve: The tricuspid valve is grossly normal. Tricuspid valve regurgitation is trivial. Aortic Valve: The aortic valve is tricuspid. Aortic valve regurgitation is mild. Aortic regurgitation PHT measures 480 msec. Mild aortic valve sclerosis is present, with no evidence of aortic valve stenosis. Pulmonic Valve: The pulmonic valve was normal in structure. Pulmonic valve regurgitation is not visualized. Aorta: The aortic root and ascending aorta are structurally normal, with no evidence of dilitation. Venous: The inferior vena cava  is normal in size with less than 50% respiratory variability, suggesting right atrial pressure of 8 mmHg. IAS/Shunts: No atrial level shunt detected by color flow Doppler.  LEFT VENTRICLE PLAX 2D LVIDd:         4.70 cm  Diastology LVIDs:         3.10 cm  LV e' medial:    9.03 cm/s LV PW:         1.50 cm  LV E/e' medial:  10.8 LV IVS:        1.60 cm  LV e' lateral:   9.25 cm/s LVOT diam:     2.00 cm  LV E/e' lateral: 10.5 LVOT Area:     3.14 cm  IVC IVC diam: 1.70 cm LEFT ATRIUM         Index LA diam:    3.20 cm 1.20 cm/m  AORTIC VALVE AI PHT:      480 msec  AORTA Ao Root diam: 3.10 cm MITRAL VALVE MV Area (PHT): 3.27 cm    SHUNTS MV Decel Time: 232 msec    Systemic Diam: 2.00 cm MV E velocity: 97.30 cm/s MV A velocity: 58.30 cm/s MV E/A ratio:  1.67 Zoila Shutter MD Electronically signed by Zoila Shutter MD Signature Date/Time: 08/02/2020/12:35:41 PM    Final  Assessment & Plan:   Pneumonia due to COVID-19 virus Acute Hypoxic Respiratory Failure:  Patient walked in office today O2 sats dropped to 89% while ambulating, but recovered quickly with rest. May stay at room air. Please get pulse ox to check O2 sats while ambulating at home  Concern for possible sleep apnea per hospital note - will place referral to pulmonary  Will order symbicort - history of asthma  May continue albuterol as needed  Stay well hydrated  Stay active  Deep breathing exercises  May start vitamin C 2,000 mg daily, vitamin D3 2,000 IU daily, Zinc 220 mg daily, and Quercetin 500 mg twice daily  May take tylenol or fever or pain  May take mucinex DM twice daily  Will order chest x ray  Will order labs   Hypertension:  Please follow up with PCP   Follow up:  Follow up in 1 month and as needed      Ivonne Andrew, NP 08/16/2020

## 2020-08-16 NOTE — Patient Instructions (Addendum)
Covid pneumonia Acute Hypoxic Respiratory Failure:  Patient walked in office today O2 sats dropped to 89% while ambulating, but recovered quickly with rest. May stay at room air. Please get pulse ox to check O2 sats while ambulating at home  Concern for possible sleep apnea per hospital note - will place referral to pulmonary  Will order symbicort - history of asthma  May continue albuterol as needed  Stay well hydrated  Stay active  Deep breathing exercises  May start vitamin C 2,000 mg daily, vitamin D3 2,000 IU daily, Zinc 220 mg daily, and Quercetin 500 mg twice daily  May take tylenol or fever or pain  May take mucinex DM twice daily  Will order chest x ray  Will order labs   Hypertension:  Please follow up with PCP   Follow up:  Follow up in 1 month and as needed

## 2020-08-17 LAB — CBC
Hematocrit: 34.8 % (ref 34.0–46.6)
Hemoglobin: 10.9 g/dL — ABNORMAL LOW (ref 11.1–15.9)
MCH: 25.2 pg — ABNORMAL LOW (ref 26.6–33.0)
MCHC: 31.3 g/dL — ABNORMAL LOW (ref 31.5–35.7)
MCV: 81 fL (ref 79–97)
Platelets: 459 10*3/uL — ABNORMAL HIGH (ref 150–450)
RBC: 4.32 x10E6/uL (ref 3.77–5.28)
RDW: 16.4 % — ABNORMAL HIGH (ref 11.7–15.4)
WBC: 8.2 10*3/uL (ref 3.4–10.8)

## 2020-08-17 LAB — COMPREHENSIVE METABOLIC PANEL
ALT: 14 IU/L (ref 0–32)
AST: 15 IU/L (ref 0–40)
Albumin/Globulin Ratio: 1.3 (ref 1.2–2.2)
Albumin: 3.5 g/dL — ABNORMAL LOW (ref 3.8–4.8)
Alkaline Phosphatase: 83 IU/L (ref 44–121)
BUN/Creatinine Ratio: 13 (ref 9–23)
BUN: 10 mg/dL (ref 6–20)
Bilirubin Total: 0.2 mg/dL (ref 0.0–1.2)
CO2: 20 mmol/L (ref 20–29)
Calcium: 8.4 mg/dL — ABNORMAL LOW (ref 8.7–10.2)
Chloride: 107 mmol/L — ABNORMAL HIGH (ref 96–106)
Creatinine, Ser: 0.79 mg/dL (ref 0.57–1.00)
GFR calc Af Amer: 111 mL/min/{1.73_m2} (ref >59)
GFR calc non Af Amer: 97 mL/min/{1.73_m2} (ref >59)
Globulin, Total: 2.6 g/dL (ref 1.5–4.5)
Glucose: 97 mg/dL (ref 65–99)
Potassium: 4.2 mmol/L (ref 3.5–5.2)
Sodium: 140 mmol/L (ref 134–144)
Total Protein: 6.1 g/dL (ref 6.0–8.5)

## 2020-08-18 ENCOUNTER — Ambulatory Visit
Admission: RE | Admit: 2020-08-18 | Discharge: 2020-08-18 | Disposition: A | Discharge status: Home / Self Care | Payer: Medicaid Other | Source: Ambulatory Visit | Attending: Nurse Practitioner | Admitting: Nurse Practitioner

## 2020-08-19 ENCOUNTER — Encounter: Payer: Self-pay | Admitting: Nurse Practitioner

## 2020-09-06 ENCOUNTER — Ambulatory Visit: Payer: Medicaid Other

## 2020-09-07 ENCOUNTER — Ambulatory Visit (INDEPENDENT_AMBULATORY_CARE_PROVIDER_SITE_OTHER): Payer: HRSA Program | Admitting: Pulmonary Disease

## 2020-09-07 ENCOUNTER — Encounter: Payer: Self-pay | Admitting: Pulmonary Disease

## 2020-09-07 ENCOUNTER — Other Ambulatory Visit: Payer: Self-pay

## 2020-09-07 VITALS — BP 130/82 | HR 114 | Temp 98.2°F | Ht 67.0 in | Wt 380.2 lb

## 2020-09-07 DIAGNOSIS — G478 Other sleep disorders: Secondary | ICD-10-CM

## 2020-09-07 DIAGNOSIS — Z8616 Personal history of COVID-19: Secondary | ICD-10-CM

## 2020-09-07 DIAGNOSIS — R0602 Shortness of breath: Secondary | ICD-10-CM

## 2020-09-07 NOTE — Patient Instructions (Signed)
Negative study for sleep apnea in the past  Not a lot of symptoms presently to suggest sleep apnea although this is possible if you have been witnessed to hold your breath or snore significantly while asleep  Review symptoms of sleep apnea as best as you can A study can be ordered if you think this may be going on with you  You need to get at least 6 to 8 hours of sleep to feel rejuvenated  Call with significant concerns  I will see you as needed   Sleep Apnea Sleep apnea is a condition in which breathing pauses or becomes shallow during sleep. Episodes of sleep apnea usually last 10 seconds or longer, and they may occur as many as 20 times an hour. Sleep apnea disrupts your sleep and keeps your body from getting the rest that it needs. This condition can increase your risk of certain health problems, including:  Heart attack.  Stroke.  Obesity.  Diabetes.  Heart failure.  Irregular heartbeat. What are the causes? There are three kinds of sleep apnea:  Obstructive sleep apnea. This kind is caused by a blocked or collapsed airway.  Central sleep apnea. This kind happens when the part of the brain that controls breathing does not send the correct signals to the muscles that control breathing.  Mixed sleep apnea. This is a combination of obstructive and central sleep apnea. The most common cause of this condition is a collapsed or blocked airway. An airway can collapse or become blocked if:  Your throat muscles are abnormally relaxed.  Your tongue and tonsils are larger than normal.  You are overweight.  Your airway is smaller than normal. What increases the risk? You are more likely to develop this condition if you:  Are overweight.  Smoke.  Have a smaller than normal airway.  Are elderly.  Are female.  Drink alcohol.  Take sedatives or tranquilizers.  Have a family history of sleep apnea. What are the signs or symptoms? Symptoms of this condition  include:  Trouble staying asleep.  Daytime sleepiness and tiredness.  Irritability.  Loud snoring.  Morning headaches.  Trouble concentrating.  Forgetfulness.  Decreased interest in sex.  Unexplained sleepiness.  Mood swings.  Personality changes.  Feelings of depression.  Waking up often during the night to urinate.  Dry mouth.  Sore throat. How is this diagnosed? This condition may be diagnosed with:  A medical history.  A physical exam.  A series of tests that are done while you are sleeping (sleep study). These tests are usually done in a sleep lab, but they may also be done at home. How is this treated? Treatment for this condition aims to restore normal breathing and to ease symptoms during sleep. It may involve managing health issues that can affect breathing, such as high blood pressure or obesity. Treatment may include:  Sleeping on your side.  Using a decongestant if you have nasal congestion.  Avoiding the use of depressants, including alcohol, sedatives, and narcotics.  Losing weight if you are overweight.  Making changes to your diet.  Quitting smoking.  Using a device to open your airway while you sleep, such as: ? An oral appliance. This is a custom-made mouthpiece that shifts your lower jaw forward. ? A continuous positive airway pressure (CPAP) device. This device blows air through a mask when you breathe out (exhale). ? A nasal expiratory positive airway pressure (EPAP) device. This device has valves that you put into each nostril. ? A  bi-level positive airway pressure (BPAP) device. This device blows air through a mask when you breathe in (inhale) and breathe out (exhale).  Having surgery if other treatments do not work. During surgery, excess tissue is removed to create a wider airway. It is important to get treatment for sleep apnea. Without treatment, this condition can lead to:  High blood pressure.  Coronary artery  disease.  In men, an inability to achieve or maintain an erection (impotence).  Reduced thinking abilities. Follow these instructions at home: Lifestyle  Make any lifestyle changes that your health care provider recommends.  Eat a healthy, well-balanced diet.  Take steps to lose weight if you are overweight.  Avoid using depressants, including alcohol, sedatives, and narcotics.  Do not use any products that contain nicotine or tobacco, such as cigarettes, e-cigarettes, and chewing tobacco. If you need help quitting, ask your health care provider. General instructions  Take over-the-counter and prescription medicines only as told by your health care provider.  If you were given a device to open your airway while you sleep, use it only as told by your health care provider.  If you are having surgery, make sure to tell your health care provider you have sleep apnea. You may need to bring your device with you.  Keep all follow-up visits as told by your health care provider. This is important. Contact a health care provider if:  The device that you received to open your airway during sleep is uncomfortable or does not seem to be working.  Your symptoms do not improve.  Your symptoms get worse. Get help right away if:  You develop: ? Chest pain. ? Shortness of breath. ? Discomfort in your back, arms, or stomach.  You have: ? Trouble speaking. ? Weakness on one side of your body. ? Drooping in your face. These symptoms may represent a serious problem that is an emergency. Do not wait to see if the symptoms will go away. Get medical help right away. Call your local emergency services (911 in the [place] Do not drive yourself to the hospital. Summary  Sleep apnea is a condition in which breathing pauses or becomes shallow during sleep.  The most common cause is a collapsed or blocked airway.  The goal of treatment is to restore normal breathing and to ease symptoms during  sleep. This information is not intended to replace advice given to you by your health care provider. Make sure you discuss any questions you have with your health care provider. Document Revised: 04/23/2019 Document Reviewed: 07/02/2018 Elsevier Patient Education  2020 [employer].

## 2020-09-07 NOTE — Progress Notes (Signed)
Lacey Whitaker    [id number]    11/15/1986  Primary Care Physician:Bates, Rocky Crafts, FNP  Referring Physician: Ivonne Andrew, NP [address] [place],  [place] [address]  Chief complaint:   Patient being seen for possible obstructive sleep apnea  HPI:  Recent hospitalization for Covid, respiratory failure Symptoms currently improving  Denies a history of snoring Denies history of witnessed apneas  Sleep study over 15 years ago was negative for sleep apnea  Usually goes to bed between 2 and 3 AM Sometimes takes hours to fall asleep Usually about 1 awakening Has to wake up to get daughter going in the morning She then go back to sleep afterwards  Does wake up tired Weight is up about 11 pounds recently  Never really been told about snoring or witnessed apneas  Occasional gasping respirations which she is only noticed since she got sick recently, this is improving  History of asthma, hypertension  Social smoker  Brother had obstructive sleep apnea  Outpatient Encounter Medications as of 09/07/2020  Medication Sig  . albuterol (PROVENTIL HFA) 108 (90 Base) MCG/ACT inhaler Inhale 2 puffs into the lungs every 6 (six) hours as needed for wheezing or shortness of breath.  . budesonide-formoterol (SYMBICORT) 80-4.5 MCG/ACT inhaler Inhale 2 puffs into the lungs 2 (two) times daily.  Marland Kitchen levonorgestrel (MIRENA) 20 MCG/24HR IUD 1 each by Intrauterine route once.  Marland Kitchen omeprazole (PRILOSEC) 20 MG capsule Take 1 capsule (20 mg total) by mouth daily.  Marland Kitchen tiZANidine (ZANAFLEX) 2 MG tablet Take 2 mg by mouth every 6 (six) hours as needed for muscle spasms.  . [DISCONTINUED] diclofenac Sodium (VOLTAREN) 1 % GEL Apply 4 g topically 4 (four) times daily.  Marland Kitchen lisinopril-hydrochlorothiazide (ZESTORETIC) 20-12.5 MG tablet Take 2 tablets by mouth daily.  . Vitamin D, Ergocalciferol, (DRISDOL) 1.25 MG (50000 UT) CAPS capsule Take 50,000 Units by mouth every Monday. (Patient not taking:  Reported on 09/07/2020)   No facility-administered encounter medications on file as of 09/07/2020.    Allergies as of 09/07/2020  . (No Known Allergies)    Past Medical History:  Diagnosis Date  . Asthma   . Cyst, mediastinum    pt. reports having the cyst removed last year and has reoccured  . Diabetes mellitus type II, controlled (HCC) 01/27/2020  . Iron deficiency anemia due to chronic blood loss   . Morbid obesity (HCC)    s/p gastric bypass surgery in 2009  . Pregnancy induced hypertension    2006    Past Surgical History:  Procedure Laterality Date  . BONE CYST EXCISION  2010  . CESAREAN SECTION    . CESAREAN SECTION  09/02/2011   Procedure: CESAREAN SECTION;  Surgeon: Purcell Nails, MD;  Location: [facility];  Service: Gynecology;  Laterality: N/A;  . GASTRIC BYPASS  05/2008  . GASTRIC BYPASS  2009  . WISDOM TOOTH EXTRACTION  09/2010    Family History  Problem Relation Age of Onset  . Asthma Mother   . Asthma Brother     Social History   Socioeconomic History  . Marital status: Single    Spouse name: Not on file  . Number of children: 3  . Years of education: Not on file  . Highest education level: Not on file  Occupational History  . Not on file  Tobacco Use  . Smoking status: Current Some Day Smoker    Types: Cigars  . Smokeless tobacco: Never Used  .  Tobacco comment: Smokes 2 Black & Milds daily  Vaping Use  . Vaping Use: Never used  Substance and Sexual Activity  . Alcohol use: Yes    Comment: occ  . Drug use: No  . Sexual activity: Yes    Birth control/protection: I.U.D.  Other Topics Concern  . Not on file  Social History Narrative   Works at [employer].   3 children: Ages 40,6,2 y/o    Lives with her Mom, her brother, and her 3 children.       Social Determinants of Health   Financial Resource Strain:   . Difficulty of Paying Living Expenses: Not on file  Food Insecurity:   . Worried About Programme researcher, broadcasting/film/video in the Last  Year: Not on file  . Ran Out of Food in the Last Year: Not on file  Transportation Needs:   . Lack of Transportation (Medical): Not on file  . Lack of Transportation (Non-Medical): Not on file  Physical Activity:   . Days of Exercise per Week: Not on file  . Minutes of Exercise per Session: Not on file  Stress:   . Feeling of Stress : Not on file  Social Connections:   . Frequency of Communication with Friends and Family: Not on file  . Frequency of Social Gatherings with Friends and Family: Not on file  . Attends Religious Services: Not on file  . Active Member of Clubs or Organizations: Not on file  . Attends Banker Meetings: Not on file  . Marital Status: Not on file  Intimate Partner Violence:   . Fear of Current or Ex-Partner: Not on file  . Emotionally Abused: Not on file  . Physically Abused: Not on file  . Sexually Abused: Not on file    Review of Systems  Respiratory: Negative for apnea.   Psychiatric/Behavioral: Positive for sleep disturbance.  All other systems reviewed and are negative.   Vitals:   09/07/20 1331  BP: 130/82  Pulse: (!) 114  Temp: 98.2 F (36.8 C)  SpO2: 98%     Physical Exam Constitutional:      Appearance: She is obese.  HENT:     Nose: No congestion or rhinorrhea.     Mouth/Throat:     Comments: Mallampati 3, crowded oropharynx, macroglossia Eyes:     General:        Right eye: No discharge.        Left eye: No discharge.  Cardiovascular:     Rate and Rhythm: Normal rate and regular rhythm.     Heart sounds: No murmur heard.  No friction rub.  Pulmonary:     Effort: No respiratory distress.     Breath sounds: No stridor. No wheezing or rhonchi.  Musculoskeletal:     Cervical back: No rigidity or tenderness.  Neurological:     Mental Status: She is alert.    Results of the Epworth flowsheet 09/07/2020  Sitting and reading 0  Watching TV 1  Sitting, inactive in a public place (e.g. a theatre or a meeting) 0    As a passenger in a car for an hour without a break 0  Lying down to rest in the afternoon when circumstances permit 1  Sitting and talking to someone 0  Sitting quietly after a lunch without alcohol 0  In a car, while stopped for a few minutes in traffic 0  Total score 2   Data Reviewed: Recent lab data reviewed Echocardiogram  08/02/2020-within normal limits Chest x-ray 08/02/2020 with bilateral infiltrates   Assessment:  Recent diagnosis of Pneumonia   Recent respiratory failure  Inadequate sleep  Recent concern for obstructive sleep apnea -Patient relates this to recent respiratory illness -She states symptoms are better since gradual recovery -Denies symptoms that were to be 5 presence of sleep disordered breathing -Body habitus is suggestive and is concerning for presence of sleep disordered breathing  Pathophysiology of sleep disordered breathing discussed Treatment options reviewed  Plan/Recommendations: As patient is not having significant symptoms at the present time, recovering from a recent respiratory illness  We will not order any testing at present  Risks with untreated sleep disordered breathing discussed  Patient encouraged to obtain more information regarding sleep disordered breathing  Encouraged to get at least 6 to 8 hours of sleep on a regular basis  I did provide some reading material for sleep disordered breathing  Weight loss measures encouraged  I will see her as needed   Virl Diamond MD [facility] 09/07/2020, 1:47 PM  CC: Ivonne Andrew, NP

## 2020-09-20 ENCOUNTER — Ambulatory Visit (INDEPENDENT_AMBULATORY_CARE_PROVIDER_SITE_OTHER): Payer: HRSA Program | Admitting: Nurse Practitioner

## 2020-09-20 ENCOUNTER — Other Ambulatory Visit: Payer: Self-pay | Admitting: Nurse Practitioner

## 2020-09-20 VITALS — BP 140/88 | HR 86 | Temp 97.7°F | Ht 67.0 in | Wt 380.0 lb

## 2020-09-20 DIAGNOSIS — R5381 Other malaise: Secondary | ICD-10-CM | POA: Diagnosis not present

## 2020-09-20 DIAGNOSIS — J1282 Pneumonia due to coronavirus disease 2019: Secondary | ICD-10-CM

## 2020-09-20 DIAGNOSIS — U071 COVID-19: Secondary | ICD-10-CM

## 2020-09-20 DIAGNOSIS — J9601 Acute respiratory failure with hypoxia: Secondary | ICD-10-CM | POA: Diagnosis not present

## 2020-09-20 DIAGNOSIS — Z8616 Personal history of COVID-19: Secondary | ICD-10-CM | POA: Insufficient documentation

## 2020-09-20 MED ORDER — BUDESONIDE-FORMOTEROL FUMARATE 80-4.5 MCG/ACT IN AERO: 2.0000 | INHALATION_SPRAY | Freq: Two times a day (BID) | RESPIRATORY_TRACT | 3 refills | Status: DC

## 2020-09-20 MED ORDER — MONTELUKAST SODIUM 10 MG PO TABS: 10.0000 mg | ORAL_TABLET | Freq: Every day | ORAL | 3 refills | Status: DC

## 2020-09-20 MED ORDER — ALBUTEROL SULFATE HFA 108 (90 BASE) MCG/ACT IN AERS: 2.0000 | INHALATION_SPRAY | Freq: Four times a day (QID) | RESPIRATORY_TRACT | 0 refills | Status: DC | PRN

## 2020-09-20 MED FILL — ALBUTEROL SULFATE HFA 108 (: 108 (90 BAS | 25 days supply | Qty: 18 | Fill #0

## 2020-09-20 MED FILL — MONTELUKAST SOD 10 MG TAB: 10 | 30 days supply | Qty: 30 | Fill #0

## 2020-09-20 MED FILL — !SYMBICORT 80-4.5 MCG INH: 80-4.5 | 30 days supply | Qty: 1 | Fill #0

## 2020-09-20 NOTE — Patient Instructions (Signed)
Pneumonia due to COVID-19 virus Acute Hypoxic Respiratory Failure:  Will order symbicort - history of asthma  May continue albuterol as needed  Stay well hydrated  Stay active  Deep breathing exercises  May take tylenol or fever or pain  Will order chest x ray     Hypertension:  Please follow up with PCP   Follow up:  Follow up in 3 months or as needed

## 2020-09-20 NOTE — Progress Notes (Signed)
@Patient  ID: , female    DOB: July 16, 1986, 34 y.o.   MRN: [id number]  Chief Complaint  Patient presents with  . Follow-up    Still SOB, needs refill on inhaler    Referring provider: [id number], FNP   34 year old female with history of mild intermittent asthma, hypertension, and morbid obesity, iron deficiency anemia.  Diagnosed with Covid on 07/24/2020.  HPI Patient presents today for post COVID care clinic visit follow-up.  At last visit patient was ordered Symbicort inhaler.  She was not able to get this due to financial concerns.  She states that her albuterol inhaler needs to be refilled.  She states that she is slowly improving.  She does need repeat imaging today.  We discussed that we might consider in [facility] prescriptions [facility] and hopefully she could afford them.  Patient states that she is still getting short of breath with exertion.  She does have a history of asthma. Denies f/c/s, n/v/d, hemoptysis, PND, chest pain or edema.       No Known Allergies  Immunization History  Administered Date(s) Administered  . Influenza,inj,Quad PF,6+ Mos 01/19/2020  . Tdap 09/04/2011, 02/06/2013    Past Medical History:  Diagnosis Date  . Asthma   . Cyst, mediastinum    pt. reports having the cyst removed last year and has reoccured  . Diabetes mellitus type II, controlled (HCC) 01/27/2020  . Iron deficiency anemia due to chronic blood loss   . Morbid obesity (HCC)    s/p gastric bypass surgery in 2009  . Pregnancy induced hypertension    2006    Tobacco History: Social History   Tobacco Use  Smoking Status Current Some Day Smoker  . Types: Cigars  Smokeless Tobacco Never Used  Tobacco Comment   Smokes 2 Black & Milds daily   Ready to quit: Not Answered Counseling given: Not Answered Comment: Smokes 2 Black & Milds daily   Outpatient Encounter Medications as of 09/20/2020  Medication Sig  . albuterol (PROVENTIL HFA) 108 (90  Base) MCG/ACT inhaler Inhale 2 puffs into the lungs every 6 (six) hours as needed for up to 8 days for wheezing or shortness of breath.  . levonorgestrel (MIRENA) 20 MCG/24HR IUD 1 each by Intrauterine route once.  13/11/2019 omeprazole (PRILOSEC) 20 MG capsule Take 1 capsule (20 mg total) by mouth daily.  Marland Kitchen tiZANidine (ZANAFLEX) 2 MG tablet Take 2 mg by mouth every 6 (six) hours as needed for muscle spasms.  . Vitamin D, Ergocalciferol, (DRISDOL) 1.25 MG (50000 UT) CAPS capsule Take 50,000 Units by mouth every Monday.   . [DISCONTINUED] albuterol (PROVENTIL HFA) 108 (90 Base) MCG/ACT inhaler Inhale 2 puffs into the lungs every 6 (six) hours as needed for wheezing or shortness of breath.  . budesonide-formoterol (SYMBICORT) 80-4.5 MCG/ACT inhaler Inhale 2 puffs into the lungs 2 (two) times daily.  Monday lisinopril-hydrochlorothiazide (ZESTORETIC) 20-12.5 MG tablet Take 2 tablets by mouth daily.  . montelukast (SINGULAIR) 10 MG tablet Take 1 tablet (10 mg total) by mouth at bedtime.  . [DISCONTINUED] budesonide-formoterol (SYMBICORT) 80-4.5 MCG/ACT inhaler Inhale 2 puffs into the lungs 2 (two) times daily. (Patient not taking: Reported on 09/20/2020)   No facility-administered encounter medications on file as of 09/20/2020.     Review of Systems  Review of Systems  Constitutional: Negative.  Negative for fatigue and fever.  HENT: Negative.   Respiratory: Positive for shortness of breath. Negative for cough.   Cardiovascular: Negative.  Negative for chest pain, palpitations and leg swelling.  Gastrointestinal: Negative.   Allergic/Immunologic: Negative.   Neurological: Negative.   Psychiatric/Behavioral: Negative.        Physical Exam  BP 140/88   Pulse 86   Temp 97.7 F (36.5 C)   Ht 5\' 7"  (1.702 m)   Wt (!) 380 lb 0.1 oz (172.4 kg)   SpO2 95%   BMI 59.52 kg/m   Wt Readings from Last 5 Encounters:  09/20/20 (!) 380 lb 0.1 oz (172.4 kg)  09/07/20 (!) 380 lb 3.2 oz (172.5 kg)  08/16/20 (!)  381 lb 0.1 oz (172.8 kg)  07/24/20 (!) 380 lb (172.4 kg)  03/28/20 (!) 375 lb 10.6 oz (170.4 kg)     Physical Exam Vitals and nursing note reviewed.  Constitutional:      General: She is not in acute distress.    Appearance: She is well-developed.  Cardiovascular:     Rate and Rhythm: Normal rate and regular rhythm.  Pulmonary:     Effort: Pulmonary effort is normal.     Breath sounds: Normal breath sounds.  Musculoskeletal:     Right lower leg: No edema.     Left lower leg: No edema.  Neurological:     Mental Status: She is alert and oriented to person, place, and time.  Psychiatric:        Mood and Affect: Mood normal.        Behavior: Behavior normal.        Assessment & Plan:   History of COVID-19 Acute Hypoxic Respiratory Failure:  Will order symbicort - history of asthma  May continue albuterol as needed  Stay well hydrated  Stay active  Deep breathing exercises  May take tylenol or fever or pain  Will order chest x ray     Hypertension:  Please follow up with PCP   Follow up:  Follow up in 3 months or as needed      05/28/20, NP 09/20/2020

## 2020-09-20 NOTE — Assessment & Plan Note (Signed)
Acute Hypoxic Respiratory Failure:  Will order symbicort - history of asthma  May continue albuterol as needed  Stay well hydrated  Stay active  Deep breathing exercises  May take tylenol or fever or pain  Will order chest x ray     Hypertension:  Please follow up with PCP   Follow up:  Follow up in 3 months or as needed

## 2020-09-22 ENCOUNTER — Ambulatory Visit
Admission: RE | Admit: 2020-09-22 | Discharge: 2020-09-22 | Disposition: A | Discharge status: Home / Self Care | Payer: Medicaid Other | Source: Ambulatory Visit | Attending: Nurse Practitioner | Admitting: Nurse Practitioner

## 2020-09-22 ENCOUNTER — Other Ambulatory Visit: Payer: Self-pay

## 2020-09-22 NOTE — Addendum Note (Signed)
Addended by: Ivonne Andrew on: 09/22/2020 03:40 PM   Modules accepted: Orders

## 2020-09-24 ENCOUNTER — Telehealth: Payer: Self-pay | Admitting: Nurse Practitioner

## 2020-09-24 NOTE — Telephone Encounter (Signed)
-----   Message from Ivonne Andrew, NP sent at 09/24/2020  8:13 AM EDT ----- Please call to let patient know that her xray is showing continued improvement. Thanks.

## 2020-09-24 NOTE — Telephone Encounter (Signed)
Voicemail full, unable to contact patient.

## 2020-10-12 ENCOUNTER — Ambulatory Visit: Payer: Medicaid Other | Admitting: Physical Therapy

## 2020-10-20 ENCOUNTER — Ambulatory Visit: Payer: Self-pay

## 2020-10-21 ENCOUNTER — Ambulatory Visit (INDEPENDENT_AMBULATORY_CARE_PROVIDER_SITE_OTHER): Payer: HRSA Program | Admitting: Nurse Practitioner

## 2020-10-21 VITALS — BP 128/82 | HR 91 | Temp 97.7°F | Ht 67.0 in | Wt 378.0 lb

## 2020-10-21 DIAGNOSIS — Z8616 Personal history of COVID-19: Secondary | ICD-10-CM

## 2020-10-21 MED ORDER — ALBUTEROL SULFATE HFA 108 (90 BASE) MCG/ACT IN AERS: 2.0000 | INHALATION_SPRAY | Freq: Four times a day (QID) | RESPIRATORY_TRACT | 0 refills | Status: DC | PRN

## 2020-10-21 MED ORDER — IBUPROFEN 600 MG PO TABS: 600.0000 mg | ORAL_TABLET | Freq: Three times a day (TID) | ORAL | 0 refills | Status: DC | PRN

## 2020-10-21 MED FILL — IBUPROFEN 600 MG TABLET: 600 | 10 days supply | Qty: 30 | Fill #0

## 2020-10-21 MED FILL — ALBUTEROL SULFATE HFA 108 (: 108 (90 BAS | 25 days supply | Qty: 18 | Fill #0

## 2020-10-21 NOTE — Assessment & Plan Note (Signed)
Stay well hydrated  Stay active  Deep breathing exercises  Will order chest x ray  May continue Symbicort   Follow up:  Follow up  if needed

## 2020-10-21 NOTE — Patient Instructions (Addendum)
History of Covid 19:   Stay well hydrated  Stay active  Deep breathing exercises  Will order chest x ray  May continue Symbicort   Follow up:  Follow up  if needed

## 2020-10-21 NOTE — Progress Notes (Signed)
@Patient  ID: , female    DOB: [birth year], 34 y.o.   MRN: [id number]  Chief Complaint  Patient presents with  . Follow-up    No questions or concerns.    Referring provider: [id number], FNP   34 year old female with history of mild intermittent asthma, hypertension, and morbid obesity, iron deficiency anemia. Diagnosed with Covid on 07/24/2020.  HPI   Patient presents today for post COVID care clinic visit.  She was last seen here on 09/20/2020.  She was ordered Symbicort.  She states that she was able to get this medication and that her symptoms have improved.  She is no longer having significant shortness of breath.  Her last chest x-ray did show clearing pneumonia.  She will get repeat imaging today.  She states that she is ready to go back to work.  She would like to get refills on her albuterol and Symbicort inhaler. Denies f/c/s, n/v/d, hemoptysis, PND, chest pain or edema.      No Known Allergies  Immunization History  Administered Date(s) Administered  . Influenza,inj,Quad PF,6+ Mos 01/19/2020  . Tdap 09/04/2011, 02/06/2013    Past Medical History:  Diagnosis Date  . Asthma   . Cyst, mediastinum    pt. reports having the cyst removed last year and has reoccured  . Diabetes mellitus type II, controlled (HCC) 01/27/2020  . Iron deficiency anemia due to chronic blood loss   . Morbid obesity (HCC)    s/p gastric bypass surgery in 2009  . Pregnancy induced hypertension    2006    Tobacco History: Social History   Tobacco Use  Smoking Status Current Some Day Smoker  . Types: Cigars  Smokeless Tobacco Never Used  Tobacco Comment   Smokes 2 Black & Milds daily   Ready to quit: Not Answered Counseling given: Not Answered Comment: Smokes 2 Black & Milds daily   Outpatient Encounter Medications as of 10/21/2020  Medication Sig  . budesonide-formoterol (SYMBICORT) 80-4.5 MCG/ACT inhaler Inhale 2 puffs into the lungs 2 (two) times daily.  14/12/2019  levonorgestrel (MIRENA) 20 MCG/24HR IUD 1 each by Intrauterine route once.  . montelukast (SINGULAIR) 10 MG tablet Take 1 tablet (10 mg total) by mouth at bedtime.  Marland Kitchen omeprazole (PRILOSEC) 20 MG capsule Take 1 capsule (20 mg total) by mouth daily.  Marland Kitchen tiZANidine (ZANAFLEX) 2 MG tablet Take 2 mg by mouth every 6 (six) hours as needed for muscle spasms.  . Vitamin D, Ergocalciferol, (DRISDOL) 1.25 MG (50000 UT) CAPS capsule Take 50,000 Units by mouth every Monday.   Sunday albuterol (PROVENTIL HFA) 108 (90 Base) MCG/ACT inhaler Inhale 2 puffs into the lungs every 6 (six) hours as needed for up to 8 days for wheezing or shortness of breath.  Marland Kitchen ibuprofen (ADVIL) 600 MG tablet Take 1 tablet (600 mg total) by mouth every 8 (eight) hours as needed.  Marland Kitchen lisinopril-hydrochlorothiazide (ZESTORETIC) 20-12.5 MG tablet Take 2 tablets by mouth daily.  . [DISCONTINUED] albuterol (PROVENTIL HFA) 108 (90 Base) MCG/ACT inhaler Inhale 2 puffs into the lungs every 6 (six) hours as needed for up to 8 days for wheezing or shortness of breath.   No facility-administered encounter medications on file as of 10/21/2020.     Review of Systems  Review of Systems  Constitutional: Negative.  Negative for fever.  HENT: Negative.   Respiratory: Negative for cough and shortness of breath.   Cardiovascular: Negative.  Negative for chest pain, palpitations and leg swelling.  Gastrointestinal: Negative.  Allergic/Immunologic: Negative.   Neurological: Negative.   Psychiatric/Behavioral: Negative.        Physical Exam  BP 128/82 (BP Location: Right Arm)   Pulse 91   Temp 97.7 F (36.5 C)   Ht 5\' 7"  (1.702 m)   Wt (!) 378 lb (171.5 kg)   SpO2 98%   BMI 59.20 kg/m   Wt Readings from Last 5 Encounters:  10/21/20 (!) 378 lb (171.5 kg)  09/20/20 (!) 380 lb 0.1 oz (172.4 kg)  09/07/20 (!) 380 lb 3.2 oz (172.5 kg)  08/16/20 (!) 381 lb 0.1 oz (172.8 kg)  07/24/20 (!) 380 lb (172.4 kg)     Physical Exam Vitals and  nursing note reviewed.  Constitutional:      General: She is not in acute distress.    Appearance: She is well-developed.  Cardiovascular:     Rate and Rhythm: Normal rate and regular rhythm.  Pulmonary:     Effort: Pulmonary effort is normal.     Breath sounds: Normal breath sounds.  Musculoskeletal:     Right lower leg: No edema.     Left lower leg: No edema.  Neurological:     Mental Status: She is alert and oriented to person, place, and time.  Psychiatric:        Mood and Affect: Mood normal.        Behavior: Behavior normal.       Imaging: DG Chest 2 View  Result Date: 09/24/2020 CLINICAL DATA:  History of COVID pneumonia, asthma, former smoker EXAM: CHEST - 2 VIEW COMPARISON:  Radiograph 08/18/2020 FINDINGS: Continued improvement in the bilateral airspace opacity seen on comparison imaging with some minimal residual streaky opacity predominantly in the right mid and lower lung and medial left base which could reflect postinfectious/inflammatory sequela. Mildly thickened airways are similar to prior. No pneumothorax or effusion. The cardiomediastinal contours are unremarkable. No acute osseous or soft tissue abnormality. IMPRESSION: Continued improvement in the bilateral airspace opacity seen on comparison imaging with some minimal residual streaky opacity predominantly in the right mid and lower lung and medial left base, favor postinfectious/inflammatory sequela. Electronically Signed   By: 08/20/2020 M.D.   On: 09/24/2020 00:15     Assessment & Plan:   History of COVID-19 Stay well hydrated  Stay active  Deep breathing exercises  Will order chest x ray  May continue Symbicort   Follow up:  Follow up  if needed      June, NP 10/21/2020

## 2020-10-25 ENCOUNTER — Ambulatory Visit
Admission: RE | Admit: 2020-10-25 | Discharge: 2020-10-25 | Disposition: A | Discharge status: Home / Self Care | Payer: Self-pay | Source: Ambulatory Visit | Attending: Nurse Practitioner | Admitting: Nurse Practitioner

## 2020-10-25 ENCOUNTER — Other Ambulatory Visit: Payer: Self-pay

## 2020-10-27 ENCOUNTER — Encounter: Payer: Self-pay | Admitting: Nurse Practitioner

## 2021-03-17 ENCOUNTER — Encounter (HOSPITAL_COMMUNITY): Payer: Self-pay | Admitting: Emergency Medicine

## 2021-03-17 ENCOUNTER — Other Ambulatory Visit: Payer: Self-pay

## 2021-03-17 ENCOUNTER — Ambulatory Visit (HOSPITAL_COMMUNITY)
Admission: EM | Admit: 2021-03-17 | Discharge: 2021-03-17 | Disposition: A | Discharge status: Home / Self Care | Payer: Self-pay | Attending: Internal Medicine | Admitting: Internal Medicine

## 2021-03-17 DIAGNOSIS — L03011 Cellulitis of right finger: Secondary | ICD-10-CM

## 2021-03-17 MED ORDER — DOXYCYCLINE HYCLATE 100 MG PO CAPS: 100.0000 mg | ORAL_CAPSULE | Freq: Two times a day (BID) | ORAL | 0 refills | Status: AC

## 2021-03-17 MED ORDER — IBUPROFEN 600 MG PO TABS: 600.0000 mg | ORAL_TABLET | Freq: Four times a day (QID) | ORAL | 0 refills | Status: DC | PRN

## 2021-03-17 NOTE — Discharge Instructions (Addendum)
Medications as prescribed Warm salt water soaks 3-4 times a day. If you experience worsening pain, swelling, fever and/or chills please return to urgent care to be reevaluated. At this time there is no mature abscess to drain.

## 2021-03-17 NOTE — ED Provider Notes (Signed)
[facility]    CSN: [id number] Arrival date & time: 03/17/21  1007      History   Chief Complaint Chief Complaint  Patient presents with  . Hand Pain    HPI Lacey Whitaker is a 35 y.o. female comes to the urgent care with right index finger pain which started couple of days ago.  Patient says that the pain is currently 8 out of 10, severe and throbbing.  Pain is aggravated by palpation.  Has not had any success with the use of Tylenol.  Patient denies any trauma to the fingers.  Pain is associated with swelling of the fingers.  There is mild erythema of the tip of the index finger. HPI  Past Medical History:  Diagnosis Date  . Asthma   . Cyst, mediastinum    pt. reports having the cyst removed last year and has reoccured  . Diabetes mellitus type II, controlled (HCC) 01/27/2020  . Iron deficiency anemia due to chronic blood loss   . Morbid obesity (HCC)    s/p gastric bypass surgery in 2009  . Pregnancy induced hypertension    2006    Patient Active Problem List   Diagnosis Date Noted  . Physical deconditioning 09/20/2020  . History of COVID-19 09/20/2020  . Pneumonia due to COVID-19 virus 08/16/2020  . Shortness of breath 08/16/2020  . Snoring 08/16/2020  . COVID-19 08/01/2020  . Acute respiratory failure with hypoxia (HCC)   . Diabetes mellitus type II, controlled (HCC) 01/27/2020  . Asthma, chronic   . HTN (hypertension)   . Iron deficiency anemia due to chronic blood loss   . Obesities, morbid (HCC) 08/10/2011    Past Surgical History:  Procedure Laterality Date  . BONE CYST EXCISION  2010  . CESAREAN SECTION    . CESAREAN SECTION  09/02/2011   Procedure: CESAREAN SECTION;  Surgeon: Purcell Nails, MD;  Location: [facility];  Service: Gynecology;  Laterality: N/A;  . GASTRIC BYPASS  05/2008  . GASTRIC BYPASS  2009  . WISDOM TOOTH EXTRACTION  09/2010    OB History    Gravida  3   Para  3   Term  3   Preterm      AB      Living  3      SAB      IAB      Ectopic      Multiple      Live Births  2            Home Medications    Prior to Admission medications   Medication Sig Start Date End Date Taking? Authorizing Provider  budesonide-formoterol (SYMBICORT) 80-4.5 MCG/ACT inhaler INHALE 2 PUFFS INTO THE LUNGS 2 (TWO) TIMES DAILY. 09/20/20 09/20/21 Yes Ivonne Andrew, NP  doxycycline (VIBRAMYCIN) 100 MG capsule Take 1 capsule (100 mg total) by mouth 2 (two) times daily for 5 days. 03/17/21 03/22/21 Yes Sammi Stolarz, Britta Mccreedy, MD  ibuprofen (ADVIL) 600 MG tablet Take 1 tablet (600 mg total) by mouth every 6 (six) hours as needed. 03/17/21  Yes Shayra Anton, Britta Mccreedy, MD  levonorgestrel (MIRENA) 20 MCG/24HR IUD 1 each by Intrauterine route once.   Yes [provider]  montelukast (SINGULAIR) 10 MG tablet TAKE 1 TABLET (10 MG TOTAL) BY MOUTH AT BEDTIME. 09/20/20 09/20/21 Yes Ivonne Andrew, NP  omeprazole (PRILOSEC) 20 MG capsule Take 1 capsule (20 mg total) by mouth daily. 07/24/20  Yes Georgetta Haber, NP  tiZANidine (  ZANAFLEX) 2 MG tablet Take 2 mg by mouth every 6 (six) hours as needed for muscle spasms.   Yes [provider]  Vitamin D, Ergocalciferol, (DRISDOL) 1.25 MG (50000 UT) CAPS capsule Take 50,000 Units by mouth every Monday.    Yes [provider]  albuterol (PROVENTIL HFA) 108 (90 Base) MCG/ACT inhaler Inhale 2 puffs into the lungs every 6 (six) hours as needed for up to 8 days for wheezing or shortness of breath. 10/21/20 10/29/20  Ivonne Andrew, NP  lisinopril-hydrochlorothiazide (ZESTORETIC) 20-12.5 MG tablet Take 2 tablets by mouth daily. 07/12/18 08/01/20  Sudie Grumbling, NP    Family History Family History  Problem Relation Age of Onset  . Asthma Mother   . Asthma Brother     Social History Social History   Tobacco Use  . Smoking status: Current Some Day Smoker    Types: Cigars  . Smokeless tobacco: Never Used  . Tobacco comment: Smokes 2 Black & Milds daily  Vaping Use  .  Vaping Use: Never used  Substance Use Topics  . Alcohol use: Yes    Comment: occ  . Drug use: No     Allergies   Patient has no known allergies.   Review of Systems Review of Systems  Constitutional: Negative.   Genitourinary: Negative.   Musculoskeletal: Positive for arthralgias.  Skin: Negative.   Neurological: Negative.      Physical Exam Triage Vital Signs ED Triage Vitals  Enc Vitals Group     BP 03/17/21 1140 (!) 165/81     Pulse Rate 03/17/21 1140 83     Resp --      Temp 03/17/21 1140 98.8 F (37.1 C)     Temp Source 03/17/21 1140 Oral     SpO2 03/17/21 1140 100 %     Weight --      Height --      Head Circumference --      Peak Flow --      Pain Score 03/17/21 1142 8     Pain Loc --      Pain Edu? --      Excl. in GC? --    No data found.  Updated Vital Signs BP (!) 165/81 (BP Location: Right Arm)   Pulse 83   Temp 98.8 F (37.1 C) (Oral)   SpO2 100%   Visual Acuity Right Eye Distance:   Left Eye Distance:   Bilateral Distance:    Right Eye Near:   Left Eye Near:    Bilateral Near:     Physical Exam Vitals and nursing note reviewed.  Constitutional:      General: She is in acute distress.  Cardiovascular:     Rate and Rhythm: Normal rate and regular rhythm.  Musculoskeletal:        General: Normal range of motion.     Comments: Right index finger tenderness around the distal phalanx.  No fluctuance or erythema.  Slightly warm to touch.  Right index finger is swollen mildly.      UC Treatments / Results  Labs (all labs ordered are listed, but only abnormal results are displayed) Labs Reviewed - No data to display  EKG   Radiology No results found.  Procedures Procedures (including critical care time)  Medications Ordered in UC Medications - No data to display  Initial Impression / Assessment and Plan / UC Course  I have reviewed the triage vital signs and the nursing notes.  Pertinent labs &  imaging results that were  available during my care of the patient were reviewed by me and considered in my medical decision making (see chart for details).     1.  Paronychia of right index finger (no abscess identified): Doxycycline 100 mg twice daily for 5 days Ibuprofen 600 mg every 6 hours as needed for pain Warm salt water soaks If swelling develops into a mature abscess patient advised to return to the urgent care to have incision and drainage done.  Final Clinical Impressions(s) / UC Diagnoses   Final diagnoses:  Paronychia of right index finger     Discharge Instructions     Medications as prescribed Warm salt water soaks 3-4 times a day. If you experience worsening pain, swelling, fever and/or chills please return to urgent care to be reevaluated. At this time there is no mature abscess to drain.   ED Prescriptions    Medication Sig Dispense Auth. Provider   doxycycline (VIBRAMYCIN) 100 MG capsule Take 1 capsule (100 mg total) by mouth 2 (two) times daily for 5 days. 10 capsule Jais Demir, Britta Mccreedy, MD   ibuprofen (ADVIL) 600 MG tablet Take 1 tablet (600 mg total) by mouth every 6 (six) hours as needed. 30 tablet Tommie Bohlken, Britta Mccreedy, MD     PDMP not reviewed this encounter.   Merrilee Jansky, MD 03/17/21 [phone]

## 2021-03-17 NOTE — ED Triage Notes (Signed)
Patient noticed right index finger swelling at tip, no apparent injury.  This morning the finger is very swollen, painful with drainage.  Patient has taken Tylenol for pain.

## 2021-04-06 ENCOUNTER — Other Ambulatory Visit: Payer: Self-pay

## 2021-04-06 ENCOUNTER — Emergency Department (HOSPITAL_COMMUNITY): Payer: Medicaid Other

## 2021-04-06 ENCOUNTER — Emergency Department (HOSPITAL_COMMUNITY)
Admission: EM | Admit: 2021-04-06 | Discharge: 2021-04-07 | Disposition: A | Discharge status: Home / Self Care | Payer: Medicaid Other | Attending: Emergency Medicine | Admitting: Emergency Medicine

## 2021-04-06 ENCOUNTER — Encounter (HOSPITAL_COMMUNITY): Payer: Self-pay

## 2021-04-06 DIAGNOSIS — Z28310 Unvaccinated for covid-19: Secondary | ICD-10-CM | POA: Insufficient documentation

## 2021-04-06 DIAGNOSIS — Z79899 Other long term (current) drug therapy: Secondary | ICD-10-CM | POA: Insufficient documentation

## 2021-04-06 DIAGNOSIS — F17290 Nicotine dependence, other tobacco product, uncomplicated: Secondary | ICD-10-CM | POA: Insufficient documentation

## 2021-04-06 DIAGNOSIS — Z7951 Long term (current) use of inhaled steroids: Secondary | ICD-10-CM | POA: Insufficient documentation

## 2021-04-06 DIAGNOSIS — U071 COVID-19: Secondary | ICD-10-CM

## 2021-04-06 DIAGNOSIS — J45909 Unspecified asthma, uncomplicated: Secondary | ICD-10-CM | POA: Insufficient documentation

## 2021-04-06 DIAGNOSIS — E119 Type 2 diabetes mellitus without complications: Secondary | ICD-10-CM | POA: Insufficient documentation

## 2021-04-06 DIAGNOSIS — Z8616 Personal history of COVID-19: Secondary | ICD-10-CM | POA: Insufficient documentation

## 2021-04-06 DIAGNOSIS — I10 Essential (primary) hypertension: Secondary | ICD-10-CM | POA: Insufficient documentation

## 2021-04-06 MED ORDER — METHYLPREDNISOLONE SODIUM SUCC 125 MG IJ SOLR: 125.0000 mg | Freq: Once | INTRAMUSCULAR | Status: DC | PRN

## 2021-04-06 MED ORDER — SODIUM CHLORIDE 0.9 % IV BOLUS
1000.0000 mL | Freq: Once | INTRAVENOUS | Status: AC
  Administered 2021-04-07: 1000 mL via INTRAVENOUS

## 2021-04-06 MED ORDER — DIPHENHYDRAMINE HCL 50 MG/ML IJ SOLN: 50.0000 mg | Freq: Once | INTRAMUSCULAR | Status: DC | PRN

## 2021-04-06 MED ORDER — BEBTELOVIMAB 175 MG/2 ML IV (EUA)
175.0000 mg | Freq: Once | INTRAMUSCULAR | Status: AC
  Administered 2021-04-07: 175 mg via INTRAVENOUS
  Filled 2021-04-06 (×2): qty 2

## 2021-04-06 MED ORDER — IBUPROFEN 400 MG PO TABS: 600.0000 mg | ORAL_TABLET | Freq: Once | ORAL | Status: DC

## 2021-04-06 MED ORDER — FAMOTIDINE IN NACL 20-0.9 MG/50ML-% IV SOLN
20.0000 mg | Freq: Once | INTRAVENOUS | Status: DC | PRN
  Filled 2021-04-06: qty 50

## 2021-04-06 MED ORDER — ALBUTEROL SULFATE HFA 108 (90 BASE) MCG/ACT IN AERS: 2.0000 | INHALATION_SPRAY | Freq: Once | RESPIRATORY_TRACT | Status: DC | PRN

## 2021-04-06 MED ORDER — SODIUM CHLORIDE 0.9 % IV SOLN: INTRAVENOUS | Status: DC | PRN

## 2021-04-06 MED ORDER — EPINEPHRINE 0.3 MG/0.3ML IJ SOAJ: 0.3000 mg | Freq: Once | INTRAMUSCULAR | Status: DC | PRN

## 2021-04-06 MED ORDER — ACETAMINOPHEN 500 MG PO TABS
1000.0000 mg | ORAL_TABLET | Freq: Once | ORAL | Status: AC
  Administered 2021-04-07: 1000 mg via ORAL
  Filled 2021-04-06: qty 2

## 2021-04-06 NOTE — ED Provider Notes (Signed)
[facility] EMERGENCY DEPARTMENT Provider Note   CSN: [id number] Arrival date & time: 04/06/21  2035     History Chief Complaint  Patient presents with  . Generalized Body Aches  . Fever  . Chills    Lacey Whitaker is a 35 y.o. female with a hx of T2DM, asthma, hypertension, anemia, and prior gastric bypass who presents to the ED with complaints of not feeling well x 3 days with diagnosis of covid 19. Patient states she had an at home rapid covid 19 test that returned positive, she has had fever, chills, generalized body aches, congestion, sore throat, cough productive of phlegm sputum, chest pain with coughing otherwise no pain, dyspnea, and diarrhea. Other than coughing aggravating chest no other alleviating/aggravating factors. Has not been checking her blood sugars at home, some increased urination, no polydipsia. Has not had covid 19 vaccine. Denies syncope, vomiting, abdominal pain, hemoptysis, melena, or hematochezia.   HPI     Past Medical History:  Diagnosis Date  . Asthma   . Cyst, mediastinum    pt. reports having the cyst removed last year and has reoccured  . Diabetes mellitus type II, controlled (HCC) 01/27/2020  . Iron deficiency anemia due to chronic blood loss   . Morbid obesity (HCC)    s/p gastric bypass surgery in 2009  . Pregnancy induced hypertension    2006    Patient Active Problem List   Diagnosis Date Noted  . Physical deconditioning 09/20/2020  . History of COVID-19 09/20/2020  . Pneumonia due to COVID-19 virus 08/16/2020  . Shortness of breath 08/16/2020  . Snoring 08/16/2020  . COVID-19 08/01/2020  . Acute respiratory failure with hypoxia (HCC)   . Diabetes mellitus type II, controlled (HCC) 01/27/2020  . Asthma, chronic   . HTN (hypertension)   . Iron deficiency anemia due to chronic blood loss   . Obesities, morbid (HCC) 08/10/2011    Past Surgical History:  Procedure Laterality Date  . BONE CYST EXCISION  2010  .  CESAREAN SECTION    . CESAREAN SECTION  09/02/2011   Procedure: CESAREAN SECTION;  Surgeon: Purcell Nails, MD;  Location: [facility];  Service: Gynecology;  Laterality: N/A;  . GASTRIC BYPASS  05/2008  . GASTRIC BYPASS  2009  . WISDOM TOOTH EXTRACTION  09/2010     OB History    Gravida  3   Para  3   Term  3   Preterm      AB      Living  3     SAB      IAB      Ectopic      Multiple      Live Births  2           Family History  Problem Relation Age of Onset  . Asthma Mother   . Asthma Brother     Social History   Tobacco Use  . Smoking status: Current Some Day Smoker    Types: Cigars  . Smokeless tobacco: Never Used  . Tobacco comment: Smokes 2 Black & Milds daily  Vaping Use  . Vaping Use: Never used  Substance Use Topics  . Alcohol use: Yes    Comment: occ  . Drug use: No    Home Medications Prior to Admission medications   Medication Sig Start Date End Date Taking? Authorizing Provider  albuterol (PROVENTIL HFA) 108 (90 Base) MCG/ACT inhaler Inhale 2 puffs into the lungs every 6 (  six) hours as needed for up to 8 days for wheezing or shortness of breath. 10/21/20 10/29/20  Ivonne Andrew, NP  budesonide-formoterol (SYMBICORT) 80-4.5 MCG/ACT inhaler INHALE 2 PUFFS INTO THE LUNGS 2 (TWO) TIMES DAILY. 09/20/20 09/20/21  Ivonne Andrew, NP  ibuprofen (ADVIL) 600 MG tablet Take 1 tablet (600 mg total) by mouth every 6 (six) hours as needed. 03/17/21   Merrilee Jansky, MD  levonorgestrel (MIRENA) 20 MCG/24HR IUD 1 each by Intrauterine route once.    [provider]  lisinopril-hydrochlorothiazide (ZESTORETIC) 20-12.5 MG tablet Take 2 tablets by mouth daily. 07/12/18 08/01/20  Sudie Grumbling, NP  montelukast (SINGULAIR) 10 MG tablet TAKE 1 TABLET (10 MG TOTAL) BY MOUTH AT BEDTIME. 09/20/20 09/20/21  Ivonne Andrew, NP  omeprazole (PRILOSEC) 20 MG capsule Take 1 capsule (20 mg total) by mouth daily. 07/24/20   Georgetta Haber, NP  tiZANidine  (ZANAFLEX) 2 MG tablet Take 2 mg by mouth every 6 (six) hours as needed for muscle spasms.    [provider]  Vitamin D, Ergocalciferol, (DRISDOL) 1.25 MG (50000 UT) CAPS capsule Take 50,000 Units by mouth every Monday.     [provider]    Allergies    Patient has no known allergies.  Review of Systems   Review of Systems  Constitutional: Positive for chills, fatigue and fever.  HENT: Positive for congestion and sore throat.   Respiratory: Positive for cough and shortness of breath.   Cardiovascular: Positive for chest pain (with coughing, otherwise none).  Gastrointestinal: Positive for diarrhea and nausea. Negative for abdominal pain, anal bleeding, blood in stool, constipation and vomiting.  Genitourinary: Positive for frequency.  Musculoskeletal: Positive for myalgias.  Neurological: Negative for syncope.  All other systems reviewed and are negative.   Physical Exam Updated Vital Signs BP (!) 163/74 (BP Location: Right Arm)   Pulse (!) 106   Temp (!) 100.5 F (38.1 C) (Oral)   Resp 20   Ht 5\' 7"  (1.702 m)   Wt (!) 171.9 kg   SpO2 96%   BMI 59.36 kg/m   Physical Exam Vitals and nursing note reviewed.  Constitutional:      General: She is not in acute distress.    Appearance: She is well-developed.  HENT:     Head: Normocephalic and atraumatic.     Ears:     Comments: No mastoid erythema/swelling/tenderness.     Nose:     Right Sinus: No maxillary sinus tenderness or frontal sinus tenderness.     Left Sinus: No maxillary sinus tenderness or frontal sinus tenderness.     Mouth/Throat:     Mouth: Mucous membranes are dry.     Pharynx: Uvula midline. No oropharyngeal exudate or posterior oropharyngeal erythema.     Comments: Posterior oropharynx is symmetric appearing. Patient tolerating own secretions without difficulty. No trismus. No drooling. No hot potato voice. No swelling beneath the tongue, submandibular compartment is soft.  Eyes:      General:        Right eye: No discharge.        Left eye: No discharge.     Conjunctiva/sclera: Conjunctivae normal.     Pupils: Pupils are equal, round, and reactive to light.  Cardiovascular:     Rate and Rhythm: Normal rate and regular rhythm.     Heart sounds: No murmur heard.   Pulmonary:     Effort: Pulmonary effort is normal. No respiratory distress.     Breath  sounds: Normal breath sounds. No wheezing, rhonchi or rales.  Chest:     Chest wall: Tenderness (anterior chest wall- reproduces patient's pain. ) present.  Abdominal:     General: There is no distension.     Palpations: Abdomen is soft.     Tenderness: There is no abdominal tenderness. There is no guarding or rebound.  Musculoskeletal:     Cervical back: Normal range of motion and neck supple. No edema or rigidity.  Lymphadenopathy:     Cervical: No cervical adenopathy.  Skin:    General: Skin is warm and dry.     Findings: No rash.  Neurological:     Mental Status: She is alert.  Psychiatric:        Behavior: Behavior normal.     ED Results / Procedures / Treatments   Labs (all labs ordered are listed, but only abnormal results are displayed) Labs Reviewed  CBC WITH DIFFERENTIAL/PLATELET - Abnormal; Notable for the following components:      Result Value   Hemoglobin 11.5 (*)    RDW 15.7 (*)    All other components within normal limits  BASIC METABOLIC PANEL - Abnormal; Notable for the following components:   Glucose, Bld 102 (*)    BUN 5 (*)    Calcium 8.1 (*)    All other components within normal limits  URINALYSIS, ROUTINE W REFLEX MICROSCOPIC  POC URINE PREG, ED  I-STAT BETA HCG BLOOD, ED ([facility], [facility], [facility] ONLY)    EKG EKG Interpretation  Date/Time:  Thursday Apr 07 2021 00:11:51 EDT Ventricular Rate:  95 PR Interval:  159 QRS Duration: 83 QT Interval:  326 QTC Calculation: 410 R Axis:   87 Text Interpretation: Sinus rhythm Normal ECG Confirmed by Gilda Crease [phone]) on 04/07/2021  12:43:06 AM   Radiology DG Chest Portable 1 View  Result Date: 04/06/2021 CLINICAL DATA:  Cough, shortness of breath, fevers, chills and body aches with diarrhea, COVID-19 positive on at home testing EXAM: PORTABLE CHEST 1 VIEW COMPARISON:  10/24/2020 FINDINGS: Hazy opacities in the lung bases may be related to body habitus, low volumes and atelectasis or early developing airspace opacity the setting of COVID-19 positivity. No pneumothorax or visible effusion. Cardiomediastinal contours are unremarkable. No acute osseous or soft tissue abnormality. IMPRESSION: Hazy basilar opacities, possibly is aided to body habitus, atelectasis or early airspace disease in the setting of COVID 19. Electronically Signed   By: Kreg Shropshire M.D.   On: 04/06/2021 22:34    Procedures Procedures   Medications Ordered in ED Medications  ibuprofen (ADVIL) tablet 600 mg (has no administration in time range)    ED Course  I have reviewed the triage vital signs and the nursing notes.  Pertinent labs & imaging results that were available during my care of the patient were reviewed by me and considered in my medical decision making (see chart for details).  Lacey Whitaker was evaluated in Emergency Department on 04/07/2021 for the symptoms described in the history of present illness. He/she was evaluated in the context of the global COVID-19 pandemic, which necessitated consideration that the patient might be at risk for infection with the SARS-CoV-2 virus that causes COVID-19. Institutional protocols and algorithms that pertain to the evaluation of patients at risk for COVID-19 are in a state of rapid change based on information released by regulatory bodies including the CDC and federal and state organizations. These policies and algorithms were followed during the patient's care in the ED.  MDM Rules/Calculators/A&P                          Patient presents to the ED with complaints of not feeling well with  positive covid 19 test at home. Patient nontoxic, vitals w/ fever and likely resultant tachycardia on arrival- resolved on exam. Mild dry MM. Chest pain reproducible with chest wall palpation.    Additional history obtained:  Additional history obtained from chart review & nursing note review.   EKG: NSR, no STEMI.  Lab Tests:  I Ordered, reviewed, and interpreted labs, which included:  Preg test: Negative CBC: mild anemia, similar to prior.  BMP: no significant electrolyte derangement, mild hypocalcemia UA; No UTI  Imaging Studies ordered:  CXR ordered in triage, I independently reviewed, formal radiology impression shows: Hazy basilar opacities, possibly is aided to body habitus, atelectasis or early airspace disease in the setting of COVID 19  ED Course:  Suspect patient is symptomatic from COVID 19.  No meningismus.  Abdomen nontender w/o peritoneal signs.  Reassuring labs & EKG.  Lungs clear on exam, ambulatory SpO2 > 95% on RA. Patient does not appear to be in respiratory distress, does not appear to require admission for respiratory support at this time. Discussed with ED pharmacist regarding tx options- will proceed with MAB.   Tolerated MAB well, received IV fluids in the ED, tolerating PO, and is ambulatory without respiratory distress or hypoxia. Will discharge home with continued supportive care. I discussed results, treatment plan, need for follow-up, and return precautions with the patient. Provided opportunity for questions, patient confirmed understanding and is in agreement with plan.   Blood pressure 119/65, pulse 77, temperature (!) 100.5 F (38.1 C), temperature source Oral, resp. rate 15, height 5\' 7"  (1.702 m), weight (!) 171.9 kg, SpO2 97 %.  Portions of this note were generated with Scientist, clinical (histocompatibility and immunogenetics)Dragon dictation software. Dictation errors may occur despite best attempts at proofreading.  Final Clinical Impression(s) / ED Diagnoses Final diagnoses:  COVID-19    Rx / DC  Orders ED Discharge Orders    None       Cherly Andersonetrucelli, Allayah Raineri R, PA-C 04/07/21 0431    Gilda CreasePollina, Christopher J, MD 04/07/21 [phone]

## 2021-04-06 NOTE — ED Provider Notes (Signed)
Emergency Medicine Provider Triage Evaluation Note  Lacey Whitaker , a 35 y.o. female  was evaluated in triage.  Pt complains of + COVID test at home, rapid test. Fevers, HA yesterday. Now with Diarrhea x 3, abdominal pain, N. No vomiting.  Not vaccinated for COVID, dayquil at home. Concern for severity of myalgias and CP today.   Review of Systems  Positive: Cough, fevers, chills, D, HA, myalgias Negative: V, syncope, loss of taste of smell  Physical Exam  BP (!) 163/74 (BP Location: Right Arm)   Pulse (!) 106   Temp (!) 100.5 F (38.1 C) (Oral)   Resp 20   Ht 5\' 7"  (1.702 m)   Wt (!) 171.9 kg   SpO2 96%   BMI 59.36 kg/m  Gen:   Awake, no distress   Resp:  Normal effort  MSK:   Moves extremities without difficulty  Other:  Congestion, fever  Medical Decision Making  Medically screening exam initiated at 9:29 PM.  Appropriate orders placed.  Glennie Bose was informed that the remainder of the evaluation will be completed by another provider, this initial triage assessment does not replace that evaluation, and the importance of remaining in the ED until their evaluation is complete.  This chart was dictated using voice recognition software, Dragon. Despite the best efforts of this provider to proofread and correct errors, errors may still occur which can change documentation meaning.    Brayton El 04/06/21 2131    [id number], MD 04/06/21 2157

## 2021-04-06 NOTE — ED Notes (Signed)
Walking oximetry of 97%

## 2021-04-06 NOTE — ED Triage Notes (Signed)
Fever, chills, generalized body aches, diarrhea, cough today. Tested positive for at home covid test.

## 2021-04-07 LAB — CBC WITH DIFFERENTIAL/PLATELET
Abs Immature Granulocytes: 0.03 10*3/uL (ref 0.00–0.07)
Basophils Absolute: 0 10*3/uL (ref 0.0–0.1)
Basophils Relative: 0 %
Eosinophils Absolute: 0.1 10*3/uL (ref 0.0–0.5)
Eosinophils Relative: 2 %
HCT: 36 % (ref 36.0–46.0)
Hemoglobin: 11.5 g/dL — ABNORMAL LOW (ref 12.0–15.0)
Immature Granulocytes: 1 %
Lymphocytes Relative: 14 %
Lymphs Abs: 0.8 10*3/uL (ref 0.7–4.0)
MCH: 26.8 pg (ref 26.0–34.0)
MCHC: 31.9 g/dL (ref 30.0–36.0)
MCV: 83.9 fL (ref 80.0–100.0)
Monocytes Absolute: 0.9 10*3/uL (ref 0.1–1.0)
Monocytes Relative: 16 %
Neutro Abs: 3.7 10*3/uL (ref 1.7–7.7)
Neutrophils Relative %: 67 %
Platelets: 256 10*3/uL (ref 150–400)
RBC: 4.29 MIL/uL (ref 3.87–5.11)
RDW: 15.7 % — ABNORMAL HIGH (ref 11.5–15.5)
WBC: 5.6 10*3/uL (ref 4.0–10.5)
nRBC: 0 % (ref 0.0–0.2)

## 2021-04-07 LAB — BASIC METABOLIC PANEL
Anion gap: 8 (ref 5–15)
BUN: 5 mg/dL — ABNORMAL LOW (ref 6–20)
CO2: 25 mmol/L (ref 22–32)
Calcium: 8.1 mg/dL — ABNORMAL LOW (ref 8.9–10.3)
Chloride: 102 mmol/L (ref 98–111)
Creatinine, Ser: 0.89 mg/dL (ref 0.44–1.00)
GFR, Estimated: >60 mL/min (ref >60)
Glucose, Bld: 102 mg/dL — ABNORMAL HIGH (ref 70–99)
Potassium: 3.7 mmol/L (ref 3.5–5.1)
Sodium: 135 mmol/L (ref 135–145)

## 2021-04-07 LAB — URINALYSIS, ROUTINE W REFLEX MICROSCOPIC
Bacteria, UA: NONE SEEN
Bilirubin Urine: NEGATIVE
Glucose, UA: NEGATIVE mg/dL
Hgb urine dipstick: NEGATIVE
Ketones, ur: NEGATIVE mg/dL
Leukocytes,Ua: NEGATIVE
Nitrite: NEGATIVE
Protein, ur: NEGATIVE mg/dL
Specific Gravity, Urine: 1.017 (ref 1.005–1.030)
pH: 7 (ref 5.0–8.0)

## 2021-04-07 LAB — I-STAT BETA HCG BLOOD, ED (MC, WL, AP ONLY): I-stat hCG, quantitative: <5 m[IU]/mL (ref <5)

## 2021-04-07 NOTE — Discharge Instructions (Addendum)
You were seen in the Er today and received a monoclonal antibody infusion for covid 19.  Please follow current CDC guidelines for isolation.  Take tylenol per over the counter dosing for pain/fever. Be sure to drink plenty of fluids.   Please follow up with primary care within 3-5 days for re-evaluation- call prior to going to the office to make them aware of your symptoms as some offices are altering their method of seeing patients with COVID 19 symptoms, we have also provided our [place] covid clinic for follow up as well.  Return to the ER for new or worsening symptoms including but not limited to increased work of breathing, chest pain, new or worsening pain, coughing up blood, inability to keep fluids down, passing out, or any other concerns.       Person Under Monitoring Name: Lacey Whitaker  Location: [address] [place] [address]   Infection Prevention Recommendations for Individuals Confirmed to have, or Being Evaluated for, 2019 Novel Coronavirus (COVID-19) Infection Who Receive Care at Home  Individuals who are confirmed to have, or are being evaluated for, COVID-19 should follow the prevention steps below until a healthcare provider or local or state health department says they can return to normal activities.  Stay home except to get medical care You should restrict activities outside your home, except for getting medical care. Do not go to work, school, or public areas, and do not use public transportation or taxis.  Call ahead before visiting your doctor Before your medical appointment, call the healthcare provider and tell them that you have, or are being evaluated for, COVID-19 infection. This will help the healthcare provider's office take steps to keep other people from getting infected. Ask your healthcare provider to call the local or state health department.  Monitor your symptoms Seek prompt medical attention if your illness is worsening (e.g.,  difficulty breathing). Before going to your medical appointment, call the healthcare provider and tell them that you have, or are being evaluated for, COVID-19 infection. Ask your healthcare provider to call the local or state health department.  Wear a facemask You should wear a facemask that covers your nose and mouth when you are in the same room with other people and when you visit a healthcare provider. People who live with or visit you should also wear a facemask while they are in the same room with you.  Separate yourself from other people in your home As much as possible, you should stay in a different room from other people in your home. Also, you should use a separate bathroom, if available.  Avoid sharing household items You should not share dishes, drinking glasses, cups, eating utensils, towels, bedding, or other items with other people in your home. After using these items, you should wash them thoroughly with soap and water.  Cover your coughs and sneezes Cover your mouth and nose with a tissue when you cough or sneeze, or you can cough or sneeze into your sleeve. Throw used tissues in a lined trash can, and immediately wash your hands with soap and water for at least 20 seconds or use an alcohol-based hand rub.  Wash your [employer] your hands often and thoroughly with soap and water for at least 20 seconds. You can use an alcohol-based hand sanitizer if soap and water are not available and if your hands are not visibly dirty. Avoid touching your eyes, nose, and mouth with unwashed hands.   Prevention Steps for Caregivers and  Household Members of Individuals Confirmed to have, or Being Evaluated for, COVID-19 Infection Being Cared for in the Home  If you live with, or provide care at home for, a person confirmed to have, or being evaluated for, COVID-19 infection please follow these guidelines to prevent infection:  Follow healthcare provider's instructions Make  sure that you understand and can help the patient follow any healthcare provider instructions for all care.  Provide for the patient's basic needs You should help the patient with basic needs in the home and provide support for getting groceries, prescriptions, and other personal needs.  Monitor the patient's symptoms If they are getting sicker, call his or her medical provider and tell them that the patient has, or is being evaluated for, COVID-19 infection. This will help the healthcare provider's office take steps to keep other people from getting infected. Ask the healthcare provider to call the local or state health department.  Limit the number of people who have contact with the patient If possible, have only one caregiver for the patient. Other household members should stay in another home or place of residence. If this is not possible, they should stay in another room, or be separated from the patient as much as possible. Use a separate bathroom, if available. Restrict visitors who do not have an essential need to be in the home.  Keep older adults, very young children, and other sick people away from the patient Keep older adults, very young children, and those who have compromised immune systems or chronic health conditions away from the patient. This includes people with chronic heart, lung, or kidney conditions, diabetes, and cancer.  Ensure good ventilation Make sure that shared spaces in the home have good air flow, such as from an air conditioner or an opened window, weather permitting.  Wash your hands often Wash your hands often and thoroughly with soap and water for at least 20 seconds. You can use an alcohol based hand sanitizer if soap and water are not available and if your hands are not visibly dirty. Avoid touching your eyes, nose, and mouth with unwashed hands. Use disposable paper towels to dry your hands. If not available, use dedicated cloth towels and replace  them when they become wet.  Wear a facemask and gloves Wear a disposable facemask at all times in the room and gloves when you touch or have contact with the patient's blood, body fluids, and/or secretions or excretions, such as sweat, saliva, sputum, nasal mucus, vomit, urine, or feces.  Ensure the mask fits over your nose and mouth tightly, and do not touch it during use. Throw out disposable facemasks and gloves after using them. Do not reuse. Wash your hands immediately after removing your facemask and gloves. If your personal clothing becomes contaminated, carefully remove clothing and launder. Wash your hands after handling contaminated clothing. Place all used disposable facemasks, gloves, and other waste in a lined container before disposing them with other household waste. Remove gloves and wash your hands immediately after handling these items.  Do not share dishes, glasses, or other household items with the patient Avoid sharing household items. You should not share dishes, drinking glasses, cups, eating utensils, towels, bedding, or other items with a patient who is confirmed to have, or being evaluated for, COVID-19 infection. After the person uses these items, you should wash them thoroughly with soap and water.  Wash laundry thoroughly Immediately remove and wash clothes or bedding that have blood, body fluids, and/or secretions or  excretions, such as sweat, saliva, sputum, nasal mucus, vomit, urine, or feces, on them. Wear gloves when handling laundry from the patient. Read and follow directions on labels of laundry or clothing items and detergent. In general, wash and dry with the warmest temperatures recommended on the label.  Clean all areas the individual has used often Clean all touchable surfaces, such as counters, tabletops, doorknobs, bathroom fixtures, toilets, phones, keyboards, tablets, and bedside tables, every day. Also, clean any surfaces that may have blood, body  fluids, and/or secretions or excretions on them. Wear gloves when cleaning surfaces the patient has come in contact with. Use a diluted bleach solution (e.g., dilute bleach with 1 part bleach and 10 parts water) or a household disinfectant with a label that says EPA-registered for coronaviruses. To make a bleach solution at home, add 1 tablespoon of bleach to 1 quart (4 cups) of water. For a larger supply, add  cup of bleach to 1 gallon (16 cups) of water. Read labels of cleaning products and follow recommendations provided on product labels. Labels contain instructions for safe and effective use of the cleaning product including precautions you should take when applying the product, such as wearing gloves or eye protection and making sure you have good ventilation during use of the product. Remove gloves and wash hands immediately after cleaning.  Monitor yourself for signs and symptoms of illness Caregivers and household members are considered close contacts, should monitor their health, and will be asked to limit movement outside of the home to the extent possible. Follow the monitoring steps for close contacts listed on the symptom monitoring form.   ? If you have additional questions, contact your local health department or call the epidemiologist on call at [phone] (available 24/7). ? This guidance is subject to change. For the most up-to-date guidance from [facility], please refer to their website: [email or web address]

## 2021-04-07 NOTE — ED Provider Notes (Incomplete)
[facility] EMERGENCY DEPARTMENT Provider Note   CSN: [id number] Arrival date & time: 04/06/21  2035     History Chief Complaint  Patient presents with  . Generalized Body Aches  . Fever  . Chills    Lacey Whitaker is a 35 y.o. female with a hx of T2DM, asthma, hypertension, anemia, and prior gastric bypass who presents to the ED with complaints of not feeling well x 3 days with diagnosis of covid 19. Patient states she had an at home rapid covid 19 test that returned positive, she has had fever, chills, generalized body aches, congestion, sore throat, cough productive of phlegm sputum, chest pain with coughing otherwise no pain, dyspnea, and diarrhea. Other than coughing aggravating chest no other alleviating/aggravating factors. Has not had covid 19 vaccine. Denies syncope, vomiting, abdominal pain, hemoptysis, melena, or hematochezia.   HPI     Past Medical History:  Diagnosis Date  . Asthma   . Cyst, mediastinum    pt. reports having the cyst removed last year and has reoccured  . Diabetes mellitus type II, controlled (HCC) 01/27/2020  . Iron deficiency anemia due to chronic blood loss   . Morbid obesity (HCC)    s/p gastric bypass surgery in 2009  . Pregnancy induced hypertension    2006    Patient Active Problem List   Diagnosis Date Noted  . Physical deconditioning 09/20/2020  . History of COVID-19 09/20/2020  . Pneumonia due to COVID-19 virus 08/16/2020  . Shortness of breath 08/16/2020  . Snoring 08/16/2020  . COVID-19 08/01/2020  . Acute respiratory failure with hypoxia (HCC)   . Diabetes mellitus type II, controlled (HCC) 01/27/2020  . Asthma, chronic   . HTN (hypertension)   . Iron deficiency anemia due to chronic blood loss   . Obesities, morbid (HCC) 08/10/2011    Past Surgical History:  Procedure Laterality Date  . BONE CYST EXCISION  2010  . CESAREAN SECTION    . CESAREAN SECTION  09/02/2011   Procedure: CESAREAN SECTION;   Surgeon: Purcell Nails, MD;  Location: [facility];  Service: Gynecology;  Laterality: N/A;  . GASTRIC BYPASS  05/2008  . GASTRIC BYPASS  2009  . WISDOM TOOTH EXTRACTION  09/2010     OB History    Gravida  3   Para  3   Term  3   Preterm      AB      Living  3     SAB      IAB      Ectopic      Multiple      Live Births  2           Family History  Problem Relation Age of Onset  . Asthma Mother   . Asthma Brother     Social History   Tobacco Use  . Smoking status: Current Some Day Smoker    Types: Cigars  . Smokeless tobacco: Never Used  . Tobacco comment: Smokes 2 Black & Milds daily  Vaping Use  . Vaping Use: Never used  Substance Use Topics  . Alcohol use: Yes    Comment: occ  . Drug use: No    Home Medications Prior to Admission medications   Medication Sig Start Date End Date Taking? Authorizing Provider  albuterol (PROVENTIL HFA) 108 (90 Base) MCG/ACT inhaler Inhale 2 puffs into the lungs every 6 (six) hours as needed for up to 8 days for wheezing or shortness of  breath. 10/21/20 10/29/20  Ivonne Andrew, NP  budesonide-formoterol (SYMBICORT) 80-4.5 MCG/ACT inhaler INHALE 2 PUFFS INTO THE LUNGS 2 (TWO) TIMES DAILY. 09/20/20 09/20/21  Ivonne Andrew, NP  ibuprofen (ADVIL) 600 MG tablet Take 1 tablet (600 mg total) by mouth every 6 (six) hours as needed. 03/17/21   Merrilee Jansky, MD  levonorgestrel (MIRENA) 20 MCG/24HR IUD 1 each by Intrauterine route once.    [provider]  lisinopril-hydrochlorothiazide (ZESTORETIC) 20-12.5 MG tablet Take 2 tablets by mouth daily. 07/12/18 08/01/20  Sudie Grumbling, NP  montelukast (SINGULAIR) 10 MG tablet TAKE 1 TABLET (10 MG TOTAL) BY MOUTH AT BEDTIME. 09/20/20 09/20/21  Ivonne Andrew, NP  omeprazole (PRILOSEC) 20 MG capsule Take 1 capsule (20 mg total) by mouth daily. 07/24/20   Georgetta Haber, NP  tiZANidine (ZANAFLEX) 2 MG tablet Take 2 mg by mouth every 6 (six) hours as needed for muscle  spasms.    [provider]  Vitamin D, Ergocalciferol, (DRISDOL) 1.25 MG (50000 UT) CAPS capsule Take 50,000 Units by mouth every Monday.     [provider]    Allergies    Patient has no known allergies.  Review of Systems   Review of Systems  Constitutional: Positive for chills, fatigue and fever.  Musculoskeletal: Positive for myalgias.  Neurological: Negative for syncope.  All other systems reviewed and are negative.   Physical Exam Updated Vital Signs BP (!) 163/74 (BP Location: Right Arm)   Pulse (!) 106   Temp (!) 100.5 F (38.1 C) (Oral)   Resp 20   Ht 5\' 7"  (1.702 m)   Wt (!) 171.9 kg   SpO2 96%   BMI 59.36 kg/m   Physical Exam  ED Results / Procedures / Treatments   Labs (all labs ordered are listed, but only abnormal results are displayed) Labs Reviewed  POC URINE PREG, ED    EKG None  Radiology DG Chest Portable 1 View  Result Date: 04/06/2021 CLINICAL DATA:  Cough, shortness of breath, fevers, chills and body aches with diarrhea, COVID-19 positive on at home testing EXAM: PORTABLE CHEST 1 VIEW COMPARISON:  10/24/2020 FINDINGS: Hazy opacities in the lung bases may be related to body habitus, low volumes and atelectasis or early developing airspace opacity the setting of COVID-19 positivity. No pneumothorax or visible effusion. Cardiomediastinal contours are unremarkable. No acute osseous or soft tissue abnormality. IMPRESSION: Hazy basilar opacities, possibly is aided to body habitus, atelectasis or early airspace disease in the setting of COVID 19. Electronically Signed   By: 14/03/2020 M.D.   On: 04/06/2021 22:34    Procedures Procedures {Remember to document critical care time when appropriate:1}  Medications Ordered in ED Medications  ibuprofen (ADVIL) tablet 600 mg (has no administration in time range)    ED Course  I have reviewed the triage vital signs and the nursing notes.  Pertinent labs & imaging results that were  available during my care of the patient were reviewed by me and considered in my medical decision making (see chart for details).    MDM Rules/Calculators/A&P                          *** Final Clinical Impression(s) / ED Diagnoses Final diagnoses:  None    Rx / DC Orders ED Discharge Orders    None

## 2021-04-19 ENCOUNTER — Encounter (INDEPENDENT_AMBULATORY_CARE_PROVIDER_SITE_OTHER): Payer: Self-pay | Admitting: Nurse Practitioner

## 2021-04-19 NOTE — Progress Notes (Signed)
Erroneous

## 2021-04-22 ENCOUNTER — Telehealth (INDEPENDENT_AMBULATORY_CARE_PROVIDER_SITE_OTHER): Payer: Self-pay | Admitting: Nurse Practitioner

## 2021-04-22 ENCOUNTER — Other Ambulatory Visit: Payer: Self-pay

## 2021-04-22 DIAGNOSIS — U071 COVID-19: Secondary | ICD-10-CM

## 2021-04-22 MED ORDER — BENZONATATE 100 MG PO CAPS: 100.0000 mg | ORAL_CAPSULE | Freq: Two times a day (BID) | ORAL | 0 refills | Status: DC | PRN

## 2021-04-22 MED ORDER — BENZONATATE 100 MG PO CAPS
200.0000 mg | ORAL_CAPSULE | Freq: Two times a day (BID) | ORAL | 0 refills | Status: AC | PRN
  Filled 2021-04-22 – 2021-05-03 (×2): qty 40, 10d supply, fill #0

## 2021-04-22 MED ORDER — PREDNISONE 10 MG PO TABS: ORAL_TABLET | ORAL | 0 refills | Status: DC

## 2021-04-22 MED ORDER — PREDNISONE 10 MG PO TABS
ORAL_TABLET | ORAL | 0 refills | Status: DC
  Filled 2021-04-22 – 2021-05-03 (×2): qty 20, 8d supply, fill #0

## 2021-04-22 NOTE — Patient Instructions (Signed)
Covid 19 Cough:   Stay well hydrated  Stay active  Deep breathing exercises  May take tylenol for fever or pain  Will order prednisone  Will order tessalon perles    Follow up:  Follow up in 2 weeks or sooner if needed - will need follow up imaging

## 2021-04-22 NOTE — Progress Notes (Signed)
Virtual Visit via Telephone Note  I connected with Lacey Whitaker on 04/25/21 at  8:30 AM EDT by telephone and verified that I am speaking with the correct person using two identifiers.  Location: Patient: home Provider: office   I discussed the limitations, risks, security and privacy concerns of performing an evaluation and management service by telephone and the availability of in person appointments. I also discussed with the patient that there may be a patient responsible charge related to this service. The patient expressed understanding and agreed to proceed.   History of Present Illness:  Patient presents today for post-COVID care clinic visit through televisit.  Patient was diagnosed with COVID on 04/06/2021.  She did receive monoclonal antibody infusion in the emergency room.  She does still complain of ongoing cough and congestion.  She states that she has improved.  Her chest x-ray in the ED was concerning for developing COVID-pneumonia.  We discussed that she will need to follow-up visit in 2 weeks and possible repeat chest x-ray. Denies f/c/s, n/v/d, hemoptysis, PND, chest pain or edema.      Observations/Objective:  Vitals with BMI 04/07/2021 04/07/2021 04/07/2021  Height - - -  Weight - - -  BMI - - -  Systolic 119 142 341  Diastolic 65 85 73  Pulse 77 80 78      Assessment and Plan:  Covid 19 Cough:   Stay well hydrated  Stay active  Deep breathing exercises  May take tylenol for fever or pain  Will order prednisone  Will order tessalon perles    Follow up:  Follow up in 2 weeks or sooner if needed - will need follow up imaging       I discussed the assessment and treatment plan with the patient. The patient was provided an opportunity to ask questions and all were answered. The patient agreed with the plan and demonstrated an understanding of the instructions.   The patient was advised to call back or seek an in-person evaluation if the  symptoms worsen or if the condition fails to improve as anticipated.  I provided 23 minutes of non-face-to-face time during this encounter.   Ivonne Andrew, NP

## 2021-04-29 ENCOUNTER — Other Ambulatory Visit: Payer: Self-pay

## 2021-05-03 ENCOUNTER — Other Ambulatory Visit: Payer: Self-pay

## 2021-05-09 ENCOUNTER — Other Ambulatory Visit: Payer: Self-pay

## 2021-05-09 ENCOUNTER — Ambulatory Visit (INDEPENDENT_AMBULATORY_CARE_PROVIDER_SITE_OTHER): Payer: Medicaid Other | Admitting: Nurse Practitioner

## 2021-05-09 VITALS — BP 155/100 | HR 67 | Temp 97.9°F | Resp 18

## 2021-05-09 DIAGNOSIS — Z8616 Personal history of COVID-19: Secondary | ICD-10-CM

## 2021-05-09 DIAGNOSIS — U071 COVID-19: Secondary | ICD-10-CM

## 2021-05-09 DIAGNOSIS — I10 Essential (primary) hypertension: Secondary | ICD-10-CM

## 2021-05-09 MED ORDER — LISINOPRIL-HYDROCHLOROTHIAZIDE 20-12.5 MG PO TABS
2.0000 | ORAL_TABLET | Freq: Every day | ORAL | 0 refills | Status: DC
  Filled 2021-05-09: qty 60, 30d supply, fill #0

## 2021-05-09 NOTE — Patient Instructions (Signed)
Covid 19 Cough:   Stay well hydrated  Stay active  Deep breathing exercises   May take tylenol or fever or pain  Will order chest x ray:  [facility] [address] [place], [place] [address] [phone] MON - FRI 8:00 AM - 4:00 PM - WALK IN   Follow up:  Follow up if needed

## 2021-05-09 NOTE — Progress Notes (Signed)
@Patient  ID: , female    DOB: 10-30-1986, 35 y.o.   MRN: [id number]  Chief Complaint  Patient presents with   Follow-up    Referring provider: No ref. provider found  HPI  Patient presents today for a follow-up visit.  Patient was last seen in our office on 04/22/2021 for ED follow-up.  Patient has tested positive for COVID a second time in May 2022.  Overall she is much improved.  She was unable to get her medications at her last visit due to cost concerns.  She states that overall she has improved and her cough is significantly better.  We will order a repeat chest x-ray today.  Patient's blood pressure was noted to be elevated in office today.  She states that she has been out of her blood pressure medicine because she has not had a PCP.  We will refill her blood pressure medicine and get her set up for a follow-up appointment to establish care with a PCP.  Denies f/c/s, n/v/d, hemoptysis, PND, chest pain or edema.        No Known Allergies  Immunization History  Administered Date(s) Administered   Influenza,inj,Quad PF,6+ Mos 01/19/2020   Tdap 09/04/2011, 02/06/2013    Past Medical History:  Diagnosis Date   Asthma    Cyst, mediastinum    pt. reports having the cyst removed last year and has reoccured   Diabetes mellitus type II, controlled (HCC) 01/27/2020   Iron deficiency anemia due to chronic blood loss    Morbid obesity (HCC)    s/p gastric bypass surgery in 2009   Pregnancy induced hypertension    2006    Tobacco History: Social History   Tobacco Use  Smoking Status Some Days   Pack years: 0.00   Types: Cigars  Smokeless Tobacco Never  Tobacco Comments   Smokes 2 Black & Milds daily   Ready to quit: Not Answered Counseling given: Yes Tobacco comments: Smokes 2 Black & Milds daily   Outpatient Encounter Medications as of 05/09/2021  Medication Sig   albuterol (PROVENTIL HFA) 108 (90 Base) MCG/ACT inhaler Inhale 2 puffs into the lungs  every 6 (six) hours as needed for up to 8 days for wheezing or shortness of breath.   [EXPIRED] benzonatate (TESSALON) 100 MG capsule Take 2 capsules (200 mg total) by mouth 2 (two) times daily as needed for up to 10 days for cough.   budesonide-formoterol (SYMBICORT) 80-4.5 MCG/ACT inhaler INHALE 2 PUFFS INTO THE LUNGS 2 (TWO) TIMES DAILY.   ibuprofen (ADVIL) 600 MG tablet Take 1 tablet (600 mg total) by mouth every 6 (six) hours as needed.   levonorgestrel (MIRENA) 20 MCG/24HR IUD 1 each by Intrauterine route once.   lisinopril-hydrochlorothiazide (ZESTORETIC) 20-12.5 MG tablet Take 2 tablets by mouth daily. need to see PCP for additional refills   montelukast (SINGULAIR) 10 MG tablet TAKE 1 TABLET (10 MG TOTAL) BY MOUTH AT BEDTIME.   omeprazole (PRILOSEC) 20 MG capsule Take 1 capsule (20 mg total) by mouth daily.   predniSONE (DELTASONE) 10 MG tablet Take 4 tabs for 2 days, then 3 tabs for 2 days, then 2 tabs for 2 days, then 1 tab for 2 days, then stop   tiZANidine (ZANAFLEX) 2 MG tablet Take 2 mg by mouth every 6 (six) hours as needed for muscle spasms.   Vitamin D, Ergocalciferol, (DRISDOL) 1.25 MG (50000 UT) CAPS capsule Take 50,000 Units by mouth every Monday.    [DISCONTINUED] lisinopril-hydrochlorothiazide (ZESTORETIC) 20-12.5  MG tablet Take 2 tablets by mouth daily.   No facility-administered encounter medications on file as of 05/09/2021.     Review of Systems  Review of Systems  Constitutional: Negative.  Negative for fatigue and fever.  HENT: Negative.    Respiratory:  Positive for cough. Negative for shortness of breath.   Cardiovascular: Negative.   Gastrointestinal: Negative.   Allergic/Immunologic: Negative.   Neurological: Negative.   Psychiatric/Behavioral: Negative.        Physical Exam  BP (!) 155/100   Pulse 67   Temp 97.9 F (36.6 C)   Resp 18   SpO2 100%   Wt Readings from Last 5 Encounters:  04/06/21 (!) 379 lb (171.9 kg)  10/21/20 (!) 378 lb (171.5  kg)  09/20/20 (!) 380 lb 0.1 oz (172.4 kg)  09/07/20 (!) 380 lb 3.2 oz (172.5 kg)  08/16/20 (!) 381 lb 0.1 oz (172.8 kg)     Physical Exam Vitals and nursing note reviewed.  Constitutional:      General: She is not in acute distress.    Appearance: She is well-developed.  Cardiovascular:     Rate and Rhythm: Normal rate and regular rhythm.  Pulmonary:     Effort: Pulmonary effort is normal.     Breath sounds: Normal breath sounds.  Neurological:     Mental Status: She is alert and oriented to person, place, and time.      Assessment & Plan:   COVID-19 Cough:   Stay well hydrated  Stay active  Deep breathing exercises   May take tylenol or fever or pain  Will order chest x ray:  [facility] [address] [place], [place] [address] [phone] MON - FRI 8:00 AM - 4:00 PM - WALK IN   Follow up:  Follow up if needed     Ivonne Andrew, NP 05/10/2021

## 2021-05-10 ENCOUNTER — Other Ambulatory Visit: Payer: Self-pay

## 2021-05-10 NOTE — Assessment & Plan Note (Signed)
Cough:   Stay well hydrated  Stay active  Deep breathing exercises   May take tylenol or fever or pain  Will order chest x ray:  [facility] [address] [place] [address], [place] [address] [phone] MON - FRI 8:00 AM - 4:00 PM - WALK IN   Follow up:  Follow up if needed

## 2021-05-11 ENCOUNTER — Other Ambulatory Visit: Payer: Self-pay

## 2021-06-03 ENCOUNTER — Other Ambulatory Visit (HOSPITAL_COMMUNITY): Payer: Self-pay

## 2021-06-11 IMAGING — DX DG CHEST 1V PORT
1 series · 1 of 1 positions shown · non-contrast
Comparison: Prior chest x-ray 06/25/2019

CLINICAL DATA: Short of breath, positive COVID

EXAM:
PORTABLE CHEST 1 VIEW

[chest ap]
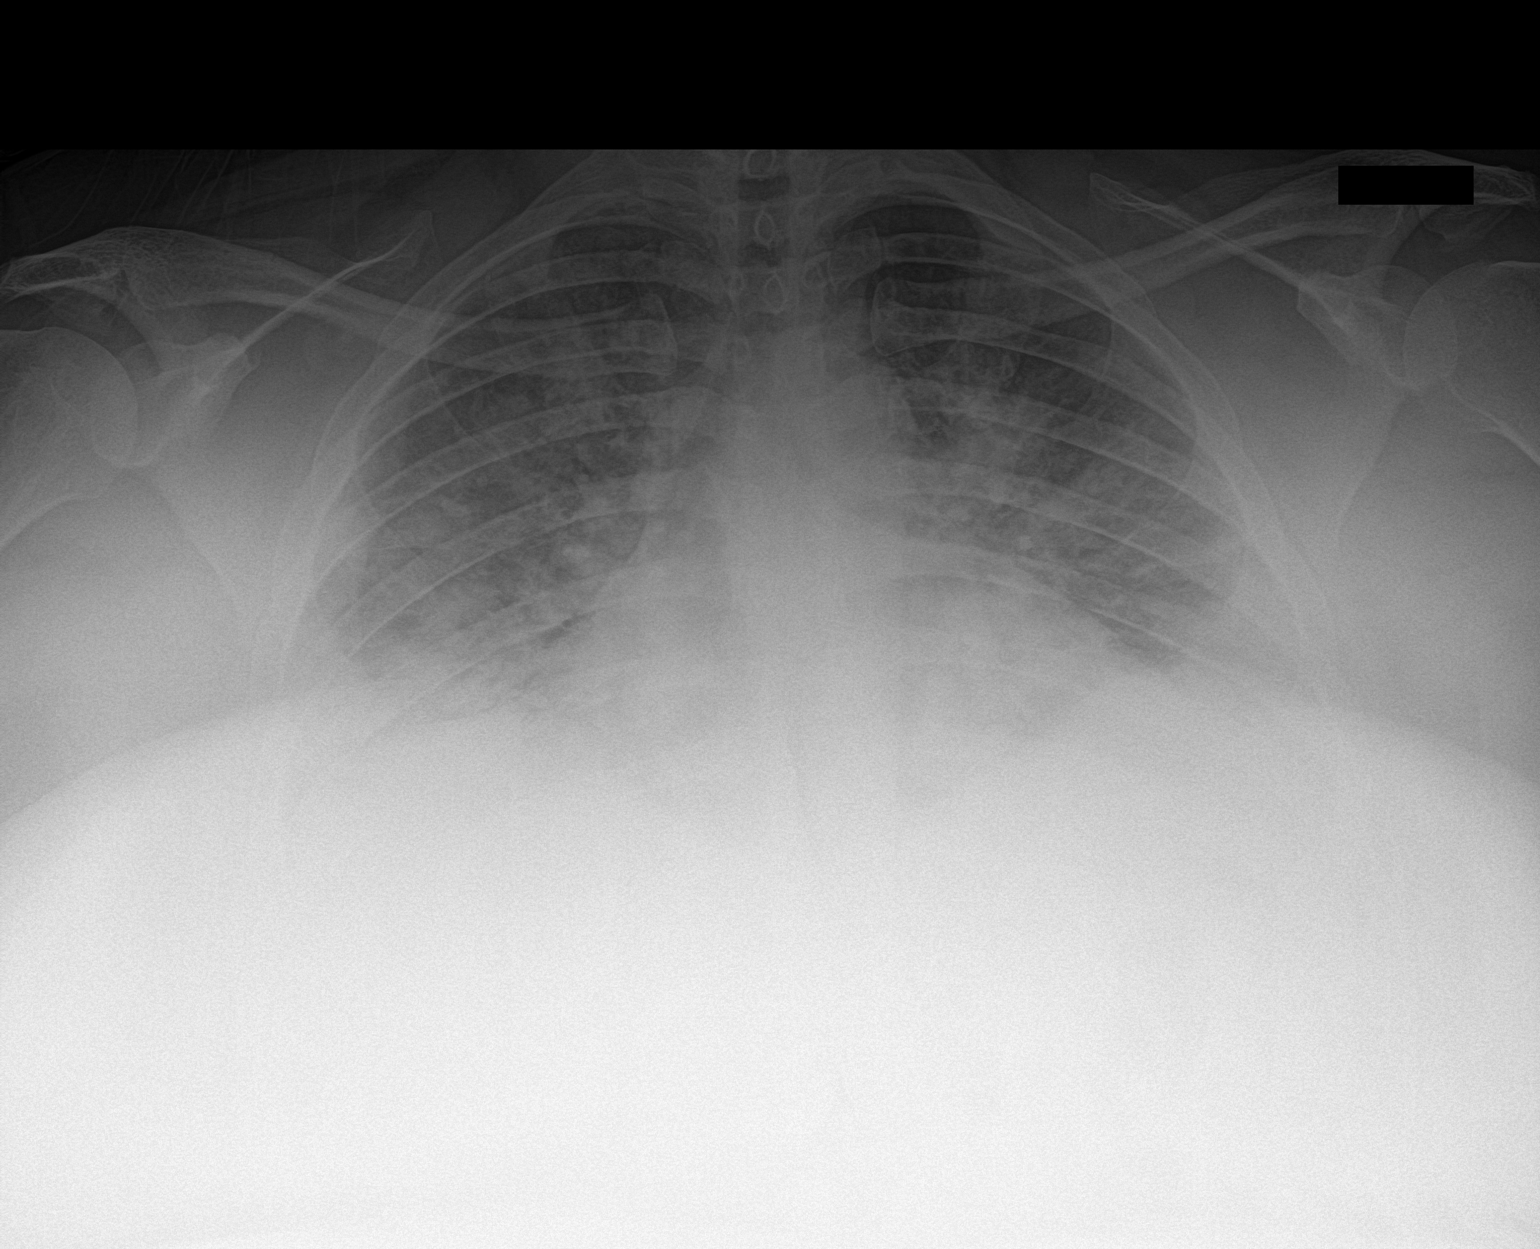

[1 of 1 positions shown; findings below may reference images not displayed]

FINDINGS: Interval development of patchy interstitial and airspace opacities
bilaterally. Stable cardiac and mediastinal contours given portable
frontal technique. Inspiratory volumes are low. No pneumothorax or
pleural effusion. No acute osseous abnormality.
IMPRESSION: 1. Developing bilateral interstitial and airspace opacities
concerning for COVID pneumonia.
2. In the appropriate clinical setting, pulmonary edema could appear
similar. This is considered less likely given patient's age and
known COVID positive status.
3. Low inspiratory volumes.

## 2021-06-12 IMAGING — DX DG CHEST 1V PORT
2 series · 2 of 2 positions shown · non-contrast
Comparison: 08/01/2020; 06/25/2019

CLINICAL DATA: 71QFK-71 infection.

EXAM:
PORTABLE CHEST 1 VIEW

[chest ap (1 of 2)]
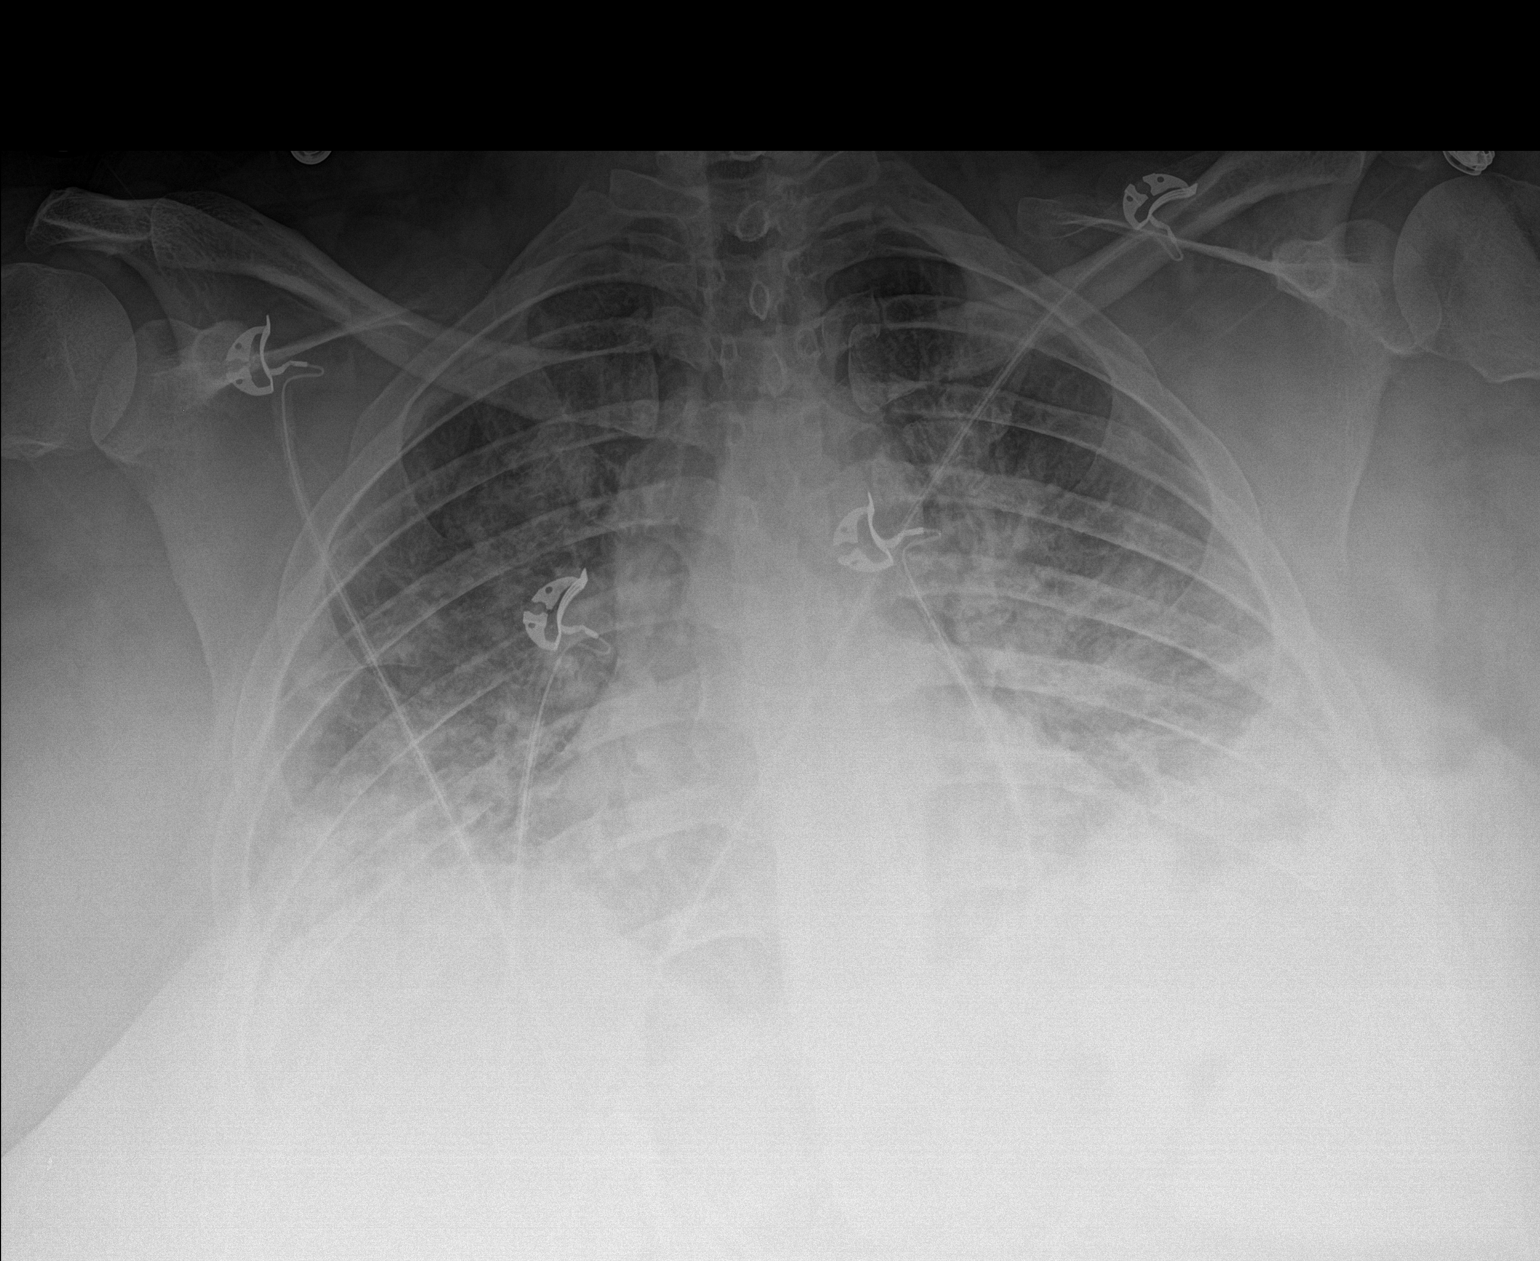

[chest ap (2 of 2)]
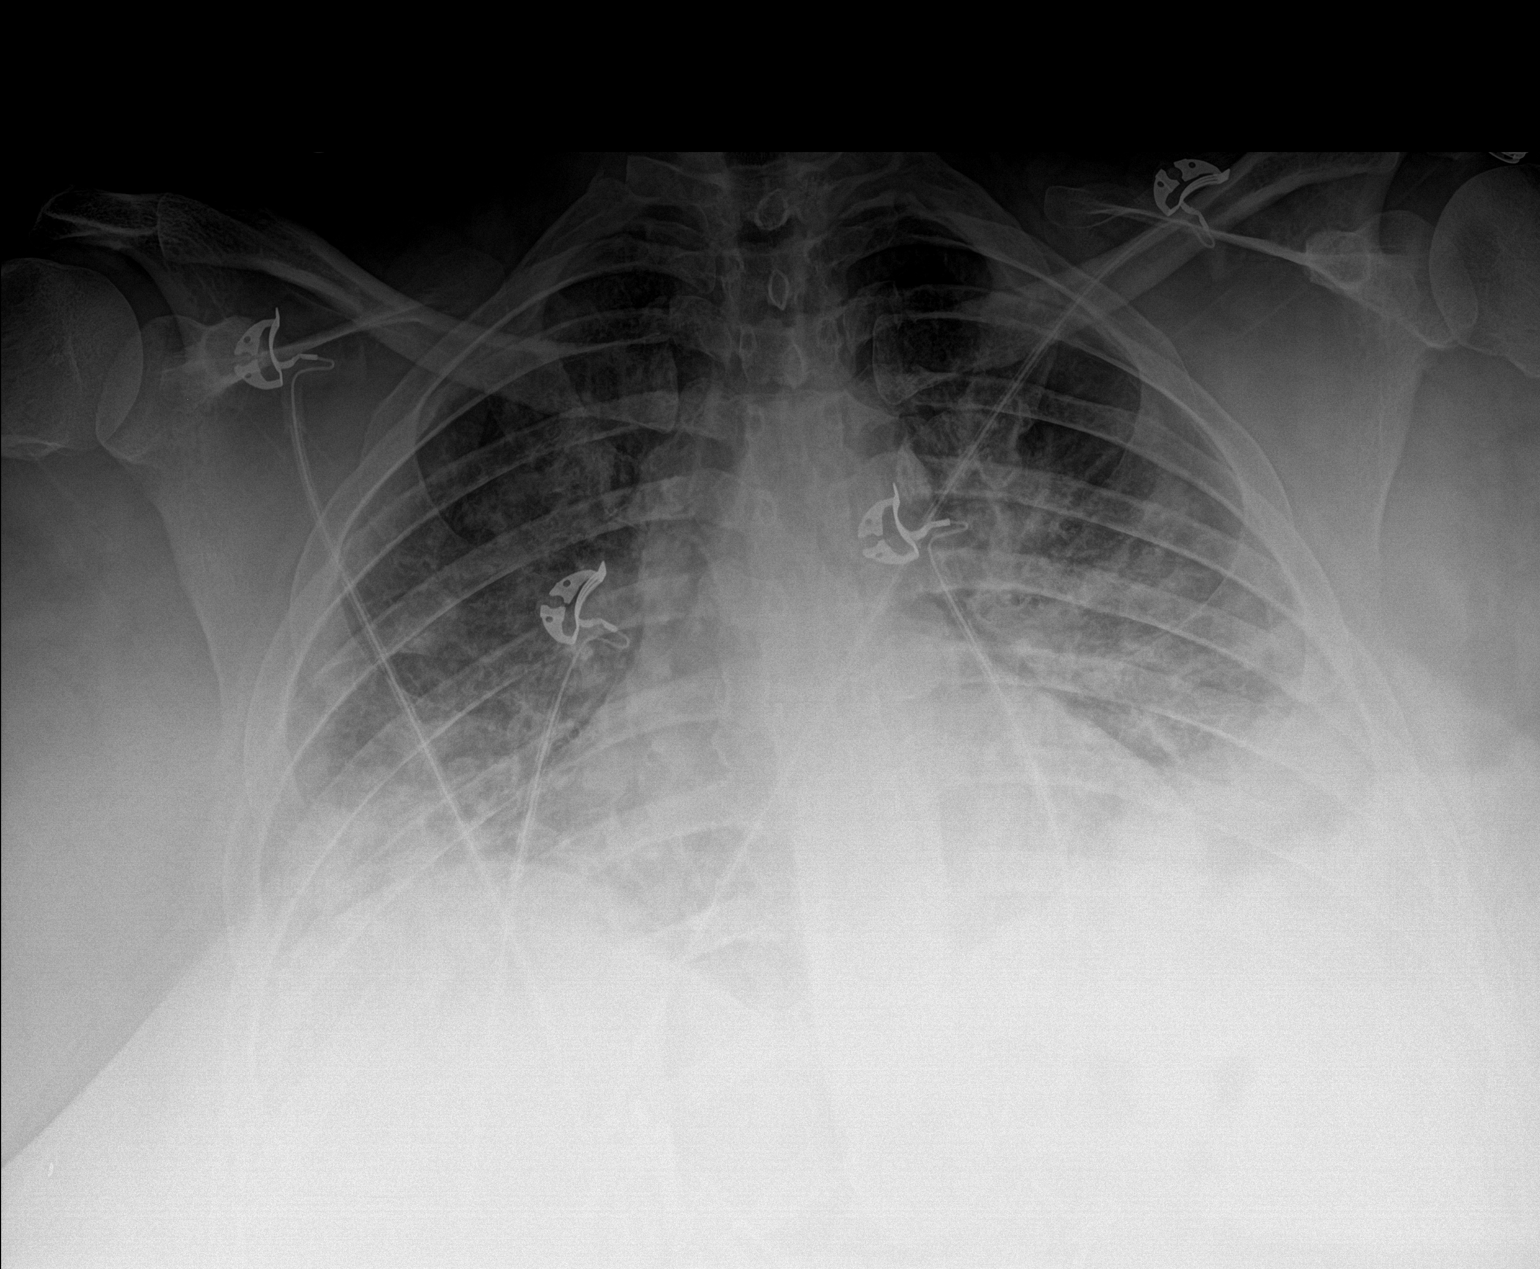

[2 of 2 positions shown; findings below may reference images not displayed]

FINDINGS: Examination is again significantly limited due to patient body
habitus.

Grossly unchanged cardiac silhouette and mediastinal contours.
Minimally improved aeration of the bilateral upper lungs with
persistent rather extensive nodular airspace opacities with basilar
and peripheral predominance. No definite pleural effusion. No
pneumothorax. No definite evidence of edema. No acute osseous
abnormalities.
IMPRESSION: Minimally improved aeration of the bilateral upper lungs with
persistent extensive bilateral nodular airspace opacities again
worrisome for multifocal infection compatible with provided history
of 71QFK-71.

## 2021-06-22 ENCOUNTER — Other Ambulatory Visit: Payer: Self-pay

## 2021-06-23 ENCOUNTER — Ambulatory Visit: Payer: Self-pay | Admitting: Nurse Practitioner

## 2021-07-18 ENCOUNTER — Ambulatory Visit: Payer: Self-pay | Admitting: Nurse Practitioner

## 2021-08-14 NOTE — Progress Notes (Deleted)
    GYNECOLOGY OFFICE PROCEDURE NOTE  Lacey Whitaker is a 35 y.o. [phone] here for IUD removal. No GYN concerns.  Last pap smear was on 02/2020 and was normal/hpv negative  IUD Removal  Patient identified, informed consent performed, consent signed.  Patient was in the dorsal lithotomy position, normal external genitalia was noted.  A speculum was placed in the patient's vagina, normal discharge was noted, no lesions. The cervix was visualized, no lesions, no abnormal discharge.  The strings of the IUD were grasped and pulled using ring forceps. {Blank single:19197::"The IUD was removed in its entirety. ","The strings of the IUD were not visualized, so Kelly forceps were introduced into the endometrial cavity and the IUD was grasped and removed in its entirety. ","The IUD was unable to be removed"}  Patient tolerated the procedure well.    Patient {Blank single:19197::"will use *** for contraception.","plans for pregnancy soon and she was told to avoid teratogens, take PNV and folic acid."}  Routine preventative health maintenance measures emphasized.   Milas Hock, MD, FACOG Obstetrician & Gynecologist, [facility] for [facility], [facility]

## 2021-08-16 ENCOUNTER — Ambulatory Visit: Payer: Medicaid Other | Admitting: Obstetrics and Gynecology

## 2021-08-18 ENCOUNTER — Other Ambulatory Visit: Payer: Self-pay

## 2021-08-18 ENCOUNTER — Other Ambulatory Visit: Payer: Self-pay | Admitting: Physician Assistant

## 2021-08-18 ENCOUNTER — Ambulatory Visit
Admission: RE | Admit: 2021-08-18 | Discharge: 2021-08-18 | Disposition: A | Discharge status: Home / Self Care | Payer: Medicaid Other | Source: Ambulatory Visit | Attending: Physician Assistant | Admitting: Physician Assistant

## 2021-08-18 DIAGNOSIS — M25562 Pain in left knee: Secondary | ICD-10-CM

## 2021-09-04 IMAGING — CR DG CHEST 2V
2 series · 2 of 2 positions shown · non-contrast
Comparison: September 22, 2020

CLINICAL DATA: History of COVID positive in July 2020.
Currently asymptomatic.

EXAM:
CHEST - 2 VIEW

[w chest pa]
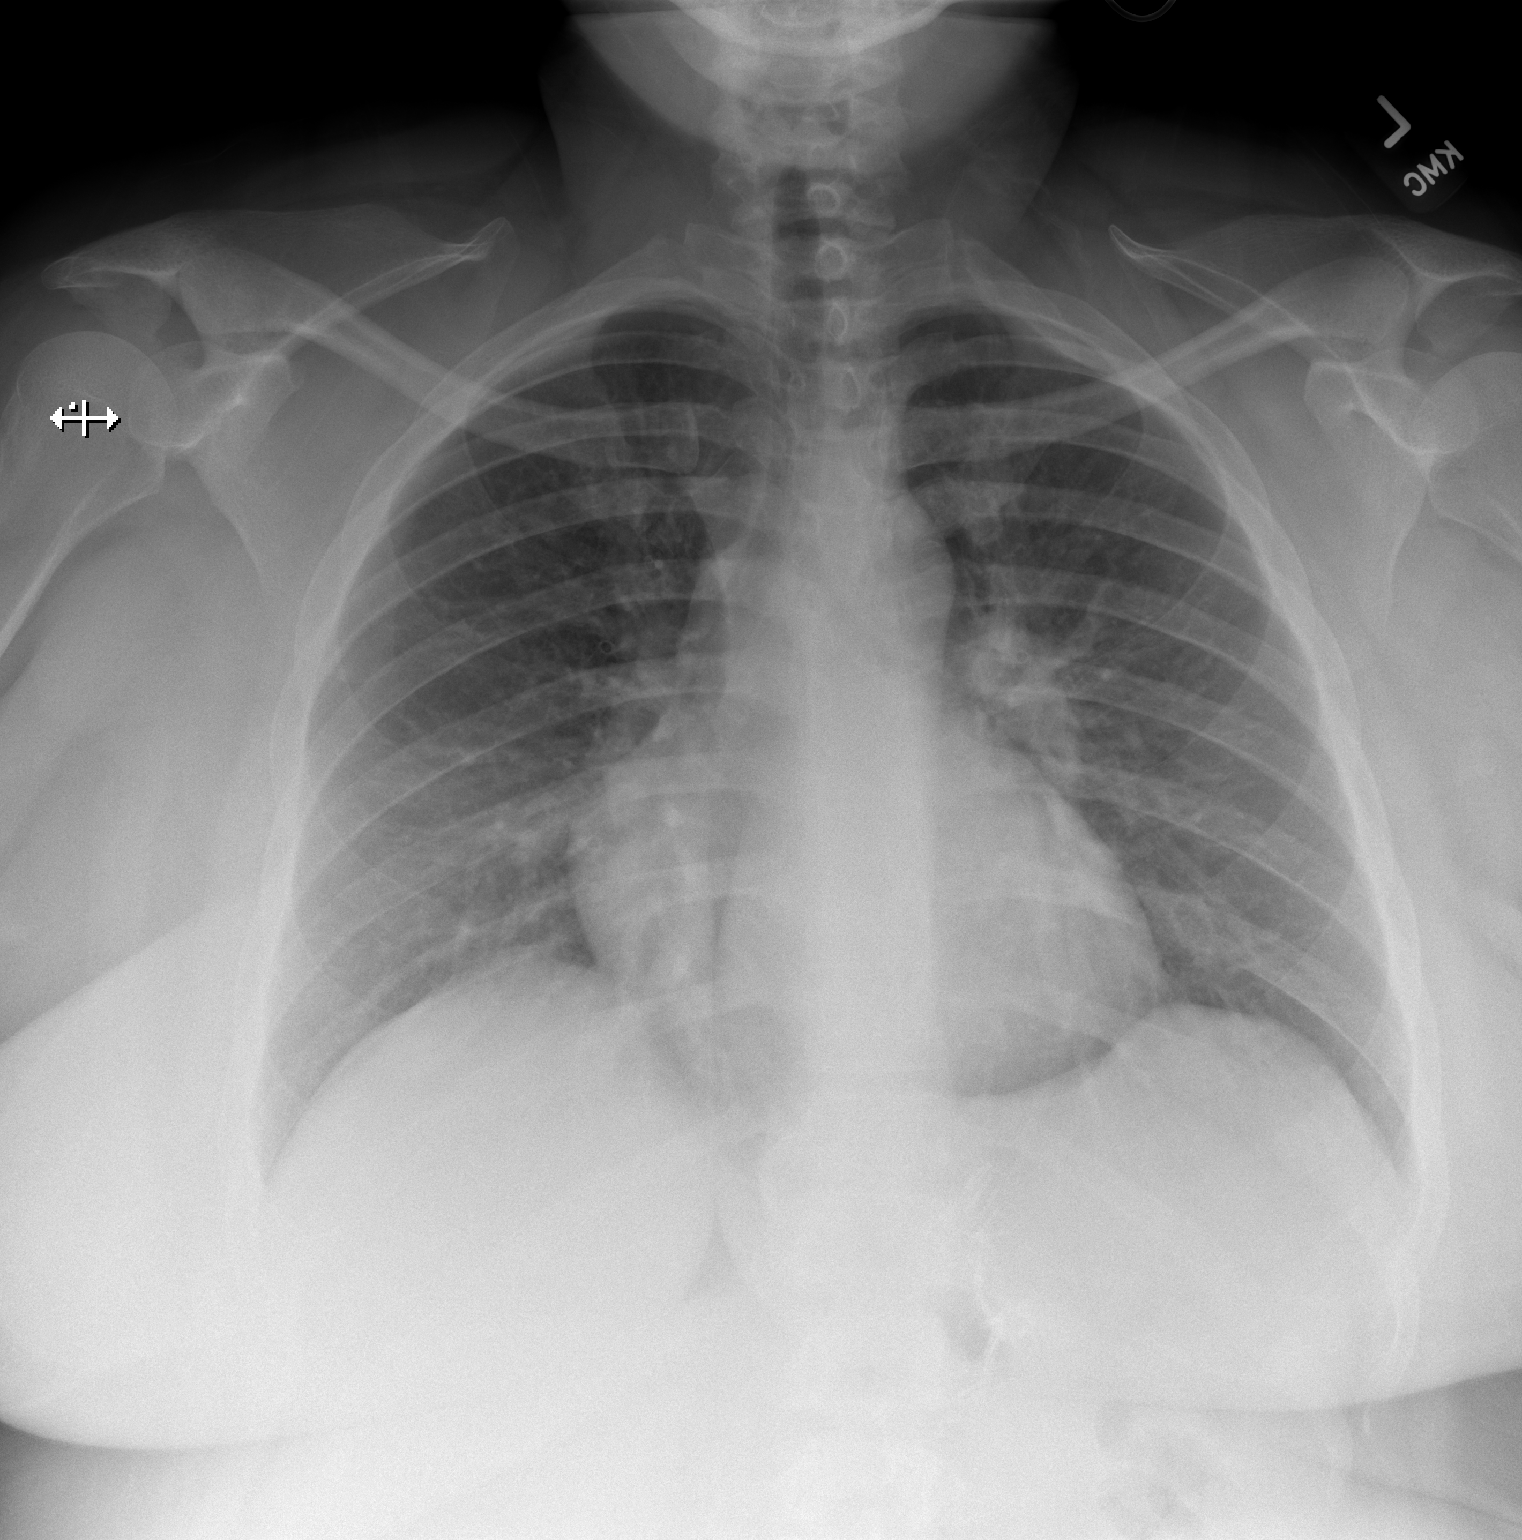

[w chest lat]
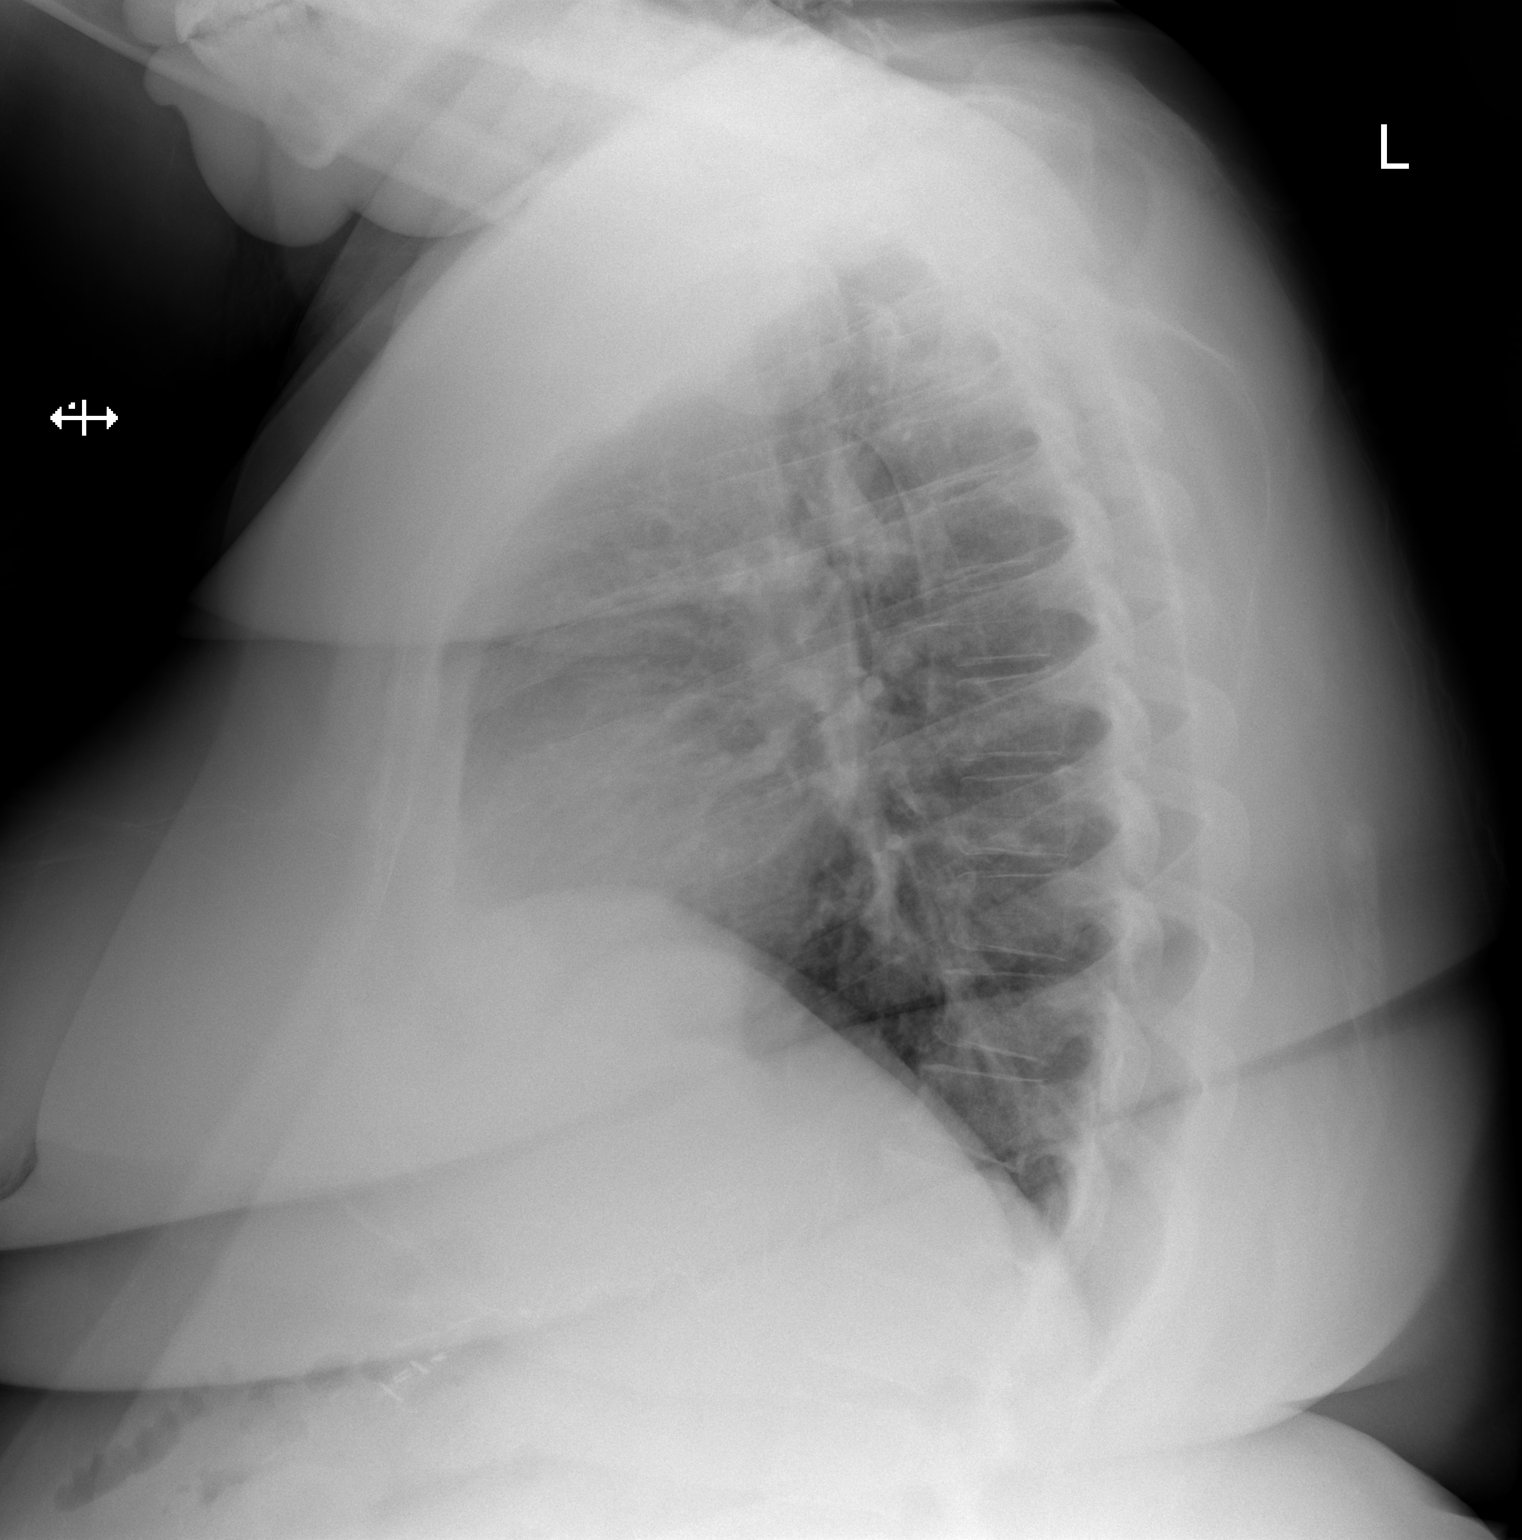

[2 of 2 positions shown; findings below may reference images not displayed]

FINDINGS: There is no evidence of acute infiltrate, pleural effusion or
pneumothorax. The heart size and mediastinal contours are within
normal limits. The visualized skeletal structures are unremarkable.
Multiple surgical clips are seen overlying the medial aspect of the
left upper quadrant.
IMPRESSION: No active cardiopulmonary disease.

## 2021-09-27 ENCOUNTER — Ambulatory Visit: Payer: Medicaid Other | Admitting: Advanced Practice Midwife

## 2021-09-27 NOTE — Progress Notes (Deleted)
   Subjective:     Lacey Whitaker is a 35 y.o. female here at [facility] *** for a routine exam.  Current complaints: ***.  Personal health questionnaire reviewed: {yes/no:9010}.  Do you have a primary care provider? *** Do you feel safe at home? ***  Flowsheet Row Office Visit from 03/16/2020 in [facility]  PHQ-2 Total Score 0       Health Maintenance Due  Topic Date Due   COVID-19 Vaccine (1) Never done   Pneumococcal Vaccine 44-16 Years old (1 - PCV) Never done   FOOT EXAM  Never done   OPHTHALMOLOGY EXAM  Never done   URINE MICROALBUMIN  Never done   Hepatitis C Screening  Never done   HEMOGLOBIN A1C  07/28/2020   INFLUENZA VACCINE  06/20/2021     Risk factors for chronic health problems: Smoking: Alchohol/how much: Pt BMI: There is no height or weight on file to calculate BMI.   Gynecologic History No LMP recorded. (Menstrual status: IUD). Contraception: {method:5051} Last Pap: 03/16/2020. Results were: normal with negative hpv. Last mammogram: ***. Results were: {norm/abn:16337}  Obstetric History OB History  Gravida Para Term Preterm AB Living  3 3 3     3   SAB IAB Ectopic Multiple Live Births          2    # Outcome Date GA Lbr Len/2nd Weight Sex Delivery Anes PTL Lv  3 Term 09/02/11 [redacted]w[redacted]d  7 lb 14.6 oz (3.59 kg) F CS-LTranv   LIV  2 Term 01/28/07    03/30/07 EPI    1 Term 02/23/05    F CS-LTranv EPI N LIV     Birth Comments: C/S for fetal distress and PIH     {Common ambulatory SmartLinks:19316}  Review of Systems {ros; complete:30496}    Objective:   There were no vitals taken for this visit. VS reviewed, nursing note reviewed,  Constitutional: well developed, well nourished, no distress HEENT: normocephalic CV: normal rate Pulm/chest wall: normal effort Breast Exam:  ***Deferred with low risks and shared decision making, discussed recommendation to start mammogram between 40-50 yo/ exam performed: right breast normal  without mass, skin or nipple changes or axillary nodes, left breast normal without mass, skin or nipple changes or axillary nodes Abdomen: soft Neuro: alert and oriented x 3 Skin: warm, dry Psych: affect normal Pelvic exam: ***Deferred/ Performed: Cervix pink, visually closed, without lesion, scant white creamy discharge, vaginal walls and external genitalia normal Bimanual exam: Cervix 0/long/high, firm, anterior, neg CMT, uterus nontender, nonenlarged, adnexa without tenderness, enlargement, or mass       Assessment/Plan:   There are no diagnoses linked to this encounter.      Follow up in: {1-10:13787:::0} {time; units:19136:::0} or as needed.   04/25/05, CNM 1:11 PM

## 2021-12-13 ENCOUNTER — Emergency Department (HOSPITAL_COMMUNITY)
Admission: EM | Admit: 2021-12-13 | Discharge: 2021-12-13 | Disposition: A | Discharge status: Home / Self Care | Payer: Medicaid Other | Attending: Emergency Medicine | Admitting: Emergency Medicine

## 2021-12-13 DIAGNOSIS — K6289 Other specified diseases of anus and rectum: Secondary | ICD-10-CM

## 2021-12-13 DIAGNOSIS — K644 Residual hemorrhoidal skin tags: Secondary | ICD-10-CM | POA: Diagnosis not present

## 2021-12-13 MED ORDER — HYDROCORTISONE ACETATE 25 MG RE SUPP: 25.0000 mg | Freq: Two times a day (BID) | RECTAL | 0 refills | Status: DC

## 2021-12-13 NOTE — ED Notes (Signed)
Pt is here due to worsening pain to rectal area. Hx of hemorrhoids and pt states she went to PCP and sent home with a cream. Reports no pain relief from topical pain medication. Reports some bleeding when she has a BM. Ambulatory from triage. Will continue to monitor.

## 2021-12-13 NOTE — Discharge Instructions (Addendum)
Please take a dose of MiraLAX in the morning and a dose at night.  It is important that you are drinking enough water. If you are not drinking enough water fluids then this will not work.   If you develop fevers, abdominal pain or have other concerns please seek additional medical care and evaluation.  While in the emergency room your blood pressure was significantly elevated.  You need to get this rechecked in the next month, if it remains high you need to be on medication for this.  Untreated high blood pressure can lead to permanent organ damage and death.  The switch to from the cream to suppositories for the hydrocortisone. Please try using these.  Please try to take sitz bath's at least 3-4 times a day.

## 2021-12-13 NOTE — ED Triage Notes (Signed)
Pt. Stated, Ive had hemorroids for about a month and the last 2 days they have been unbearable. My Dr sent me some cream and said if the pain did not leave to come to ER.

## 2021-12-13 NOTE — ED Provider Notes (Signed)
[facility] EMERGENCY DEPARTMENT Provider Note   CSN: [id number] Arrival date & time: 12/13/21  6237     History  Chief Complaint  Patient presents with   Hemorrhoids    Lacey Whitaker is a 36 y.o. female who presents today for evaluation of hemorrhoids. She states that on and off for many years she has been dealing with what she suspects is hemorrhoids.  Over the past month she has had occasional feelings like it is tearing when she is having a bowel movement, along with blood on the tissue when she wipes.  She does not take any blood thinning medications. She states that her PCP called her in some medicines to a pharmacy however over the past 2 days her pain is gotten much worse.  She has recently restarted taking MiraLAX to try and help soften her bowel movements.  She denies any fevers and otherwise feels well.  States that when she tried to feel that area she felt like there was something coming out of her rectum.  HPI     Home Medications Prior to Admission medications   Medication Sig Start Date End Date Taking? Authorizing Provider  hydrocortisone (ANUSOL-HC) 25 MG suppository Place 1 suppository (25 mg total) rectally 2 (two) times daily. 12/13/21  Yes Cristina Gong, PA-C  albuterol (PROVENTIL HFA) 108 (90 Base) MCG/ACT inhaler Inhale 2 puffs into the lungs every 6 (six) hours as needed for up to 8 days for wheezing or shortness of breath. 10/21/20 10/29/20  Ivonne Andrew, NP  budesonide-formoterol (SYMBICORT) 80-4.5 MCG/ACT inhaler INHALE 2 PUFFS INTO THE LUNGS 2 (TWO) TIMES DAILY. 09/20/20 09/20/21  Ivonne Andrew, NP  ibuprofen (ADVIL) 600 MG tablet Take 1 tablet (600 mg total) by mouth every 6 (six) hours as needed. 03/17/21   Merrilee Jansky, MD  levonorgestrel (MIRENA) 20 MCG/24HR IUD 1 each by Intrauterine route once.    [provider]  lisinopril-hydrochlorothiazide (ZESTORETIC) 20-12.5 MG tablet Take 2 tablets by mouth daily. need  to see PCP for additional refills 05/09/21 06/10/21  Ivonne Andrew, NP  montelukast (SINGULAIR) 10 MG tablet TAKE 1 TABLET (10 MG TOTAL) BY MOUTH AT BEDTIME. 09/20/20 09/20/21  Ivonne Andrew, NP  omeprazole (PRILOSEC) 20 MG capsule Take 1 capsule (20 mg total) by mouth daily. 07/24/20   Georgetta Haber, NP  predniSONE (DELTASONE) 10 MG tablet Take 4 tabs for 2 days, then 3 tabs for 2 days, then 2 tabs for 2 days, then 1 tab for 2 days, then stop 04/22/21   Ivonne Andrew, NP  tiZANidine (ZANAFLEX) 2 MG tablet Take 2 mg by mouth every 6 (six) hours as needed for muscle spasms.    [provider]  Vitamin D, Ergocalciferol, (DRISDOL) 1.25 MG (50000 UT) CAPS capsule Take 50,000 Units by mouth every Monday.     [provider]      Allergies    Patient has no known allergies.    Review of Systems   Review of Systems See hpi  Physical Exam Updated Vital Signs BP (!) 161/74    Pulse 99    Temp 98.4 F (36.9 C) (Oral)    Resp 20    SpO2 98%  Physical Exam Vitals and nursing note reviewed. Exam conducted with a chaperone present.  Constitutional:      General: She is not in acute distress.    Appearance: She is not ill-appearing.  HENT:     Head: Atraumatic.  Eyes:     Conjunctiva/sclera: Conjunctivae normal.  Cardiovascular:     Rate and Rhythm: Normal rate.  Pulmonary:     Effort: Pulmonary effort is normal. No respiratory distress.  Abdominal:     General: There is no distension.  Genitourinary:    Comments: There are 2 subcentimeter nonthrombosed external hemorrhoids visible.  No active bleeding. Musculoskeletal:     Cervical back: Normal range of motion and neck supple.     Comments: No obvious acute injury  Skin:    General: Skin is warm.  Neurological:     Mental Status: She is alert.     Comments: Awake and alert, answers all questions appropriately.  Speech is not slurred.  Psychiatric:        Mood and Affect: Mood normal.        Behavior: Behavior  normal.    ED Results / Procedures / Treatments   Labs (all labs ordered are listed, but only abnormal results are displayed) Labs Reviewed - No data to display  EKG None  Radiology No results found.  Procedures Procedures    Medications Ordered in ED Medications - No data to display  ED Course/ Medical Decision Making/ A&P     Medical Decision Making Patient presents today for evaluation of painful external hemorrhoids.  She was given a prescription by her primary care doctor for hydrocortisone cream and lidocaine topical for this yesterday however reports her pain is still present. Exam consistent with nonthrombosed external hemorrhoids. She has been holding her bowel movements to try and avoid pain.  We discussed that this is not recommended. Instructed on MiraLAX, increasing p.o. fluids, sitz bath's. Narcotic pain medicine is not indicated as this would contribute to constipation worsening the problem. Will change her hydrocortisone from a cream to suppositories to ensure appropriate placement. She is given general surgery follow-up.  PCP follow-up.  Problems Addressed: External hemorrhoids: acute illness or injury  Amount and/or Complexity of Data Reviewed Labs:     Details: Not indicated Radiology:     Details: Not indicated  Risk OTC drugs. Prescription drug management.    Return precautions were discussed with patient who states their understanding.  At the time of discharge patient denied any unaddressed complaints or concerns.  Patient is agreeable for discharge home.  Note: Portions of this report may have been transcribed using voice recognition software. Every effort was made to ensure accuracy; however, inadvertent computerized transcription errors may be present                           Final Clinical Impression(s) / ED Diagnoses Final diagnoses:  External hemorrhoids  Rectal pain    Rx / DC Orders ED Discharge Orders          Ordered     hydrocortisone (ANUSOL-HC) 25 MG suppository  2 times daily        12/13/21 1549              Norman Clay 12/13/21 1912    Gloris Manchester, MD 12/13/21 2005

## 2022-09-07 ENCOUNTER — Ambulatory Visit: Payer: Medicaid Other

## 2022-09-13 ENCOUNTER — Encounter (HOSPITAL_COMMUNITY): Payer: Self-pay | Admitting: Emergency Medicine

## 2022-09-13 ENCOUNTER — Ambulatory Visit (HOSPITAL_COMMUNITY)
Admission: EM | Admit: 2022-09-13 | Discharge: 2022-09-13 | Disposition: A | Discharge status: Home / Self Care | Payer: Medicaid Other | Attending: Physician Assistant | Admitting: Physician Assistant

## 2022-09-13 DIAGNOSIS — U071 COVID-19: Secondary | ICD-10-CM | POA: Insufficient documentation

## 2022-09-13 DIAGNOSIS — I10 Essential (primary) hypertension: Secondary | ICD-10-CM | POA: Diagnosis not present

## 2022-09-13 DIAGNOSIS — J4521 Mild intermittent asthma with (acute) exacerbation: Secondary | ICD-10-CM | POA: Insufficient documentation

## 2022-09-13 DIAGNOSIS — J069 Acute upper respiratory infection, unspecified: Secondary | ICD-10-CM | POA: Insufficient documentation

## 2022-09-13 LAB — RESP PANEL BY RT-PCR (FLU A&B, COVID) ARPGX2
Influenza A by PCR: NEGATIVE
Influenza B by PCR: NEGATIVE
SARS Coronavirus 2 by RT PCR: POSITIVE — AB

## 2022-09-13 MED ORDER — BENZONATATE 100 MG PO CAPS: 100.0000 mg | ORAL_CAPSULE | Freq: Three times a day (TID) | ORAL | 0 refills | Status: DC

## 2022-09-13 MED ORDER — PREDNISONE 20 MG PO TABS: 40.0000 mg | ORAL_TABLET | Freq: Every day | ORAL | 0 refills | Status: AC

## 2022-09-13 MED ORDER — AMLODIPINE BESYLATE 5 MG PO TABS: 5.0000 mg | ORAL_TABLET | Freq: Every day | ORAL | 2 refills | Status: DC

## 2022-09-13 NOTE — Discharge Instructions (Addendum)
I believe that you have a virus.  Please monitor your MyChart for results.  We will contact you if you are positive for flu or COVID.  Continue using albuterol inhaler every 4-6 hours as needed.  Take prednisone 40 mg in the morning.  Do not take NSAIDs with this medication including aspirin, ibuprofen/Advil, naproxen/Aleve.  Use Tessalon for cough.  Use over-the-counter medication including Mucinex, Flonase, Tylenol for symptom relief.  Make sure you rest and drink plenty of fluid.  If anything worsens or if your symptoms are improving please return.  Your blood pressure is elevated.  Please start amlodipine 5 mg daily.  Follow-up with your primary care next week as scheduled.  If you develop any chest pain, shortness of breath, headache, vision change, dizziness in setting of high blood pressure you need to go to the emergency room.

## 2022-09-13 NOTE — ED Triage Notes (Signed)
Pt c/o cough, congestion, ear fullness, body aches, headaches that started last night. Hasnt taken any medications today.

## 2022-09-13 NOTE — ED Provider Notes (Signed)
[place]    CSN: [id number] Arrival date & time: 09/13/22  1626      History   Chief Complaint Chief Complaint  Patient presents with   Cough   Nasal Congestion    HPI Lacey Whitaker is a 36 y.o. female.   Patient presents today with a 24-hour history of body aches, subjective fever, congestion, cough, headache, fatigue, malaise.  Denies any chest pain, nausea, vomiting, diarrhea.  She has been using her prescribed albuterol inhaler without improvement of symptoms.  She has a history of asthma.  She has not been taking any over-the-counter medication for symptom management.  Denies any known sick contacts.  She has had COVID in the past with last episode over a year ago.  She has not had COVID vaccines.  Denies any recent antibiotic or steroid use.  She is having difficulty with daily activities as a result of symptoms.  Blood pressure was elevated today.  Patient reports that she does have a history of hypertension and is not currently taking any antihypertensive medications as she was having side effects with lisinopril and so her PCP stopped this and will consider adjusting it/starting something different at her next appointment in a few weeks.  She does report a mild headache but attributes this to current illness.  Denies any significant chest pain, shortness of breath, headache, vision change, dizziness.  She has not been taking decongestants or regular NSAIDs.    Past Medical History:  Diagnosis Date   Asthma    Cyst, mediastinum    pt. reports having the cyst removed last year and has reoccured   Diabetes mellitus type II, controlled ([place]) 01/27/2020   Iron deficiency anemia due to chronic blood loss    Morbid obesity ([place])    s/p gastric bypass surgery in 2009   Pregnancy induced hypertension    2006    Patient Active Problem List   Diagnosis Date Noted   Physical deconditioning 09/20/2020   History of COVID-19 09/20/2020   Pneumonia due to COVID-19  virus 08/16/2020   Shortness of breath 08/16/2020   Snoring 08/16/2020   COVID-19 08/01/2020   Acute respiratory failure with hypoxia ([place])    Diabetes mellitus type II, controlled ([place]) 01/27/2020   Asthma, chronic    HTN (hypertension)    Iron deficiency anemia due to chronic blood loss    Obesities, morbid ([place]) 08/10/2011    Past Surgical History:  Procedure Laterality Date   BONE CYST EXCISION  2010   CESAREAN SECTION     CESAREAN SECTION  09/02/2011   Procedure: CESAREAN SECTION;  Surgeon: Delice Lesch, MD;  Location: [facility];  Service: Gynecology;  Laterality: N/A;   GASTRIC BYPASS  05/2008   GASTRIC BYPASS  2009   WISDOM TOOTH EXTRACTION  09/2010    OB History     Gravida  3   Para  3   Term  3   Preterm      AB      Living  3      SAB      IAB      Ectopic      Multiple      Live Births  2            Home Medications    Prior to Admission medications   Medication Sig Start Date End Date Taking? Authorizing Provider  amLODipine (NORVASC) 5 MG tablet Take 1 tablet (5 mg total) by mouth daily. 09/13/22  Yes Colten Desroches K, PA-C  benzonatate (TESSALON) 100 MG capsule Take 1 capsule (100 mg total) by mouth every 8 (eight) hours. 09/13/22  Yes Sanaiyah Kirchhoff K, PA-C  furosemide (LASIX) 20 MG tablet Take 1 tablet by mouth daily. 06/22/21  Yes [provider]  gabapentin (NEURONTIN) 100 MG capsule Take 100 mg by mouth in the morning, at noon, and at bedtime. 01/30/22  Yes [provider]  predniSONE (DELTASONE) 20 MG tablet Take 2 tablets (40 mg total) by mouth daily for 4 days. 09/13/22 09/17/22 Yes Schneur Crowson K, PA-C  albuterol (PROVENTIL HFA) 108 (90 Base) MCG/ACT inhaler Inhale 2 puffs into the lungs every 6 (six) hours as needed for up to 8 days for wheezing or shortness of breath. 10/21/20 10/29/20  Fenton Foy, NP  budesonide-formoterol (SYMBICORT) 80-4.5 MCG/ACT inhaler INHALE 2 PUFFS INTO THE LUNGS 2 (TWO) TIMES DAILY.  09/20/20 09/20/21  Fenton Foy, NP  buPROPion (WELLBUTRIN SR) 150 MG 12 hr tablet Take 1 tablet by mouth in the morning and at bedtime.    [provider]  ibuprofen (ADVIL) 600 MG tablet Take 1 tablet (600 mg total) by mouth every 6 (six) hours as needed. 03/17/21   Chase Picket, MD  levonorgestrel (MIRENA) 20 MCG/24HR IUD 1 each by Intrauterine route once.    [provider]  lisinopril-hydrochlorothiazide (ZESTORETIC) 20-12.5 MG tablet Take 2 tablets by mouth daily. need to see PCP for additional refills 05/09/21 06/10/21  Fenton Foy, NP  tiZANidine (ZANAFLEX) 2 MG tablet Take 2 mg by mouth every 6 (six) hours as needed for muscle spasms.    [provider]    Family History Family History  Problem Relation Age of Onset   Asthma Mother    Asthma Brother     Social History Social History   Tobacco Use   Smoking status: Some Days    Types: Cigars   Smokeless tobacco: Never   Tobacco comments:    Smokes 2 Black & Milds daily  Vaping Use   Vaping Use: Never used  Substance Use Topics   Alcohol use: Yes    Comment: occ   Drug use: No     Allergies   Patient has no known allergies.   Review of Systems Review of Systems  Constitutional:  Positive for activity change, appetite change, fatigue and fever.  HENT:  Positive for congestion and sinus pressure. Negative for sneezing and sore throat.   Respiratory:  Positive for cough. Negative for shortness of breath.   Cardiovascular:  Negative for chest pain.  Gastrointestinal:  Negative for abdominal pain, diarrhea, nausea and vomiting.  Musculoskeletal:  Positive for arthralgias and myalgias.  Neurological:  Positive for headaches. Negative for dizziness and light-headedness.     Physical Exam Triage Vital Signs ED Triage Vitals  Enc Vitals Group     BP 09/13/22 1658 (!) 194/103     Pulse Rate 09/13/22 1658 (!) 102     Resp 09/13/22 1658 20     Temp 09/13/22 1658 100.1 F (37.8 C)      Temp Source 09/13/22 1658 Oral     SpO2 09/13/22 1658 94 %     Weight --      Height --      Head Circumference --      Peak Flow --      Pain Score 09/13/22 1655 10     Pain Loc --      Pain Edu? --  Excl. in GC? --    No data found.  Updated Vital Signs BP (!) 168/102 (BP Location: Right Arm)   Pulse (!) 102   Temp 100.1 F (37.8 C) (Oral)   Resp 20   SpO2 94%   Visual Acuity Right Eye Distance:   Left Eye Distance:   Bilateral Distance:    Right Eye Near:   Left Eye Near:    Bilateral Near:     Physical Exam Vitals reviewed.  Constitutional:      General: She is awake. She is not in acute distress.    Appearance: Normal appearance. She is well-developed. She is not ill-appearing.     Comments: Very pleasant female appears stated age in no acute distress sitting on exam room table  HENT:     Head: Normocephalic and atraumatic.     Right Ear: Tympanic membrane, ear canal and external ear normal. Tympanic membrane is not erythematous or bulging.     Left Ear: Tympanic membrane, ear canal and external ear normal. Tympanic membrane is not erythematous or bulging.     Nose:     Right Sinus: No maxillary sinus tenderness or frontal sinus tenderness.     Left Sinus: No maxillary sinus tenderness or frontal sinus tenderness.     Mouth/Throat:     Pharynx: Uvula midline. No oropharyngeal exudate or posterior oropharyngeal erythema.  Cardiovascular:     Rate and Rhythm: Normal rate and regular rhythm.     Heart sounds: Normal heart sounds, S1 normal and S2 normal. No murmur heard. Pulmonary:     Effort: Pulmonary effort is normal.     Breath sounds: Normal breath sounds. No wheezing, rhonchi or rales.     Comments: Clear to auscultation bilaterally Psychiatric:        Behavior: Behavior is cooperative.      UC Treatments / Results  Labs (all labs ordered are listed, but only abnormal results are displayed) Labs Reviewed  RESP PANEL BY RT-PCR (FLU A&B,  COVID) [id number]    EKG   Radiology No results found.  Procedures Procedures (including critical care time)  Medications Ordered in UC Medications - No data to display  Initial Impression / Assessment and Plan / UC Course  I have reviewed the triage vital signs and the nursing notes.  Pertinent labs & imaging results that were available during my care of the patient were reviewed by me and considered in my medical decision making (see chart for details).     Suspect viral etiology of symptoms.  No evidence of acute infection on physical exam that would warrant initiation of antibiotics.  Flu and COVID testing was obtained-results pending.  Patient would benefit from antiviral medication if positive for either flu or COVID.  She is within the window of effectiveness for Tamiflu.  She does not have any recent blood work so would need to consider molnupiravir as long as she can confirm that she is not pregnant if she is positive for [place].  I am concerned that her asthma has been flared as result of this viral illness and so she was started on prednisone burst of 40 mg for 4 days.  Discussed that she is not to take NSAIDs with this medication.  Can use Tylenol, Mucinex, Flonase.  She was given Tessalon for cough.  She is to rest and drink plenty of fluid.  She was provided work excuse note with current CDC return to work guidelines based on COVID test result.  Strict  return precautions given.  Work excuse note provided.  Blood pressure is elevated.  Patient did report a headache but reports that this is mild and she believes it is related to illness.  We will restart antihypertensive medications given persistently elevated blood pressure reading.  She was started on amlodipine 5 mg daily.  Discussed that she should follow-up with her primary care.  She is to avoid decongestants, caffeine, sodium, NSAIDs.  If she develops any headache, chest pain, shortness of breath, dizziness that she needs to go  to the emergency room immediately to which she expressed understanding.  Final Clinical Impressions(s) / UC Diagnoses   Final diagnoses:  Upper respiratory tract infection, unspecified type  Mild intermittent asthma with acute exacerbation  Elevated blood pressure reading with diagnosis of hypertension     Discharge Instructions      I believe that you have a virus.  Please monitor your MyChart for results.  We will contact you if you are positive for flu or COVID.  Continue using albuterol inhaler every 4-6 hours as needed.  Take prednisone 40 mg in the morning.  Do not take NSAIDs with this medication including aspirin, ibuprofen/Advil, naproxen/Aleve.  Use Tessalon for cough.  Use over-the-counter medication including Mucinex, Flonase, Tylenol for symptom relief.  Make sure you rest and drink plenty of fluid.  If anything worsens or if your symptoms are improving please return.  Your blood pressure is elevated.  Please start amlodipine 5 mg daily.  Follow-up with your primary care next week as scheduled.  If you develop any chest pain, shortness of breath, headache, vision change, dizziness in setting of high blood pressure you need to go to the emergency room.     ED Prescriptions     Medication Sig Dispense Auth. Provider   predniSONE (DELTASONE) 20 MG tablet Take 2 tablets (40 mg total) by mouth daily for 4 days. 8 tablet Kallyn Demarcus K, PA-C   amLODipine (NORVASC) 5 MG tablet Take 1 tablet (5 mg total) by mouth daily. 30 tablet Markee Matera K, PA-C   benzonatate (TESSALON) 100 MG capsule Take 1 capsule (100 mg total) by mouth every 8 (eight) hours. 21 capsule Rankin Coolman K, PA-C      I have reviewed the PDMP during this encounter.   Terrilee Croak, PA-C 09/13/22 1752

## 2022-09-14 ENCOUNTER — Ambulatory Visit: Payer: Medicaid Other

## 2022-09-21 ENCOUNTER — Other Ambulatory Visit: Payer: Self-pay

## 2022-09-21 ENCOUNTER — Encounter: Payer: Self-pay | Admitting: Physical Therapy

## 2022-09-21 ENCOUNTER — Ambulatory Visit: Payer: Medicaid Other | Attending: Medical | Admitting: Physical Therapy

## 2022-09-21 DIAGNOSIS — M25562 Pain in left knee: Secondary | ICD-10-CM | POA: Diagnosis present

## 2022-09-21 DIAGNOSIS — M6281 Muscle weakness (generalized): Secondary | ICD-10-CM | POA: Diagnosis present

## 2022-09-21 DIAGNOSIS — M25561 Pain in right knee: Secondary | ICD-10-CM | POA: Insufficient documentation

## 2022-09-21 DIAGNOSIS — G8929 Other chronic pain: Secondary | ICD-10-CM | POA: Diagnosis present

## 2022-09-21 NOTE — Therapy (Signed)
OUTPATIENT PHYSICAL THERAPY EVALUATION   Patient Name: Lacey Whitaker MRN: [id number] DOB:1986-05-07, 36 y.o., female Today's Date: 09/21/2022   PT End of Session - 09/21/22 1613     Visit Number 1    Number of Visits 9    Date for PT Re-Evaluation 11/16/22    Authorization Type [employer]    PT Start Time 1615    PT Stop Time 1700    PT Time Calculation (min) 45 min    Activity Tolerance Patient tolerated treatment well    Behavior During Therapy [facility] for tasks assessed/performed             Past Medical History:  Diagnosis Date   Asthma    Cyst, mediastinum    pt. reports having the cyst removed last year and has reoccured   Diabetes mellitus type II, controlled ([facility]) 01/27/2020   Iron deficiency anemia due to chronic blood loss    Morbid obesity ([facility])    s/p gastric bypass surgery in 2009   Pregnancy induced hypertension    2006   Past Surgical History:  Procedure Laterality Date   BONE CYST EXCISION  2010   CESAREAN SECTION     CESAREAN SECTION  09/02/2011   Procedure: CESAREAN SECTION;  Surgeon: Purcell Nails, MD;  Location: [facility];  Service: Gynecology;  Laterality: N/A;   GASTRIC BYPASS  05/2008   GASTRIC BYPASS  2009   WISDOM TOOTH EXTRACTION  09/2010   Patient Active Problem List   Diagnosis Date Noted   Physical deconditioning 09/20/2020   History of COVID-19 09/20/2020   Pneumonia due to COVID-19 virus 08/16/2020   Shortness of breath 08/16/2020   Snoring 08/16/2020   COVID-19 08/01/2020   Acute respiratory failure with hypoxia ([facility])    Diabetes mellitus type II, controlled ([facility]) 01/27/2020   Asthma, chronic    HTN (hypertension)    Iron deficiency anemia due to chronic blood loss    Obesities, morbid ([facility]) 08/10/2011    PCP: Lew Dawes, PA  REFERRING PROVIDER: Lew Dawes, PA  REFERRING DIAG: Osteoarthritis of knee, unspecified  THERAPY DIAG:  Chronic pain of right knee  Chronic pain of left knee  Muscle weakness  (generalized)  Rationale for Evaluation and Treatment: Rehabilitation  ONSET DATE: Ongoing for 2 years   SUBJECTIVE:  SUBJECTIVE STATEMENT: Patient reports for a while her left knee has been giving her a lot of pain, but lately her right knee has been giving her pain. She has difficulty going up and down stairs, and will hear a lot of cracking of her knees. She works at [employer] and had a lot of difficulty standing for extended periods. She reports the knee pain has been going on for over 2 years and it gradually came on, she was told she has arthritis in her knee.   PERTINENT HISTORY: See PMH above  PAIN:  Are you having pain? Yes:  NPRS scale: 6/10 (9-10/10 with work related tasks) Pain location: Knee, right > left Pain description: Sharp, achy, dull Aggravating factors: Standing, walking, stairs Relieving factors: Rest, prop leg up, self massage, ice/heat  PRECAUTIONS: None  WEIGHT BEARING RESTRICTIONS: No  FALLS:  Has patient fallen in last 6 months? Yes. Number of falls 1, fell and hit left shoulder  LIVING ENVIRONMENT: Lives with: lives with their family Lives in: House/apartment Stairs: Yes: Internal: about 14 steps; can reach both Has following equipment at home: None  OCCUPATION: Works at hotel  PLOF: Independent  PATIENT GOALS: Pain relief, improve standing and walking tolerance, stairs   OBJECTIVE:  PATIENT SURVEYS:  LEFS 33/80  COGNITION: Overall cognitive status: Within functional limits for tasks assessed     SENSATION: WFL  MUSCLE LENGTH: Quad limitation  POSTURE:   Bilateral knee valgus and pes planus  PALPATION: Tender lateral aspect of parapatellar region  LOWER EXTREMITY ROM:  Active ROM Right eval Left eval  Knee flexion 110 115  Knee extension 10+ hyper 10+ hyper   (Blank rows = not tested)  LOWER EXTREMITY MMT:  MMT Right eval Left eval  Hip flexion 4 4  Hip extension    Hip abduction 4- 4-  Knee flexion 4 4  Knee  extension 4 4   (Blank rows = not tested)  LOWER EXTREMITY SPECIAL TESTS:  Not assessed  FUNCTIONAL TESTS:  Unable to maintain SLS bilaterally, trendelenburg when attempted  Patient demonstrates squat pattern with bilateral knee valgus, excessive forward trunk lean and decreased depth due to pain  GAIT: Assistive device utilized: None Level of assistance: Complete Independence Comments: Bilateral knee valgus, toe out with excessive pronation, trendelenburg   TODAY'S TREATMENT: [facility] Adult PT Treatment:                                                DATE: 09/21/2022 Therapeutic Exercise: SLR x 10 each Side clamshell with red x 10 each LAQ with red x 10 each  PATIENT EDUCATION:  Education details: Exam findings, POC, HEP Person educated: Patient Education method: Explanation, Demonstration, Tactile cues, Verbal cues, and Handouts Education comprehension: verbalized understanding, returned demonstration, verbal cues required, tactile cues required, and needs further education  HOME EXERCISE PROGRAM: Access Code: [email or web address]    ASSESSMENT: CLINICAL IMPRESSION: Patient is a 36 y.o. female who was seen today for physical therapy evaluation and treatment for chronic bilateral knee pain. Right knee pain currently worse than left, she demonstrates limitation in right knee flexion with excessive bilateral hyperextension, gross strength deficits of the knees and hips, poor single leg stability and squat mechanics, and reported increased pain with strength and functional testing.   OBJECTIVE IMPAIRMENTS: Abnormal gait, decreased activity tolerance, decreased balance, decreased ROM, decreased strength, impaired flexibility, improper body mechanics, obesity, and pain.   ACTIVITY LIMITATIONS: sitting, standing, squatting, stairs, and locomotion level  PARTICIPATION LIMITATIONS: meal prep, cleaning, shopping, community activity, and occupation  PERSONAL FACTORS: Fitness, Past/current  experiences, Profession, and Time since onset of injury/illness/exacerbation are also affecting patient's functional outcome.   REHAB POTENTIAL: Good  CLINICAL DECISION MAKING: Stable/uncomplicated  EVALUATION COMPLEXITY: Low   GOALS: Goals reviewed with patient? Yes  SHORT TERM GOALS: Target date: 10/19/2022   Patient will be I with initial HEP in order to progress with therapy. Baseline: HEP provided at eval Goal status: INITIAL  2.  Patient will report pain level with work related tasks and stairs </= 6/10 in order to reduce functional limitations Baseline: 9-10/10 pain level Goal status: INITIAL  3.  Patient will demonstrate proper squat (sit to stand) technique in order to indicate improved muscular control and reduce knee pain with household tasks and stairs Baseline: bilateral knee valgus, excessive forward trunk lean and decreased depth due to pain Goal status: INITIAL  LONG TERM GOALS: Target date: 11/16/2022   Patient will be I with final HEP to maintain progress from PT. Baseline: HEP provided at eval  Goal status: INITIAL  2.  Patient will report LEFS >/= 50/80 in order to indicate an improvement in her functional ability with activities including walking, stairs, and household tasks Baseline: 33/80 Goal status: INITIAL  3.  Patient will demonstrate bilateral knee strength 5/5 MMT to improve standing and walking tolerance as well as stair negotiation Baseline: grossly 4/5 MMT Goal status: INITIAL  4.  Patient will report pain level with work related tasks and stairs </= 3/10 in order to reduce functional limitations Baseline: 9-10/10 pain level Goal status: INITIAL  5. Patient will be able to maintain SLS >/= 15 sec bilaterally to indicate improve muscular control and single leg stability to reduce pain and improve stair negotiation. Baseline: unable to maintain SLS bilaterally Goal status: INITIAL   PLAN: PT FREQUENCY: 1-2x/week  PT DURATION: 8  weeks  PLANNED INTERVENTIONS: Therapeutic exercises, Therapeutic activity, Neuromuscular re-education, Balance training, Gait training, Patient/Family education, Self Care, Joint mobilization, Joint manipulation, Aquatic Therapy, Dry Needling, Cryotherapy, Moist heat, Taping, Ionotophoresis 4mg /ml Dexamethasone, Manual therapy, and Re-evaluation  PLAN FOR NEXT SESSION: Review HEP and progress PRN, quad stretching as tolerated, progress knee and hip strengthening as able, progress to weight bearing exercises as tolerated, squat and step-up mechanics   Hilda Blades, PT, DPT, LAT, ATC 09/22/22  8:29 AM Phone: [phone] Fax: [phone]

## 2022-09-21 NOTE — Patient Instructions (Signed)
Access Code: [email or web address] URL: [email or web address] Date: 09/21/2022 Prepared by: Hilda Blades  Exercises - Active Straight Leg Raise with Quad Set  - 1 x daily - 3 sets - 10 reps - Clam with Resistance  - 1 x daily - 3 sets - 10 reps - Seated Knee Extension with Resistance  - 1 x daily - 3 sets - 10 reps

## 2022-09-28 ENCOUNTER — Ambulatory Visit: Payer: Medicaid Other

## 2022-09-28 DIAGNOSIS — G8929 Other chronic pain: Secondary | ICD-10-CM

## 2022-09-28 DIAGNOSIS — M6281 Muscle weakness (generalized): Secondary | ICD-10-CM

## 2022-09-28 DIAGNOSIS — M25561 Pain in right knee: Secondary | ICD-10-CM | POA: Diagnosis not present

## 2022-09-28 NOTE — Therapy (Signed)
OUTPATIENT PHYSICAL THERAPY TREATMENT NOTE   Patient Name: Lacey Whitaker MRN: [id number] DOB:04-19-1986, 36 y.o., female Today's Date: 09/28/2022  PCP: Lew Dawes, PA  REFERRING PROVIDER: Lew Dawes, PA   END OF SESSION:   PT End of Session - 09/28/22 1701     Visit Number 2    Number of Visits 9    Date for PT Re-Evaluation 11/16/22    Authorization Type [employer]    Authorization Time Period 8 visits approved 09/27/22-11/25/21    Authorization - Visit Number 1    Authorization - Number of Visits 8    PT Start Time 1700    PT Stop Time 1740    PT Time Calculation (min) 40 min    Activity Tolerance Patient tolerated treatment well    Behavior During Therapy [facility] for tasks assessed/performed             Past Medical History:  Diagnosis Date   Asthma    Cyst, mediastinum    pt. reports having the cyst removed last year and has reoccured   Diabetes mellitus type II, controlled (HCC) 01/27/2020   Iron deficiency anemia due to chronic blood loss    Morbid obesity (HCC)    s/p gastric bypass surgery in 2009   Pregnancy induced hypertension    2006   Past Surgical History:  Procedure Laterality Date   BONE CYST EXCISION  2010   CESAREAN SECTION     CESAREAN SECTION  09/02/2011   Procedure: CESAREAN SECTION;  Surgeon: Purcell Nails, MD;  Location: [facility];  Service: Gynecology;  Laterality: N/A;   GASTRIC BYPASS  05/2008   GASTRIC BYPASS  2009   WISDOM TOOTH EXTRACTION  09/2010   Patient Active Problem List   Diagnosis Date Noted   Physical deconditioning 09/20/2020   History of COVID-19 09/20/2020   Pneumonia due to COVID-19 virus 08/16/2020   Shortness of breath 08/16/2020   Snoring 08/16/2020   COVID-19 08/01/2020   Acute respiratory failure with hypoxia (HCC)    Diabetes mellitus type II, controlled (HCC) 01/27/2020   Asthma, chronic    HTN (hypertension)    Iron deficiency anemia due to chronic blood loss    Obesities, morbid (HCC)  08/10/2011    REFERRING DIAG: Osteoarthritis of knee, unspecified   THERAPY DIAG:  Chronic pain of right knee  Chronic pain of left knee  Muscle weakness (generalized)  Rationale for Evaluation and Treatment Rehabilitation  PERTINENT HISTORY: See PMH above   PRECAUTIONS: None  SUBJECTIVE:  SUBJECTIVE STATEMENT:  Patient reports that her MD sent a new referral for her upper neck pain. She states her L knee is a bit achy today.    PAIN:  Are you having pain? Yes:  NPRS scale: 6/10 (9-10/10 with work related tasks) Pain location: Knee, right > left Pain description: Sharp, achy, dull Aggravating factors: Standing, walking, stairs Relieving factors: Rest, prop leg up, self massage, ice/heat   OBJECTIVE: (objective measures completed at initial evaluation unless otherwise dated)   PATIENT SURVEYS:  LEFS 33/80   COGNITION: Overall cognitive status: Within functional limits for tasks assessed                         SENSATION: WFL   MUSCLE LENGTH: Quad limitation   POSTURE:             Bilateral knee valgus and pes planus   PALPATION: Tender lateral aspect of parapatellar region   LOWER EXTREMITY ROM:   Active ROM Right eval Left eval  Knee flexion 110 115  Knee extension 10+ hyper 10+ hyper   (Blank rows = not tested)   LOWER EXTREMITY MMT:   MMT Right eval Left eval  Hip flexion 4 4  Hip extension      Hip abduction 4- 4-  Knee flexion 4 4  Knee extension 4 4   (Blank rows = not tested)   LOWER EXTREMITY SPECIAL TESTS:  Not assessed   FUNCTIONAL TESTS:  Unable to maintain SLS bilaterally, trendelenburg when attempted   Patient demonstrates squat pattern with bilateral knee valgus, excessive forward trunk lean and decreased depth due to pain   GAIT: Assistive  device utilized: None Level of assistance: Complete Independence Comments: Bilateral knee valgus, toe out with excessive pronation, trendelenburg     TODAY'S TREATMENT: [facility] Adult PT Treatment:                                                DATE: 09/28/2022 Therapeutic Exercise: Sidestepping at counter RTB at ankles x2 laps  Standing hip extension RTB ankles 2x10 BIL LAQ 3# 2x10 BIL Seated hamstring curl GTB x10 BIL Bridges x10 Supine hip adduction ball squeeze 5" hold x10 Neuromuscular re-ed: FT stance x30" EO/EC FT on Airex x30" EO Semi tandem stance x30" BIL Semi tandem stance on Airex x30" BIL SLS x30" BIL (occasional UE support to maintain balance)   [facility] Adult PT Treatment:                                                DATE: 09/21/2022 Therapeutic Exercise: SLR x 10 each Side clamshell with red x 10 each LAQ with red x 10 each   PATIENT EDUCATION:  Education details: Exam findings, POC, HEP Person educated: Patient Education method: Explanation, Demonstration, Tactile cues, Verbal cues, and Handouts Education comprehension: verbalized understanding, returned demonstration, verbal cues required, tactile cues required, and needs further education   HOME EXERCISE PROGRAM: Access Code: [email or web address]      ASSESSMENT: CLINICAL IMPRESSION: Patient presents to PT with pain in BIL knees and states she is also having pain in her neck and upper arm that her MD has sent a new referral for, but has not  been received by the clinic yet. Session today focused on BIL proximal hip and LE strengthening as well as balance tasks. Patient was able to tolerate all prescribed exercises with no adverse effects. Patient continues to benefit from skilled PT services and should be progressed as able to improve functional independence.     OBJECTIVE IMPAIRMENTS: Abnormal gait, decreased activity tolerance, decreased balance, decreased ROM, decreased strength, impaired flexibility, improper body  mechanics, obesity, and pain.    ACTIVITY LIMITATIONS: sitting, standing, squatting, stairs, and locomotion level   PARTICIPATION LIMITATIONS: meal prep, cleaning, shopping, community activity, and occupation   PERSONAL FACTORS: Fitness, Past/current experiences, Profession, and Time since onset of injury/illness/exacerbation are also affecting patient's functional outcome.    REHAB POTENTIAL: Good   CLINICAL DECISION MAKING: Stable/uncomplicated   EVALUATION COMPLEXITY: Low     GOALS: Goals reviewed with patient? Yes   SHORT TERM GOALS: Target date: 10/19/2022    Patient will be I with initial HEP in order to progress with therapy. Baseline: HEP provided at eval Goal status: INITIAL   2.  Patient will report pain level with work related tasks and stairs </= 6/10 in order to reduce functional limitations Baseline: 9-10/10 pain level Goal status: INITIAL   3.  Patient will demonstrate proper squat (sit to stand) technique in order to indicate improved muscular control and reduce knee pain with household tasks and stairs Baseline: bilateral knee valgus, excessive forward trunk lean and decreased depth due to pain Goal status: INITIAL   LONG TERM GOALS: Target date: 11/16/2022    Patient will be I with final HEP to maintain progress from PT. Baseline: HEP provided at eval Goal status: INITIAL   2.  Patient will report LEFS >/= 50/80 in order to indicate an improvement in her functional ability with activities including walking, stairs, and household tasks Baseline: 33/80 Goal status: INITIAL   3.  Patient will demonstrate bilateral knee strength 5/5 MMT to improve standing and walking tolerance as well as stair negotiation Baseline: grossly 4/5 MMT Goal status: INITIAL   4.  Patient will report pain level with work related tasks and stairs </= 3/10 in order to reduce functional limitations Baseline: 9-10/10 pain level Goal status: INITIAL   5. Patient will be able to  maintain SLS >/= 15 sec bilaterally to indicate improve muscular control and single leg stability to reduce pain and improve stair negotiation. Baseline: unable to maintain SLS bilaterally Goal status: INITIAL     PLAN: PT FREQUENCY: 1-2x/week   PT DURATION: 8 weeks   PLANNED INTERVENTIONS: Therapeutic exercises, Therapeutic activity, Neuromuscular re-education, Balance training, Gait training, Patient/Family education, Self Care, Joint mobilization, Joint manipulation, Aquatic Therapy, Dry Needling, Cryotherapy, Moist heat, Taping, Ionotophoresis 4mg /ml Dexamethasone, Manual therapy, and Re-evaluation   PLAN FOR NEXT SESSION: Review HEP and progress PRN, quad stretching as tolerated, progress knee and hip strengthening as able, progress to weight bearing exercises as tolerated, squat and step-up mechanics   , PTA 09/28/2022, 5:37 PM

## 2022-10-05 ENCOUNTER — Ambulatory Visit: Payer: Medicaid Other

## 2022-10-09 NOTE — Therapy (Incomplete)
OUTPATIENT PHYSICAL THERAPY TREATMENT NOTE   Patient Name: Lacey Whitaker MRN: [id number] DOB:06-15-1986, 36 y.o., female Today's Date: 10/09/2022  PCP: Lew Dawes, PA  REFERRING PROVIDER: Lew Dawes, PA   END OF SESSION:     Past Medical History:  Diagnosis Date   Asthma    Cyst, mediastinum    pt. reports having the cyst removed last year and has reoccured   Diabetes mellitus type II, controlled (HCC) 01/27/2020   Iron deficiency anemia due to chronic blood loss    Morbid obesity (HCC)    s/p gastric bypass surgery in 2009   Pregnancy induced hypertension    2006   Past Surgical History:  Procedure Laterality Date   BONE CYST EXCISION  2010   CESAREAN SECTION     CESAREAN SECTION  09/02/2011   Procedure: CESAREAN SECTION;  Surgeon: Purcell Nails, MD;  Location: [facility];  Service: Gynecology;  Laterality: N/A;   GASTRIC BYPASS  05/2008   GASTRIC BYPASS  2009   WISDOM TOOTH EXTRACTION  09/2010   Patient Active Problem List   Diagnosis Date Noted   Physical deconditioning 09/20/2020   History of COVID-19 09/20/2020   Pneumonia due to COVID-19 virus 08/16/2020   Shortness of breath 08/16/2020   Snoring 08/16/2020   COVID-19 08/01/2020   Acute respiratory failure with hypoxia (HCC)    Diabetes mellitus type II, controlled (HCC) 01/27/2020   Asthma, chronic    HTN (hypertension)    Iron deficiency anemia due to chronic blood loss    Obesities, morbid (HCC) 08/10/2011    REFERRING DIAG: Osteoarthritis of knee, unspecified   THERAPY DIAG:  No diagnosis found.  Rationale for Evaluation and Treatment Rehabilitation  PERTINENT HISTORY: See PMH above   PRECAUTIONS: None  SUBJECTIVE:                                                                                                                                                                                      SUBJECTIVE STATEMENT:  Patient reports that her MD sent a new referral for her upper  neck pain. She states her L knee is a bit achy today.    PAIN:  Are you having pain? Yes:  NPRS scale: 6/10 (9-10/10 with work related tasks) Pain location: Knee, right > left Pain description: Sharp, achy, dull Aggravating factors: Standing, walking, stairs Relieving factors: Rest, prop leg up, self massage, ice/heat   OBJECTIVE: (objective measures completed at initial evaluation unless otherwise dated) PATIENT SURVEYS:  LEFS 33/80    MUSCLE LENGTH: Quad limitation   POSTURE:             Bilateral knee  valgus and pes planus   PALPATION: Tender lateral aspect of parapatellar region   LOWER EXTREMITY ROM:   Active ROM Right eval Left eval  Knee flexion 110 115  Knee extension 10+ hyper 10+ hyper   (Blank rows = not tested)   LOWER EXTREMITY MMT:   MMT Right eval Left eval  Hip flexion 4 4  Hip extension      Hip abduction 4- 4-  Knee flexion 4 4  Knee extension 4 4   (Blank rows = not tested)   FUNCTIONAL TESTS:  Unable to maintain SLS bilaterally, trendelenburg when attempted   Patient demonstrates squat pattern with bilateral knee valgus, excessive forward trunk lean and decreased depth due to pain   GAIT: Assistive device utilized: None Level of assistance: Complete Independence Comments: Bilateral knee valgus, toe out with excessive pronation, trendelenburg     TODAY'S TREATMENT: [facility] Adult PT Treatment:                                                DATE: 10/11/2022 Therapeutic Exercise: Sidestepping at counter RTB at ankles x2 laps  Standing hip extension RTB ankles 2x10 BIL LAQ 3# 2x10 BIL Seated hamstring curl GTB x10 BIL Bridges x10 Supine hip adduction ball squeeze 5" hold x10 Neuromuscular re-ed: FT stance x30" EO/EC FT on Airex x30" EO Semi tandem stance x30" BIL Semi tandem stance on Airex x30" BIL SLS x30" BIL (occasional UE support to maintain balance)   [facility] Adult PT Treatment:                                                 DATE: 09/28/2022 Therapeutic Exercise: Sidestepping at counter RTB at ankles x2 laps  Standing hip extension RTB ankles 2x10 BIL LAQ 3# 2x10 BIL Seated hamstring curl GTB x10 BIL Bridges x10 Supine hip adduction ball squeeze 5" hold x10 Neuromuscular re-ed: FT stance x30" EO/EC FT on Airex x30" EO Semi tandem stance x30" BIL Semi tandem stance on Airex x30" BIL SLS x30" BIL (occasional UE support to maintain balance)  [facility] Adult PT Treatment:                                                DATE: 09/21/2022 Therapeutic Exercise: SLR x 10 each Side clamshell with red x 10 each LAQ with red x 10 each   PATIENT EDUCATION:  Education details: HEP Person educated: Patient Education method: Programmer, multimedia, Demonstration, Tactile cues, Verbal cues Education comprehension: verbalized understanding, returned demonstration, verbal cues required, tactile cues required, and needs further education   HOME EXERCISE PROGRAM: Access Code: [email or web address]      ASSESSMENT: CLINICAL IMPRESSION: Patient tolerated therapy well with no adverse effects. ***  Patient presents to PT with pain in BIL knees and states she is also having pain in her neck and upper arm that her MD has sent a new referral for, but has not been received by the clinic yet. Session today focused on BIL proximal hip and LE strengthening as well as balance tasks. Patient was able to tolerate all prescribed exercises with no  adverse effects. Patient continues to benefit from skilled PT services and should be progressed as able to improve functional independence.     OBJECTIVE IMPAIRMENTS: Abnormal gait, decreased activity tolerance, decreased balance, decreased ROM, decreased strength, impaired flexibility, improper body mechanics, obesity, and pain.    ACTIVITY LIMITATIONS: sitting, standing, squatting, stairs, and locomotion level   PARTICIPATION LIMITATIONS: meal prep, cleaning, shopping, community activity, and occupation   PERSONAL  FACTORS: Fitness, Past/current experiences, Profession, and Time since onset of injury/illness/exacerbation are also affecting patient's functional outcome.      GOALS: Goals reviewed with patient? Yes   SHORT TERM GOALS: Target date: 10/19/2022    Patient will be I with initial HEP in order to progress with therapy. Baseline: HEP provided at eval Goal status: INITIAL   2.  Patient will report pain level with work related tasks and stairs </= 6/10 in order to reduce functional limitations Baseline: 9-10/10 pain level Goal status: INITIAL   3.  Patient will demonstrate proper squat (sit to stand) technique in order to indicate improved muscular control and reduce knee pain with household tasks and stairs Baseline: bilateral knee valgus, excessive forward trunk lean and decreased depth due to pain Goal status: INITIAL   LONG TERM GOALS: Target date: 11/16/2022    Patient will be I with final HEP to maintain progress from PT. Baseline: HEP provided at eval Goal status: INITIAL   2.  Patient will report LEFS >/= 50/80 in order to indicate an improvement in her functional ability with activities including walking, stairs, and household tasks Baseline: 33/80 Goal status: INITIAL   3.  Patient will demonstrate bilateral knee strength 5/5 MMT to improve standing and walking tolerance as well as stair negotiation Baseline: grossly 4/5 MMT Goal status: INITIAL   4.  Patient will report pain level with work related tasks and stairs </= 3/10 in order to reduce functional limitations Baseline: 9-10/10 pain level Goal status: INITIAL   5. Patient will be able to maintain SLS >/= 15 sec bilaterally to indicate improve muscular control and single leg stability to reduce pain and improve stair negotiation. Baseline: unable to maintain SLS bilaterally Goal status: INITIAL     PLAN: PT FREQUENCY: 1-2x/week   PT DURATION: 8 weeks   PLANNED INTERVENTIONS: Therapeutic exercises, Therapeutic  activity, Neuromuscular re-education, Balance training, Gait training, Patient/Family education, Self Care, Joint mobilization, Joint manipulation, Aquatic Therapy, Dry Needling, Cryotherapy, Moist heat, Taping, Ionotophoresis 4mg /ml Dexamethasone, Manual therapy, and Re-evaluation   PLAN FOR NEXT SESSION: Review HEP and progress PRN, quad stretching as tolerated, progress knee and hip strengthening as able, progress to weight bearing exercises as tolerated, squat and step-up mechanics   , PT, DPT, LAT, ATC 10/09/22  1:38 PM Phone: [phone] Fax: [phone]

## 2022-10-11 ENCOUNTER — Ambulatory Visit: Payer: Medicaid Other | Admitting: Physical Therapy

## 2022-10-18 NOTE — Therapy (Signed)
OUTPATIENT PHYSICAL THERAPY TREATMENT NOTE   Patient Name: Lacey Whitaker MRN: [id number] DOB:1986/06/13, 36 y.o., female Today's Date: 10/19/2022  PCP: Lew Dawes, PA  REFERRING PROVIDER: Lew Dawes, PA   END OF SESSION:   PT End of Session - 10/19/22 1634     Visit Number 3    Number of Visits 9    Date for PT Re-Evaluation 11/16/22    Authorization Type [employer]    Authorization Time Period 09/27/22 - 11/25/21    Authorization - Visit Number 2    Authorization - Number of Visits 8    PT Start Time 1615    PT Stop Time 1700    PT Time Calculation (min) 45 min    Activity Tolerance Patient tolerated treatment well    Behavior During Therapy [facility] for tasks assessed/performed              Past Medical History:  Diagnosis Date   Asthma    Cyst, mediastinum    pt. reports having the cyst removed last year and has reoccured   Diabetes mellitus type II, controlled (HCC) 01/27/2020   Iron deficiency anemia due to chronic blood loss    Morbid obesity (HCC)    s/p gastric bypass surgery in 2009   Pregnancy induced hypertension    2006   Past Surgical History:  Procedure Laterality Date   BONE CYST EXCISION  2010   CESAREAN SECTION     CESAREAN SECTION  09/02/2011   Procedure: CESAREAN SECTION;  Surgeon: Purcell Nails, MD;  Location: [facility];  Service: Gynecology;  Laterality: N/A;   GASTRIC BYPASS  05/2008   GASTRIC BYPASS  2009   WISDOM TOOTH EXTRACTION  09/2010   Patient Active Problem List   Diagnosis Date Noted   Physical deconditioning 09/20/2020   History of COVID-19 09/20/2020   Pneumonia due to COVID-19 virus 08/16/2020   Shortness of breath 08/16/2020   Snoring 08/16/2020   COVID-19 08/01/2020   Acute respiratory failure with hypoxia (HCC)    Diabetes mellitus type II, controlled (HCC) 01/27/2020   Asthma, chronic    HTN (hypertension)    Iron deficiency anemia due to chronic blood loss    Obesities, morbid (HCC) 08/10/2011     REFERRING DIAG: Osteoarthritis of knee, unspecified   THERAPY DIAG:  Chronic pain of right knee  Chronic pain of left knee  Muscle weakness (generalized)  Rationale for Evaluation and Treatment Rehabilitation  PERTINENT HISTORY: See PMH above   PRECAUTIONS: None   SUBJECTIVE:       SUBJECTIVE STATEMENT:  Patient reports that the past couple of days her knees have been giving her some issues. She states that in the evening she likes to sit on the floor and read and she may have tried to move a little too quickly when getting up which injured the right knee.  PAIN:  Are you having pain? Yes:  NPRS scale: 7/10 (9-10/10 with work related tasks) Pain location: Knee, right > left Pain description: Sharp, achy, dull Aggravating factors: Standing, walking, stairs Relieving factors: Rest, prop leg up, self massage, ice/heat   OBJECTIVE: (objective measures completed at initial evaluation unless otherwise dated) PATIENT SURVEYS:  LEFS 33/80    MUSCLE LENGTH: Quad limitation   POSTURE:             Bilateral knee valgus and pes planus   PALPATION: Tender lateral aspect of parapatellar region   LOWER EXTREMITY ROM:   Active ROM  Right eval Left eval  Knee flexion 110 115  Knee extension 10+ hyper 10+ hyper   (Blank rows = not tested)   LOWER EXTREMITY MMT:   MMT Right eval Left eval Rt / Lt 10/19/22  Hip flexion 4 4   Hip extension       Hip abduction 4- 4- 4- / 4-  Knee flexion 4 4   Knee extension 4 4 4  / 4   (Blank rows = not tested)   FUNCTIONAL TESTS:  Unable to maintain SLS bilaterally, trendelenburg when attempted   Patient demonstrates squat pattern with bilateral knee valgus, excessive forward trunk lean and decreased depth due to pain 10/19/2022: bilateral knee valgus   GAIT: Assistive device utilized: None Level of assistance: Complete Independence Comments: Bilateral knee valgus, toe out with excessive pronation, trendelenburg     TODAY'S  TREATMENT: [facility] Adult PT Treatment:                                                DATE: 10/19/2022 Therapeutic Exercise: NuStep L6 x 5 min with LE only while taking subjective Supine quad set with ball under knee 10 x 5 sec SLR x 10 each SAQ with 4# 2 x 10 each Bridge 2 x 10 Sidelying hip abduction 2 x 10 each LAQ with 3# 2 x 10 each   [facility] Adult PT Treatment:                                                DATE: 09/28/2022 Therapeutic Exercise: Sidestepping at counter RTB at ankles x2 laps  Standing hip extension RTB ankles 2x10 BIL LAQ 3# 2x10 BIL Seated hamstring curl GTB x10 BIL Bridges x10 Supine hip adduction ball squeeze 5" hold x10 Neuromuscular re-ed: FT stance x30" EO/EC FT on Airex x30" EO Semi tandem stance x30" BIL Semi tandem stance on Airex x30" BIL SLS x30" BIL (occasional UE support to maintain balance)  [facility] Adult PT Treatment:                                                DATE: 09/21/2022 Therapeutic Exercise: SLR x 10 each Side clamshell with red x 10 each LAQ with red x 10 each   PATIENT EDUCATION:  Education details: HEP Person educated: Patient Education method: Explanation, Demonstration, Tactile cues, Verbal cues Education comprehension: verbalized understanding, returned demonstration, verbal cues required, tactile cues required, and needs further education   HOME EXERCISE PROGRAM: Access Code: [email or web address]      ASSESSMENT: CLINICAL IMPRESSION: Patient tolerated therapy well with no adverse effects. She reports increased knee pain this visit so therapy focused primarily on mat based strengthening for the knees and hips. She did tolerated strengthening well but did report increased pain primarily with right knee. She is going to have a new referral for cervical radicular pain sent and will reassess at next available time. No changes made to HEP but patient was provided a green band to progress resistance. Patient would benefit from continued skilled PT  to progress her mobility and strength in order to reduce pain and  maximize functional ability.     OBJECTIVE IMPAIRMENTS: Abnormal gait, decreased activity tolerance, decreased balance, decreased ROM, decreased strength, impaired flexibility, improper body mechanics, obesity, and pain.    ACTIVITY LIMITATIONS: sitting, standing, squatting, stairs, and locomotion level   PARTICIPATION LIMITATIONS: meal prep, cleaning, shopping, community activity, and occupation   PERSONAL FACTORS: Fitness, Past/current experiences, Profession, and Time since onset of injury/illness/exacerbation are also affecting patient's functional outcome.      GOALS: Goals reviewed with patient? Yes   SHORT TERM GOALS: Target date: 10/19/2022    Patient will be I with initial HEP in order to progress with therapy. Baseline: HEP provided at eval 10/19/2022: progressing Goal status: ONGOING   2.  Patient will report pain level with work related tasks and stairs </= 6/10 in order to reduce functional limitations Baseline: 9-10/10 pain level 10/19/2022: 7/10 Goal status: ONGOING   3.  Patient will demonstrate proper squat (sit to stand) technique in order to indicate improved muscular control and reduce knee pain with household tasks and stairs Baseline: bilateral knee valgus, excessive forward trunk lean and decreased depth due to pain 10/19/2022: bilateral knee valgus Goal status: ONGOING   LONG TERM GOALS: Target date: 11/16/2022    Patient will be I with final HEP to maintain progress from PT. Baseline: HEP provided at eval Goal status: INITIAL   2.  Patient will report LEFS >/= 50/80 in order to indicate an improvement in her functional ability with activities including walking, stairs, and household tasks Baseline: 33/80 Goal status: INITIAL   3.  Patient will demonstrate bilateral knee strength 5/5 MMT to improve standing and walking tolerance as well as stair negotiation Baseline: grossly 4/5  MMT Goal status: INITIAL   4.  Patient will report pain level with work related tasks and stairs </= 3/10 in order to reduce functional limitations Baseline: 9-10/10 pain level Goal status: INITIAL   5. Patient will be able to maintain SLS >/= 15 sec bilaterally to indicate improve muscular control and single leg stability to reduce pain and improve stair negotiation. Baseline: unable to maintain SLS bilaterally Goal status: INITIAL     PLAN: PT FREQUENCY: 1-2x/week   PT DURATION: 8 weeks   PLANNED INTERVENTIONS: Therapeutic exercises, Therapeutic activity, Neuromuscular re-education, Balance training, Gait training, Patient/Family education, Self Care, Joint mobilization, Joint manipulation, Aquatic Therapy, Dry Needling, Cryotherapy, Moist heat, Taping, Ionotophoresis 4mg /ml Dexamethasone, Manual therapy, and Re-evaluation   PLAN FOR NEXT SESSION: Review HEP and progress PRN, quad stretching as tolerated, progress knee and hip strengthening as able, progress to weight bearing exercises as tolerated, squat and step-up mechanics   , PT, DPT, LAT, ATC 10/19/22  5:10 PM Phone: [phone] Fax: [phone]

## 2022-10-19 ENCOUNTER — Other Ambulatory Visit: Payer: Self-pay

## 2022-10-19 ENCOUNTER — Encounter: Payer: Self-pay | Admitting: Physical Therapy

## 2022-10-19 ENCOUNTER — Ambulatory Visit: Payer: Medicaid Other | Admitting: Physical Therapy

## 2022-10-19 DIAGNOSIS — G8929 Other chronic pain: Secondary | ICD-10-CM

## 2022-10-19 DIAGNOSIS — M25561 Pain in right knee: Secondary | ICD-10-CM | POA: Diagnosis not present

## 2022-10-19 DIAGNOSIS — M6281 Muscle weakness (generalized): Secondary | ICD-10-CM

## 2022-10-26 ENCOUNTER — Ambulatory Visit: Payer: Medicaid Other | Attending: Medical

## 2022-10-26 DIAGNOSIS — M25562 Pain in left knee: Secondary | ICD-10-CM | POA: Insufficient documentation

## 2022-10-26 DIAGNOSIS — M6281 Muscle weakness (generalized): Secondary | ICD-10-CM | POA: Diagnosis present

## 2022-10-26 DIAGNOSIS — G8929 Other chronic pain: Secondary | ICD-10-CM | POA: Insufficient documentation

## 2022-10-26 DIAGNOSIS — M25561 Pain in right knee: Secondary | ICD-10-CM | POA: Diagnosis present

## 2022-10-26 NOTE — Therapy (Signed)
OUTPATIENT PHYSICAL THERAPY TREATMENT NOTE   Patient Name: Lacey Whitaker MRN: [id number] DOB:[birth year], 36 y.o., female Today's Date: 10/26/2022  PCP: Lew Dawes, PA  REFERRING PROVIDER: Lew Dawes, PA   END OF SESSION:   PT End of Session - 10/26/22 1751     Visit Number 4    Number of Visits 9    Date for PT Re-Evaluation 11/16/22    Authorization Type [employer]    Authorization Time Period 09/27/22 - 11/25/21    Authorization - Visit Number 3    Authorization - Number of Visits 8    PT Start Time 1750    PT Stop Time 1831    PT Time Calculation (min) 41 min    Activity Tolerance Patient tolerated treatment well    Behavior During Therapy [facility] for tasks assessed/performed               Past Medical History:  Diagnosis Date   Asthma    Cyst, mediastinum    pt. reports having the cyst removed last year and has reoccured   Diabetes mellitus type II, controlled (HCC) 01/27/2020   Iron deficiency anemia due to chronic blood loss    Morbid obesity (HCC)    s/p gastric bypass surgery in 2009   Pregnancy induced hypertension    2006   Past Surgical History:  Procedure Laterality Date   BONE CYST EXCISION  2010   CESAREAN SECTION     CESAREAN SECTION  09/02/2011   Procedure: CESAREAN SECTION;  Surgeon: Purcell Nails, MD;  Location: [facility];  Service: Gynecology;  Laterality: N/A;   GASTRIC BYPASS  05/2008   GASTRIC BYPASS  2009   WISDOM TOOTH EXTRACTION  09/2010   Patient Active Problem List   Diagnosis Date Noted   Physical deconditioning 09/20/2020   History of COVID-19 09/20/2020   Pneumonia due to COVID-19 virus 08/16/2020   Shortness of breath 08/16/2020   Snoring 08/16/2020   COVID-19 08/01/2020   Acute respiratory failure with hypoxia (HCC)    Diabetes mellitus type II, controlled (HCC) 01/27/2020   Asthma, chronic    HTN (hypertension)    Iron deficiency anemia due to chronic blood loss    Obesities, morbid (HCC) 08/10/2011     REFERRING DIAG: Osteoarthritis of knee, unspecified   THERAPY DIAG:  Chronic pain of right knee  Chronic pain of left knee  Muscle weakness (generalized)  Rationale for Evaluation and Treatment Rehabilitation  PERTINENT HISTORY: See PMH above   PRECAUTIONS: None   SUBJECTIVE:       SUBJECTIVE STATEMENT:  Patient reports increased pain in BIL knees and that she feels the stool she is sitting on is causing some pain as she can't have her feet flat on the ground.   PAIN:  Are you having pain? Yes:  NPRS scale: 5-7/10 (9-10/10 with work related tasks) Pain location: Knee, right > left Pain description: Sharp, achy, dull Aggravating factors: Standing, walking, stairs Relieving factors: Rest, prop leg up, self massage, ice/heat   OBJECTIVE: (objective measures completed at initial evaluation unless otherwise dated) PATIENT SURVEYS:  LEFS 33/80    MUSCLE LENGTH: Quad limitation   POSTURE:             Bilateral knee valgus and pes planus   PALPATION: Tender lateral aspect of parapatellar region   LOWER EXTREMITY ROM:   Active ROM Right eval Left eval  Knee flexion 110 115  Knee extension 10+ hyper 10+ hyper   (  Blank rows = not tested)   LOWER EXTREMITY MMT:   MMT Right eval Left eval Rt / Lt 10/19/22  Hip flexion 4 4   Hip extension       Hip abduction 4- 4- 4- / 4-  Knee flexion 4 4   Knee extension 4 4 4  / 4   (Blank rows = not tested)   FUNCTIONAL TESTS:  Unable to maintain SLS bilaterally, trendelenburg when attempted   Patient demonstrates squat pattern with bilateral knee valgus, excessive forward trunk lean and decreased depth due to pain 10/19/2022: bilateral knee valgus   GAIT: Assistive device utilized: None Level of assistance: Complete Independence Comments: Bilateral knee valgus, toe out with excessive pronation, trendelenburg     TODAY'S TREATMENT:  [facility] Adult PT Treatment:                                                DATE:  10/26/2022 Therapeutic Exercise: NuStep L6 x 5 min with LE only while taking subjective Seated heel/toe raises 2x10 SLR x 10 each Bridge 2 x 10 Sidelying hip abduction x 10 each LAQ with 3# 2 x 10 each Seated hamstring curl GTB x10 BIL Supine hip adduction ball squeeze 5" hold 2x10   [facility] Adult PT Treatment:                                                DATE: 10/19/2022 Therapeutic Exercise: NuStep L6 x 5 min with LE only while taking subjective Supine quad set with ball under knee 10 x 5 sec SLR x 10 each SAQ with 4# 2 x 10 each Bridge 2 x 10 Sidelying hip abduction 2 x 10 each LAQ with 3# 2 x 10 each   [facility] Adult PT Treatment:                                                DATE: 09/28/2022 Therapeutic Exercise: Sidestepping at counter RTB at ankles x2 laps  Standing hip extension RTB ankles 2x10 BIL LAQ 3# 2x10 BIL Seated hamstring curl GTB x10 BIL Bridges x10 Supine hip adduction ball squeeze 5" hold x10 Neuromuscular re-ed: FT stance x30" EO/EC FT on Airex x30" EO Semi tandem stance x30" BIL Semi tandem stance on Airex x30" BIL SLS x30" BIL (occasional UE support to maintain balance)    PATIENT EDUCATION:  Education details: HEP Person educated: Patient Education method: 13/07/2022, Demonstration, Tactile cues, Verbal cues Education comprehension: verbalized understanding, returned demonstration, verbal cues required, tactile cues required, and needs further education   HOME EXERCISE PROGRAM: Access Code: [email or web address]      ASSESSMENT: CLINICAL IMPRESSION: Patient presents to PT with pain in BIL knees, R>L, and reports HEP compliance. Session today continued to focus on BIL LE strengthening with mat based exercises due to high levels of pain. She is somewhat limited by pain throughout session, but is able to complete all prescribed exercises. Patient continues to benefit from skilled PT services and should be progressed as able to improve functional independence.      OBJECTIVE IMPAIRMENTS: Abnormal gait, decreased activity tolerance,  decreased balance, decreased ROM, decreased strength, impaired flexibility, improper body mechanics, obesity, and pain.    ACTIVITY LIMITATIONS: sitting, standing, squatting, stairs, and locomotion level   PARTICIPATION LIMITATIONS: meal prep, cleaning, shopping, community activity, and occupation   PERSONAL FACTORS: Fitness, Past/current experiences, Profession, and Time since onset of injury/illness/exacerbation are also affecting patient's functional outcome.      GOALS: Goals reviewed with patient? Yes   SHORT TERM GOALS: Target date: 10/19/2022    Patient will be I with initial HEP in order to progress with therapy. Baseline: HEP provided at eval 10/19/2022: progressing Goal status: ONGOING   2.  Patient will report pain level with work related tasks and stairs </= 6/10 in order to reduce functional limitations Baseline: 9-10/10 pain level 10/19/2022: 7/10 Goal status: ONGOING   3.  Patient will demonstrate proper squat (sit to stand) technique in order to indicate improved muscular control and reduce knee pain with household tasks and stairs Baseline: bilateral knee valgus, excessive forward trunk lean and decreased depth due to pain 10/19/2022: bilateral knee valgus Goal status: ONGOING   LONG TERM GOALS: Target date: 11/16/2022    Patient will be I with final HEP to maintain progress from PT. Baseline: HEP provided at eval Goal status: INITIAL   2.  Patient will report LEFS >/= 50/80 in order to indicate an improvement in her functional ability with activities including walking, stairs, and household tasks Baseline: 33/80 Goal status: INITIAL   3.  Patient will demonstrate bilateral knee strength 5/5 MMT to improve standing and walking tolerance as well as stair negotiation Baseline: grossly 4/5 MMT Goal status: INITIAL   4.  Patient will report pain level with work related tasks and stairs  </= 3/10 in order to reduce functional limitations Baseline: 9-10/10 pain level Goal status: INITIAL   5. Patient will be able to maintain SLS >/= 15 sec bilaterally to indicate improve muscular control and single leg stability to reduce pain and improve stair negotiation. Baseline: unable to maintain SLS bilaterally Goal status: INITIAL     PLAN: PT FREQUENCY: 1-2x/week   PT DURATION: 8 weeks   PLANNED INTERVENTIONS: Therapeutic exercises, Therapeutic activity, Neuromuscular re-education, Balance training, Gait training, Patient/Family education, Self Care, Joint mobilization, Joint manipulation, Aquatic Therapy, Dry Needling, Cryotherapy, Moist heat, Taping, Ionotophoresis 4mg /ml Dexamethasone, Manual therapy, and Re-evaluation   PLAN FOR NEXT SESSION: Review HEP and progress PRN, quad stretching as tolerated, progress knee and hip strengthening as able, progress to weight bearing exercises as tolerated, squat and step-up mechanics   , PTA 10/26/22 6:30 PM

## 2022-10-30 ENCOUNTER — Ambulatory Visit: Payer: Medicaid Other | Admitting: Physical Therapy

## 2022-10-30 NOTE — Therapy (Incomplete)
OUTPATIENT PHYSICAL THERAPY TREATMENT NOTE   Patient Name: Lacey Whitaker MRN: [id number] DOB:[birth year], 36 y.o., female Today's Date: 10/30/2022  PCP: Lew Dawes, PA  REFERRING PROVIDER: Lew Dawes, PA   END OF SESSION:       Past Medical History:  Diagnosis Date   Asthma    Cyst, mediastinum    pt. reports having the cyst removed last year and has reoccured   Diabetes mellitus type II, controlled (HCC) 01/27/2020   Iron deficiency anemia due to chronic blood loss    Morbid obesity (HCC)    s/p gastric bypass surgery in 2009   Pregnancy induced hypertension    2006   Past Surgical History:  Procedure Laterality Date   BONE CYST EXCISION  2010   CESAREAN SECTION     CESAREAN SECTION  09/02/2011   Procedure: CESAREAN SECTION;  Surgeon: Purcell Nails, MD;  Location: [facility];  Service: Gynecology;  Laterality: N/A;   GASTRIC BYPASS  05/2008   GASTRIC BYPASS  2009   WISDOM TOOTH EXTRACTION  09/2010   Patient Active Problem List   Diagnosis Date Noted   Physical deconditioning 09/20/2020   History of COVID-19 09/20/2020   Pneumonia due to COVID-19 virus 08/16/2020   Shortness of breath 08/16/2020   Snoring 08/16/2020   COVID-19 08/01/2020   Acute respiratory failure with hypoxia (HCC)    Diabetes mellitus type II, controlled (HCC) 01/27/2020   Asthma, chronic    HTN (hypertension)    Iron deficiency anemia due to chronic blood loss    Obesities, morbid (HCC) 08/10/2011    REFERRING DIAG: Osteoarthritis of knee, unspecified   THERAPY DIAG:  No diagnosis found.  Rationale for Evaluation and Treatment Rehabilitation  PERTINENT HISTORY: See PMH above   PRECAUTIONS: None   SUBJECTIVE:       SUBJECTIVE STATEMENT:  Patient reports increased pain in BIL knees and that she feels the stool she is sitting on is causing some pain as she can't have her feet flat on the ground.   PAIN:  Are you having pain? Yes:  NPRS scale: 5-7/10 (9-10/10 with  work related tasks) Pain location: Knee, right > left Pain description: Sharp, achy, dull Aggravating factors: Standing, walking, stairs Relieving factors: Rest, prop leg up, self massage, ice/heat   OBJECTIVE: (objective measures completed at initial evaluation unless otherwise dated) PATIENT SURVEYS:  LEFS 33/80    MUSCLE LENGTH: Quad limitation   POSTURE:             Bilateral knee valgus and pes planus   PALPATION: Tender lateral aspect of parapatellar region   LOWER EXTREMITY ROM:   Active ROM Right eval Left eval  Knee flexion 110 115  Knee extension 10+ hyper 10+ hyper   (Blank rows = not tested)   LOWER EXTREMITY MMT:   MMT Right eval Left eval Rt / Lt 10/19/22  Hip flexion 4 4   Hip extension       Hip abduction 4- 4- 4- / 4-  Knee flexion 4 4   Knee extension 4 4 4  / 4   (Blank rows = not tested)   FUNCTIONAL TESTS:  Unable to maintain SLS bilaterally, trendelenburg when attempted   Patient demonstrates squat pattern with bilateral knee valgus, excessive forward trunk lean and decreased depth due to pain 10/19/2022: bilateral knee valgus   GAIT: Assistive device utilized: None Level of assistance: Complete Independence Comments: Bilateral knee valgus, toe out with excessive pronation, trendelenburg  TODAY'S TREATMENT: [facility] Adult PT Treatment:                                                DATE: 10/30/2022 Therapeutic Exercise: NuStep L6 x 5 min with LE only while taking subjective Seated heel/toe raises 2x10 SLR x 10 each Bridge 2 x 10 Sidelying hip abduction x 10 each LAQ with 3# 2 x 10 each Seated hamstring curl GTB x10 BIL Supine hip adduction ball squeeze 5" hold 2x10   [facility] Adult PT Treatment:                                                DATE: 10/19/2022 Therapeutic Exercise: NuStep L6 x 5 min with LE only while taking subjective Supine quad set with ball under knee 10 x 5 sec SLR x 10 each SAQ with 4# 2 x 10 each Bridge 2 x  10 Sidelying hip abduction 2 x 10 each LAQ with 3# 2 x 10 each  [facility] Adult PT Treatment:                                                DATE: 09/28/2022 Therapeutic Exercise: Sidestepping at counter RTB at ankles x2 laps  Standing hip extension RTB ankles 2x10 BIL LAQ 3# 2x10 BIL Seated hamstring curl GTB x10 BIL Bridges x10 Supine hip adduction ball squeeze 5" hold x10 Neuromuscular re-ed: FT stance x30" EO/EC FT on Airex x30" EO Semi tandem stance x30" BIL Semi tandem stance on Airex x30" BIL SLS x30" BIL (occasional UE support to maintain balance)  PATIENT EDUCATION:  Education details: HEP Person educated: Patient Education method: Programmer, multimedia, Demonstration, Tactile cues, Verbal cues Education comprehension: verbalized understanding, returned demonstration, verbal cues required, tactile cues required, and needs further education   HOME EXERCISE PROGRAM: Access Code: [email or web address]      ASSESSMENT: CLINICAL IMPRESSION: Patient tolerated therapy well with no adverse effects. ***  Patient presents to PT with pain in BIL knees, R>L, and reports HEP compliance. Session today continued to focus on BIL LE strengthening with mat based exercises due to high levels of pain. She is somewhat limited by pain throughout session, but is able to complete all prescribed exercises. Patient continues to benefit from skilled PT services and should be progressed as able to improve functional independence.     OBJECTIVE IMPAIRMENTS: Abnormal gait, decreased activity tolerance, decreased balance, decreased ROM, decreased strength, impaired flexibility, improper body mechanics, obesity, and pain.    ACTIVITY LIMITATIONS: sitting, standing, squatting, stairs, and locomotion level   PARTICIPATION LIMITATIONS: meal prep, cleaning, shopping, community activity, and occupation   PERSONAL FACTORS: Fitness, Past/current experiences, Profession, and Time since onset of injury/illness/exacerbation are also  affecting patient's functional outcome.      GOALS: Goals reviewed with patient? Yes   SHORT TERM GOALS: Target date: 10/19/2022    Patient will be I with initial HEP in order to progress with therapy. Baseline: HEP provided at eval 10/19/2022: progressing Goal status: ONGOING   2.  Patient will report pain level with work related tasks and stairs </= 6/10 in  order to reduce functional limitations Baseline: 9-10/10 pain level 10/19/2022: 7/10 Goal status: ONGOING   3.  Patient will demonstrate proper squat (sit to stand) technique in order to indicate improved muscular control and reduce knee pain with household tasks and stairs Baseline: bilateral knee valgus, excessive forward trunk lean and decreased depth due to pain 10/19/2022: bilateral knee valgus Goal status: ONGOING   LONG TERM GOALS: Target date: 11/16/2022    Patient will be I with final HEP to maintain progress from PT. Baseline: HEP provided at eval Goal status: INITIAL   2.  Patient will report LEFS >/= 50/80 in order to indicate an improvement in her functional ability with activities including walking, stairs, and household tasks Baseline: 33/80 Goal status: INITIAL   3.  Patient will demonstrate bilateral knee strength 5/5 MMT to improve standing and walking tolerance as well as stair negotiation Baseline: grossly 4/5 MMT Goal status: INITIAL   4.  Patient will report pain level with work related tasks and stairs </= 3/10 in order to reduce functional limitations Baseline: 9-10/10 pain level Goal status: INITIAL   5. Patient will be able to maintain SLS >/= 15 sec bilaterally to indicate improve muscular control and single leg stability to reduce pain and improve stair negotiation. Baseline: unable to maintain SLS bilaterally Goal status: INITIAL     PLAN: PT FREQUENCY: 1-2x/week   PT DURATION: 8 weeks   PLANNED INTERVENTIONS: Therapeutic exercises, Therapeutic activity, Neuromuscular re-education,  Balance training, Gait training, Patient/Family education, Self Care, Joint mobilization, Joint manipulation, Aquatic Therapy, Dry Needling, Cryotherapy, Moist heat, Taping, Ionotophoresis 4mg /ml Dexamethasone, Manual therapy, and Re-evaluation   PLAN FOR NEXT SESSION: Review HEP and progress PRN, quad stretching as tolerated, progress knee and hip strengthening as able, progress to weight bearing exercises as tolerated, squat and step-up mechanics   , PT, DPT, LAT, ATC 10/30/22  11:07 AM Phone: [phone] Fax: [phone]

## 2022-11-08 NOTE — Therapy (Incomplete)
OUTPATIENT PHYSICAL THERAPY TREATMENT NOTE   Patient Name: Lacey Whitaker MRN: [id number] DOB:October 18, 1986, 36 y.o., female Today's Date: 11/08/2022  PCP: Lew Dawes, PA  REFERRING PROVIDER: Lew Dawes, PA   END OF SESSION:       Past Medical History:  Diagnosis Date   Asthma    Cyst, mediastinum    pt. reports having the cyst removed last year and has reoccured   Diabetes mellitus type II, controlled (HCC) 01/27/2020   Iron deficiency anemia due to chronic blood loss    Morbid obesity (HCC)    s/p gastric bypass surgery in 2009   Pregnancy induced hypertension    2006   Past Surgical History:  Procedure Laterality Date   BONE CYST EXCISION  2010   CESAREAN SECTION     CESAREAN SECTION  09/02/2011   Procedure: CESAREAN SECTION;  Surgeon: Purcell Nails, MD;  Location: [facility];  Service: Gynecology;  Laterality: N/A;   GASTRIC BYPASS  05/2008   GASTRIC BYPASS  2009   WISDOM TOOTH EXTRACTION  09/2010   Patient Active Problem List   Diagnosis Date Noted   Physical deconditioning 09/20/2020   History of COVID-19 09/20/2020   Pneumonia due to COVID-19 virus 08/16/2020   Shortness of breath 08/16/2020   Snoring 08/16/2020   COVID-19 08/01/2020   Acute respiratory failure with hypoxia (HCC)    Diabetes mellitus type II, controlled (HCC) 01/27/2020   Asthma, chronic    HTN (hypertension)    Iron deficiency anemia due to chronic blood loss    Obesities, morbid (HCC) 08/10/2011    REFERRING DIAG: Osteoarthritis of knee, unspecified   THERAPY DIAG:  No diagnosis found.  Rationale for Evaluation and Treatment Rehabilitation  PERTINENT HISTORY: See PMH above   PRECAUTIONS: None   SUBJECTIVE:       SUBJECTIVE STATEMENT:  *** Patient reports increased pain in BIL knees and that she feels the stool she is sitting on is causing some pain as she can't have her feet flat on the ground.   PAIN:  Are you having pain? Yes:  NPRS scale: 5-7/10 (9-10/10 with  work related tasks) Pain location: Knee, right > left Pain description: Sharp, achy, dull Aggravating factors: Standing, walking, stairs Relieving factors: Rest, prop leg up, self massage, ice/heat   OBJECTIVE: (objective measures completed at initial evaluation unless otherwise dated) PATIENT SURVEYS:  LEFS 33/80    MUSCLE LENGTH: Quad limitation   POSTURE:             Bilateral knee valgus and pes planus   PALPATION: Tender lateral aspect of parapatellar region   LOWER EXTREMITY ROM:   Active ROM Right eval Left eval  Knee flexion 110 115  Knee extension 10+ hyper 10+ hyper   (Blank rows = not tested)   LOWER EXTREMITY MMT:   MMT Right eval Left eval Rt / Lt 10/19/22  Hip flexion 4 4   Hip extension       Hip abduction 4- 4- 4- / 4-  Knee flexion 4 4   Knee extension 4 4 4  / 4   (Blank rows = not tested)   FUNCTIONAL TESTS:  Unable to maintain SLS bilaterally, trendelenburg when attempted   Patient demonstrates squat pattern with bilateral knee valgus, excessive forward trunk lean and decreased depth due to pain 10/19/2022: bilateral knee valgus   GAIT: Assistive device utilized: None Level of assistance: Complete Independence Comments: Bilateral knee valgus, toe out with excessive pronation, trendelenburg  TODAY'S TREATMENT: [facility] Adult PT Treatment:                                                DATE: 11/09/22 Therapeutic Exercise: NuStep L6 x 5 min with LE only while taking subjective Seated heel/toe raises 2x10 SLR x 10 each Bridge 2 x 10 Sidelying hip abduction x 10 each LAQ with 3# 2 x 10 each Seated hamstring curl GTB x10 BIL Supine hip adduction ball squeeze 5" hold 2x10 Neuromuscular re-ed: FT stance x30" EO/EC FT on Airex x30" EO Semi tandem stance x30" BIL Semi tandem stance on Airex x30" BIL SLS x30" BIL (occasional UE support to maintain balance)    [facility] Adult PT Treatment:                                                DATE:  10/26/2022 Therapeutic Exercise: NuStep L6 x 5 min with LE only while taking subjective Seated heel/toe raises 2x10 SLR x 10 each Bridge 2 x 10 Sidelying hip abduction x 10 each LAQ with 3# 2 x 10 each Seated hamstring curl GTB x10 BIL Supine hip adduction ball squeeze 5" hold 2x10   [facility] Adult PT Treatment:                                                DATE: 10/19/2022 Therapeutic Exercise: NuStep L6 x 5 min with LE only while taking subjective Supine quad set with ball under knee 10 x 5 sec SLR x 10 each SAQ with 4# 2 x 10 each Bridge 2 x 10 Sidelying hip abduction 2 x 10 each LAQ with 3# 2 x 10 each    PATIENT EDUCATION:  Education details: HEP Person educated: Patient Education method: Consulting civil engineer, Demonstration, Corporate treasurer cues, Verbal cues Education comprehension: verbalized understanding, returned demonstration, verbal cues required, tactile cues required, and needs further education   HOME EXERCISE PROGRAM: Access Code: [email or web address]      ASSESSMENT: CLINICAL IMPRESSION: ***  Patient presents to PT with pain in BIL knees, R>L, and reports HEP compliance. Session today continued to focus on BIL LE strengthening with mat based exercises due to high levels of pain. She is somewhat limited by pain throughout session, but is able to complete all prescribed exercises. Patient continues to benefit from skilled PT services and should be progressed as able to improve functional independence.     OBJECTIVE IMPAIRMENTS: Abnormal gait, decreased activity tolerance, decreased balance, decreased ROM, decreased strength, impaired flexibility, improper body mechanics, obesity, and pain.    ACTIVITY LIMITATIONS: sitting, standing, squatting, stairs, and locomotion level   PARTICIPATION LIMITATIONS: meal prep, cleaning, shopping, community activity, and occupation   PERSONAL FACTORS: Fitness, Past/current experiences, Profession, and Time since onset of injury/illness/exacerbation are  also affecting patient's functional outcome.      GOALS: Goals reviewed with patient? Yes   SHORT TERM GOALS: Target date: 10/19/2022    Patient will be I with initial HEP in order to progress with therapy. Baseline: HEP provided at eval 10/19/2022: progressing Goal status: ONGOING   2.  Patient will report pain  level with work related tasks and stairs </= 6/10 in order to reduce functional limitations Baseline: 9-10/10 pain level 10/19/2022: 7/10 Goal status: ONGOING   3.  Patient will demonstrate proper squat (sit to stand) technique in order to indicate improved muscular control and reduce knee pain with household tasks and stairs Baseline: bilateral knee valgus, excessive forward trunk lean and decreased depth due to pain 10/19/2022: bilateral knee valgus Goal status: ONGOING   LONG TERM GOALS: Target date: 11/16/2022    Patient will be I with final HEP to maintain progress from PT. Baseline: HEP provided at eval Goal status: INITIAL   2.  Patient will report LEFS >/= 50/80 in order to indicate an improvement in her functional ability with activities including walking, stairs, and household tasks Baseline: 33/80 Goal status: INITIAL   3.  Patient will demonstrate bilateral knee strength 5/5 MMT to improve standing and walking tolerance as well as stair negotiation Baseline: grossly 4/5 MMT Goal status: INITIAL   4.  Patient will report pain level with work related tasks and stairs </= 3/10 in order to reduce functional limitations Baseline: 9-10/10 pain level Goal status: INITIAL   5. Patient will be able to maintain SLS >/= 15 sec bilaterally to indicate improve muscular control and single leg stability to reduce pain and improve stair negotiation. Baseline: unable to maintain SLS bilaterally Goal status: INITIAL     PLAN: PT FREQUENCY: 1-2x/week   PT DURATION: 8 weeks   PLANNED INTERVENTIONS: Therapeutic exercises, Therapeutic activity, Neuromuscular  re-education, Balance training, Gait training, Patient/Family education, Self Care, Joint mobilization, Joint manipulation, Aquatic Therapy, Dry Needling, Cryotherapy, Moist heat, Taping, Ionotophoresis 4mg /ml Dexamethasone, Manual therapy, and Re-evaluation   PLAN FOR NEXT SESSION: Review HEP and progress PRN, quad stretching as tolerated, progress knee and hip strengthening as able, progress to weight bearing exercises as tolerated, squat and step-up mechanics   Margarette Canada, PTA 11/08/22 6:32 PM

## 2022-11-09 ENCOUNTER — Ambulatory Visit: Payer: Medicaid Other

## 2022-11-15 NOTE — Therapy (Signed)
OUTPATIENT PHYSICAL THERAPY TREATMENT NOTE   Patient Name: Lacey Whitaker MRN: [id number] DOB:[birth year], 36 y.o., female Today's Date: 11/16/2022  PCP: Lew Dawes, PA  REFERRING PROVIDER: Lew Dawes, PA    END OF SESSION:   PT End of Session - 11/16/22 1535     Visit Number 5    Number of Visits 13    Date for PT Re-Evaluation 01/11/23    Authorization Type [employer]    Authorization Time Period 09/27/22 - 11/25/21    Authorization - Visit Number 4    Authorization - Number of Visits 8    PT Start Time 1530    PT Stop Time 1615    PT Time Calculation (min) 45 min    Activity Tolerance Patient tolerated treatment well    Behavior During Therapy [facility] for tasks assessed/performed                Past Medical History:  Diagnosis Date   Asthma    Cyst, mediastinum    pt. reports having the cyst removed last year and has reoccured   Diabetes mellitus type II, controlled (HCC) 01/27/2020   Iron deficiency anemia due to chronic blood loss    Morbid obesity (HCC)    s/p gastric bypass surgery in 2009   Pregnancy induced hypertension    2006   Past Surgical History:  Procedure Laterality Date   BONE CYST EXCISION  2010   CESAREAN SECTION     CESAREAN SECTION  09/02/2011   Procedure: CESAREAN SECTION;  Surgeon: Purcell Nails, MD;  Location: [facility];  Service: Gynecology;  Laterality: N/A;   GASTRIC BYPASS  05/2008   GASTRIC BYPASS  2009   WISDOM TOOTH EXTRACTION  09/2010   Patient Active Problem List   Diagnosis Date Noted   Physical deconditioning 09/20/2020   History of COVID-19 09/20/2020   Pneumonia due to COVID-19 virus 08/16/2020   Shortness of breath 08/16/2020   Snoring 08/16/2020   COVID-19 08/01/2020   Acute respiratory failure with hypoxia (HCC)    Diabetes mellitus type II, controlled (HCC) 01/27/2020   Asthma, chronic    HTN (hypertension)    Iron deficiency anemia due to chronic blood loss    Obesities, morbid (HCC)  08/10/2011    REFERRING DIAG: Osteoarthritis of knee, unspecified   THERAPY DIAG:  Chronic pain of right knee  Chronic pain of left knee  Muscle weakness (generalized)  Rationale for Evaluation and Treatment Rehabilitation  PERTINENT HISTORY: See PMH above   PRECAUTIONS: None   SUBJECTIVE:       SUBJECTIVE STATEMENT:  Patient reports increased popping in the left knee recently. She feels this week has been more of a struggle. She was able to get a shorter chair at work recently where her feet can rest on the floor.  PAIN:  Are you having pain? Yes:  NPRS scale: 7/10 (9-10/10 with work related tasks) Pain location: Knee, right > left Pain description: Sharp, achy, dull Aggravating factors: Standing, walking, stairs Relieving factors: Rest, prop leg up, self massage, ice/heat   OBJECTIVE: (objective measures completed at initial evaluation unless otherwise dated) PATIENT SURVEYS:  LEFS 33/80  11/16/2022: 34/80    MUSCLE LENGTH: Quad limitation   POSTURE:             Bilateral knee valgus and pes planus   PALPATION: Tender lateral aspect of parapatellar region   LOWER EXTREMITY ROM:   Active ROM Right eval Left eval  Knee  flexion 110 115  Knee extension 10+ hyper 10+ hyper   (Blank rows = not tested)   LOWER EXTREMITY MMT:   MMT Right eval Left eval Rt / Lt 10/19/22 Rt / Lt 11/16/2022  Hip flexion 4 4  4  / 4  Hip extension        Hip abduction 4- 4- 4- / 4- 4- / 4-  Knee flexion 4 4  4  / 4  Knee extension 4 4 4  / 4 4 / 4   (Blank rows = not tested)   FUNCTIONAL TESTS:  Unable to maintain SLS bilaterally, trendelenburg when attempted  11/16/2022: right 28 seconds, left 7 seconds   Patient demonstrates squat pattern with bilateral knee valgus, excessive forward trunk lean and decreased depth due to pain 10/19/2022: bilateral knee valgus 11/16/2022: bilateral knee valgus   GAIT: Assistive device utilized: None Level of assistance: Complete  Independence Comments: Bilateral knee valgus, toe out with excessive pronation, trendelenburg     TODAY'S TREATMENT: [facility] Adult PT Treatment:                                                DATE: 11/16/2022 Therapeutic Exercise: NuStep L6 x 5 min with LE only while taking subjective LAQ with 3# 2 x 15 each SLR 2 x 10 each Bridge 2 x 10 Sidelying hip abduction 2 x 10 each Sit to stand 2 x 5 SLS 2 x 30 sec each   [facility] Adult PT Treatment:                                                DATE: 10/19/2022 Therapeutic Exercise: NuStep L6 x 5 min with LE only while taking subjective Supine quad set with ball under knee 10 x 5 sec SLR x 10 each SAQ with 4# 2 x 10 each Bridge 2 x 10 Sidelying hip abduction 2 x 10 each LAQ with 3# 2 x 10 each  [facility] Adult PT Treatment:                                                DATE: 09/28/2022 Therapeutic Exercise: Sidestepping at counter RTB at ankles x2 laps  Standing hip extension RTB ankles 2x10 BIL LAQ 3# 2x10 BIL Seated hamstring curl GTB x10 BIL Bridges x10 Supine hip adduction ball squeeze 5" hold x10 Neuromuscular re-ed: FT stance x30" EO/EC FT on Airex x30" EO Semi tandem stance x30" BIL Semi tandem stance on Airex x30" BIL SLS x30" BIL (occasional UE support to maintain balance)  PATIENT EDUCATION:  Education details: POC extension, HEP Person educated: Patient Education method: Explanation, Demonstration, Tactile cues, Verbal cues, Handout Education comprehension: verbalized understanding, returned demonstration, verbal cues required, tactile cues required, and needs further education   HOME EXERCISE PROGRAM: Access Code: [email or web address]      ASSESSMENT: CLINICAL IMPRESSION: Patient tolerated therapy well with no adverse effects. She has made gradual progression in therapy and reports improvement in her functional ability on LEFS compared to evaluation. She has not achieved any of her established goals at this time. Therapy continues  to  focus on progressing her strength and control in order to reduce knee pain. She is progressing with her strengthening exercises and updated her HEP this visit. She has only been able to complete 5 visits up to this point due to scheduling difficulties. Patient would benefit from continued skilled PT to progress her mobility and strength in order to reduce pain and maximize functional ability, so will extend PT POC for 8 more weeks.     OBJECTIVE IMPAIRMENTS: Abnormal gait, decreased activity tolerance, decreased balance, decreased ROM, decreased strength, impaired flexibility, improper body mechanics, obesity, and pain.    ACTIVITY LIMITATIONS: sitting, standing, squatting, stairs, and locomotion level   PARTICIPATION LIMITATIONS: meal prep, cleaning, shopping, community activity, and occupation   PERSONAL FACTORS: Fitness, Past/current experiences, Profession, and Time since onset of injury/illness/exacerbation are also affecting patient's functional outcome.      GOALS: Goals reviewed with patient? Yes   SHORT TERM GOALS: Target date: 12/14/2022   Patient will be I with initial HEP in order to progress with therapy. Baseline: HEP provided at eval 10/19/2022: progressing 11/16/2022: progressing Goal status: ONGOING   2.  Patient will report pain level with work related tasks and stairs </= 6/10 in order to reduce functional limitations Baseline: 9-10/10 pain level 10/19/2022: 7/10 11/16/2022: 7/10 Goal status: ONGOING   3.  Patient will demonstrate proper squat (sit to stand) technique in order to indicate improved muscular control and reduce knee pain with household tasks and stairs Baseline: bilateral knee valgus, excessive forward trunk lean and decreased depth due to pain 10/19/2022: bilateral knee valgus 11/16/2022: bilateral knee valgus Goal status: ONGOING   LONG TERM GOALS: Target date: 01/11/2023   Patient will be I with final HEP to maintain progress from  PT. Baseline: HEP provided at eval 11/16/2022: progressing Goal status: ONGOING   2.  Patient will report LEFS >/= 50/80 in order to indicate an improvement in her functional ability with activities including walking, stairs, and household tasks Baseline: 33/80 11/16/2022: 34/80 Goal status: ONGOING   3.  Patient will demonstrate bilateral knee strength 5/5 MMT to improve standing and walking tolerance as well as stair negotiation Baseline: grossly 4/5 MMT 11/16/2022: grossly 4/5 MMT Goal status: ONGOING   4.  Patient will report pain level with work related tasks and stairs </= 3/10 in order to reduce functional limitations Baseline: 9-10/10 pain level 11/16/2022: 7/10 Goal status: ONGOING   5. Patient will be able to maintain SLS >/= 15 sec bilaterally to indicate improve muscular control and single leg stability to reduce pain and improve stair negotiation. Baseline: unable to maintain SLS bilaterally 11/16/2022: right 28 seconds, left 7 seconds Goal status: ONGOING     PLAN: PT FREQUENCY: 1-2x/week   PT DURATION: 8 weeks   PLANNED INTERVENTIONS: Therapeutic exercises, Therapeutic activity, Neuromuscular re-education, Balance training, Gait training, Patient/Family education, Self Care, Joint mobilization, Joint manipulation, Aquatic Therapy, Dry Needling, Cryotherapy, Moist heat, Taping, Ionotophoresis 4mg /ml Dexamethasone, Manual therapy, and Re-evaluation   PLAN FOR NEXT SESSION: Review HEP and progress PRN, quad stretching as tolerated, progress knee and hip strengthening as able, progress to weight bearing exercises as tolerated, squat and step-up mechanics   , PT, DPT, LAT, ATC 11/16/22  4:21 PM Phone: [phone] Fax: [phone]

## 2022-11-16 ENCOUNTER — Ambulatory Visit: Payer: Medicaid Other | Admitting: Physical Therapy

## 2022-11-16 ENCOUNTER — Encounter: Payer: Self-pay | Admitting: Physical Therapy

## 2022-11-16 ENCOUNTER — Other Ambulatory Visit: Payer: Self-pay

## 2022-11-16 DIAGNOSIS — M6281 Muscle weakness (generalized): Secondary | ICD-10-CM

## 2022-11-16 DIAGNOSIS — G8929 Other chronic pain: Secondary | ICD-10-CM

## 2022-11-16 DIAGNOSIS — M25561 Pain in right knee: Secondary | ICD-10-CM | POA: Diagnosis not present

## 2022-11-16 NOTE — Patient Instructions (Signed)
Access Code: [email or web address] URL: [email or web address] Date: 11/16/2022 Prepared by: Rosana Hoes  Exercises - Active Straight Leg Raise with Quad Set  - 1 x daily - 3 sets - 10 reps - Bridge  - 1 x daily - 3 sets - 10 reps - Sidelying Hip Abduction  - 1 x daily - 3 sets - 10 reps - Seated Knee Extension with Resistance  - 1 x daily - 3 sets - 10 reps - Sit to Stand Without Arm Support  - 1 x daily - 3 sets - 5 reps - Standing Single Leg Stance with Counter Support  - 1 x daily - 3 sets - 30 seconds hold

## 2022-11-22 NOTE — Therapy (Signed)
OUTPATIENT PHYSICAL THERAPY TREATMENT NOTE   Patient Name: Lacey Whitaker MRN: [id number] DOB:January 23, 1986, 37 y.o., female Today's Date: 11/24/2022  PCP: Lew Dawes, PA  REFERRING PROVIDER: Lew Dawes, PA    END OF SESSION:   PT End of Session - 11/23/22 1629     Visit Number 6    Number of Visits 13    Date for PT Re-Evaluation 01/18/23    Authorization Type [employer]    Authorization Time Period 09/27/22 - 11/25/21    Authorization - Visit Number 5    Authorization - Number of Visits 8    PT Start Time 1615    PT Stop Time 1700    PT Time Calculation (min) 45 min    Activity Tolerance Patient tolerated treatment well    Behavior During Therapy [facility] for tasks assessed/performed                 Past Medical History:  Diagnosis Date   Asthma    Cyst, mediastinum    pt. reports having the cyst removed last year and has reoccured   Diabetes mellitus type II, controlled (HCC) 01/27/2020   Iron deficiency anemia due to chronic blood loss    Morbid obesity (HCC)    s/p gastric bypass surgery in 2009   Pregnancy induced hypertension    2006   Past Surgical History:  Procedure Laterality Date   BONE CYST EXCISION  2010   CESAREAN SECTION     CESAREAN SECTION  09/02/2011   Procedure: CESAREAN SECTION;  Surgeon: Purcell Nails, MD;  Location: [facility];  Service: Gynecology;  Laterality: N/A;   GASTRIC BYPASS  05/2008   GASTRIC BYPASS  2009   WISDOM TOOTH EXTRACTION  09/2010   Patient Active Problem List   Diagnosis Date Noted   Physical deconditioning 09/20/2020   History of COVID-19 09/20/2020   Pneumonia due to COVID-19 virus 08/16/2020   Shortness of breath 08/16/2020   Snoring 08/16/2020   COVID-19 08/01/2020   Acute respiratory failure with hypoxia (HCC)    Diabetes mellitus type II, controlled (HCC) 01/27/2020   Asthma, chronic    HTN (hypertension)    Iron deficiency anemia due to chronic blood loss    Obesities, morbid (HCC)  08/10/2011    REFERRING DIAG: Osteoarthritis of knee, unspecified; Radiculapthy, cervical region  THERAPY DIAG:  Cervicalgia  Chronic pain of right knee  Chronic pain of left knee  Muscle weakness (generalized)  Rationale for Evaluation and Treatment Rehabilitation  PERTINENT HISTORY: See PMH above   PRECAUTIONS: None   SUBJECTIVE:       SUBJECTIVE STATEMENT:  Patient reports nerve pain that started when she hit her left shoulder, where a bump appeared and then her left hand would start tingling. The tingling and numbness started traveling from her left hand to her neck, and she would get some pain on the outside of the left shoulder and neck. Currently it happens every day, sometimes it comes and goes. She has been taking gabapentin without much relief. She feels like when she has her bra on she will have more symptoms and she gets some relief without her bra. She does feel like it is harder to open things with the left arm. She is continuing to have bilateral knee pain, having to go up the steps one at a time. States that she woke up this morning with more pain.   Patient is right handed  PAIN:  Are you having pain?  Yes:  NPRS scale: 7/10 (9-10/10 with work related tasks) Pain location: Knee, right > left Pain description: Sharp, achy, dull Aggravating factors: Standing, walking, stairs Relieving factors: Rest, prop leg up, self massage, ice/heat  NPRS scale: 0/10 Pain location: Neck, left shoulder and arm Pain description: Tingling, numbness Aggravating factors: Having bra on will aggravate Relieving factors: Rest, having bra off   OBJECTIVE: (objective measures completed at initial evaluation unless otherwise dated) PATIENT SURVEYS:  LEFS 33/80  11/16/2022: 34/80  QuickDASH: 36.4% disability - 11/23/2022    MUSCLE LENGTH: Quad limitation   POSTURE:             Bilateral knee valgus and pes planus   PALPATION: Tender lateral aspect of parapatellar  region 11/23/2022: Exquisite tenderness noted left upper trap, rhomboid, and infraspinatus region, with reproduction of left UE symptoms with palpation of infraspinatus trigger points   LOWER EXTREMITY ROM:   Active ROM Right eval Left eval  Knee flexion 110 115  Knee extension 10+ hyper 10+ hyper   (Blank rows = not tested)   LOWER EXTREMITY MMT:   MMT Right eval Left eval Rt / Lt 10/19/22 Rt / Lt 11/16/2022  Hip flexion 4 4  4  / 4  Hip extension        Hip abduction 4- 4- 4- / 4- 4- / 4-  Knee flexion 4 4  4  / 4  Knee extension 4 4 4  / 4 4 / 4   (Blank rows = not tested)   FUNCTIONAL TESTS:  Unable to maintain SLS bilaterally, trendelenburg when attempted  11/16/2022: right 28 seconds, left 7 seconds   Patient demonstrates squat pattern with bilateral knee valgus, excessive forward trunk lean and decreased depth due to pain 10/19/2022: bilateral knee valgus 11/16/2022: bilateral knee valgus   GAIT: Assistive device utilized: None Level of assistance: Complete Independence Comments: Bilateral knee valgus, toe out with excessive pronation, trendelenburg  CERVICAL ROM:   Active ROM A/PROM (deg) 11/23/2022  Flexion 60  Extension 45  Right lateral flexion 30  Left lateral flexion 30  Right rotation 60  Left rotation 70   (Blank rows = not tested)  UPPER EXTREMITY ROM:   UE AROM grossly WFL and equal bilaterally without pain  UPPER EXTREMITY MMT:  MMT Right 11/23/2022 Left 11/23/2022  Shoulder flexion 5 5  Shoulder extension 5 5  Shoulder abduction 5 5  Shoulder internal rotation 5 5  Shoulder external rotation 5 5  Middle trapezius 4- 4-  Lower trapezius 4- 4-  Elbow flexion 5 5  Elbow extension 5 5  Grip strength 50 lbs 50 lbs  (Blank rows = not tested)     Patient did exhibit greater effort on left with grip testing   SENSATION: Patient reports tingling/numbness of left forearm region, not specific dermatomal pattern    TODAY'S TREATMENT: [facility]  Adult PT Treatment:                                                DATE: 11/23/2022 Therapeutic Activity: Assessment of left UE and neck with objective measure for new and updated goals Therapeutic Exercise: SMFR using tennis ball against wall for left shoulder and neck region Seated double ER and scap retraction with green 2 x 10 Manual: Skilled palpation and monitoring of muscle tension while performing TPDN treatment STM / TPR for left  upper trap and infraspinatus region Trigger Point Dry Needling Treatment: Pre-treatment instruction: Patient instructed on dry needling rationale, procedures, and possible side effects including pain during treatment (achy,cramping feeling), bruising, drop of blood, lightheadedness, nausea, sweating. Patient Consent Given: Yes Education handout provided: No Muscles treated: left upper trap, infraspinatus  Needle size and number: .30x60mm x 3 Electrical stimulation performed: No Parameters: N/A Treatment response/outcome: Twitch response elicited and Palpable decrease in muscle tension Post-treatment instructions: Patient instructed to expect possible mild to moderate muscle soreness later today and/or tomorrow. Patient instructed in methods to reduce muscle soreness and to continue prescribed HEP. If patient was dry needled over the lung field, patient was instructed on signs and symptoms of pneumothorax and, however unlikely, to see immediate medical attention should they occur. Patient was also educated on signs and symptoms of infection and to seek medical attention should they occur. Patient verbalized understanding of these instructions and education.   [facility] Adult PT Treatment:                                                DATE: 11/16/2022 Therapeutic Exercise: NuStep L6 x 5 min with LE only while taking subjective LAQ with 3# 2 x 15 each SLR 2 x 10 each Bridge 2 x 10 Sidelying hip abduction 2 x 10 each Sit to stand 2 x 5 SLS 2 x 30 sec each  [facility]  Adult PT Treatment:                                                DATE: 10/19/2022 Therapeutic Exercise: NuStep L6 x 5 min with LE only while taking subjective Supine quad set with ball under knee 10 x 5 sec SLR x 10 each SAQ with 4# 2 x 10 each Bridge 2 x 10 Sidelying hip abduction 2 x 10 each LAQ with 3# 2 x 10 each  PATIENT EDUCATION:  Education details: POC update, HEP update, TPDN Person educated: Patient Education method: Explanation, Demonstration, Tactile cues, Verbal cues, Handout Education comprehension: verbalized understanding, returned demonstration, verbal cues required, tactile cues required, and needs further education   HOME EXERCISE PROGRAM: Access Code: [email or web address]      ASSESSMENT: CLINICAL IMPRESSION: Patient tolerated therapy well with no adverse effects. She has a new PT referral for left side cervical radiculopathy. Based on current exam she does not seem to present specifically as a cervical radiculopathy as all testing was negative, however, she does report numbness and tingling of left hand and UE with unclear etiology. She did report significant muscular tenderness with reproduction of left UE symptoms with palpation for infraspinatus and upper trap region. Performed TPDN and manual therapy this visit with patient reporting improvement in left UE symptoms. She was provided and updated HEP to address deficits and would benefit from continued skilled PT to progress her mobility, strength, and postural control in order to reduce pain and maximize functional ability, so will extend PT POC for 8 more weeks.    OBJECTIVE IMPAIRMENTS: Abnormal gait, decreased activity tolerance, decreased balance, decreased ROM, decreased strength, impaired flexibility, improper body mechanics, obesity, and pain.    ACTIVITY LIMITATIONS: sitting, standing, squatting, stairs, and locomotion level   PARTICIPATION LIMITATIONS: meal prep, cleaning, shopping,  community activity, and  occupation   PERSONAL FACTORS: Fitness, Past/current experiences, Profession, and Time since onset of injury/illness/exacerbation are also affecting patient's functional outcome.      GOALS: Goals reviewed with patient? Yes   SHORT TERM GOALS: Target date: 12/21/2022   Patient will be I with initial HEP in order to progress with therapy. Baseline: HEP provided at eval 10/19/2022: progressing 11/16/2022: progressing Goal status: PARTIALLY MET   2.  Patient will report pain level with work related tasks and stairs </= 6/10 in order to reduce functional limitations Baseline: 9-10/10 pain level 10/19/2022: 7/10 11/16/2022: 7/10 Goal status: PARTIALLY MET   3.  Patient will demonstrate proper squat (sit to stand) technique in order to indicate improved muscular control and reduce knee pain with household tasks and stairs Baseline: bilateral knee valgus, excessive forward trunk lean and decreased depth due to pain 10/19/2022: bilateral knee valgus 11/16/2022: bilateral knee valgus Goal status: PARTIALLY MET  4. Patient will report a 50% improvement in her left UE numbness and tingling symptoms to reduce functional limitations  Baseline: patrient reports daily numbness and tingling  Goal Status: INITIAL   LONG TERM GOALS: Target date: 01/18/2023    Patient will be I with final HEP to maintain progress from PT. Baseline: HEP provided at eval 11/16/2022: progressing Goal status: PARTIALLY MET   2.  Patient will report LEFS >/= 50/80 in order to indicate an improvement in her functional ability with activities including walking, stairs, and household tasks Baseline: 33/80 11/16/2022: 34/80 Goal status: PARTIALLY MET   3.  Patient will demonstrate bilateral knee strength 5/5 MMT to improve standing and walking tolerance as well as stair negotiation Baseline: grossly 4/5 MMT 11/16/2022: grossly 4/5 MMT Goal status: PARTIALLY MET   4.  Patient will report pain level with work related  tasks and stairs </= 3/10 in order to reduce functional limitations Baseline: 9-10/10 pain level 11/16/2022: 7/10 Goal status: PARTIALLY MET   5. Patient will be able to maintain SLS >/= 15 sec bilaterally to indicate improve muscular control and single leg stability to reduce pain and improve stair negotiation. Baseline: unable to maintain SLS bilaterally 11/16/2022: right 28 seconds, left 7 seconds Goal status: PARTIALLY MET  6. Patient will demonstrate parascapular strength grossly >/= 4/5 MMT in order to improve postural control and reduce neck, shoulder, and arm pain.   Baseline: grossly 4-/5 MMT  Goal Status: INITIAL    7. Patient will report QuickDASH </= 20% disability in order to indicate improvement in her functional ability using the left UE for daily tasks  Baseline: 36.4%  Goal Status: INITIAL     PLAN: PT FREQUENCY: 1x/week   PT DURATION: 8 weeks   PLANNED INTERVENTIONS: Therapeutic exercises, Therapeutic activity, Neuromuscular re-education, Balance training, Gait training, Patient/Family education, Self Care, Joint mobilization, Joint manipulation, Aquatic Therapy, Dry Needling, Cryotherapy, Moist heat, Taping, Ionotophoresis 4mg /ml Dexamethasone, Manual therapy, and Re-evaluation   PLAN FOR NEXT SESSION: Review HEP and progress PRN, quad stretching as tolerated, progress knee and hip strengthening as able, progress to weight bearing exercises as tolerated, squat and step-up mechanics; TPDN/manual for left neck and posterior shoulder region, postural strengthening   , PT, DPT, LAT, ATC 11/24/22  9:03 AM Phone: [phone] Fax: [phone]    Check all possible CPT codes: [phone] - PT Re-evaluation, 97110- Therapeutic Exercise, [phone]- Neuro Re-education, [phone] - Gait Training, [phone] - Manual Therapy, 97530 - Therapeutic Activities, and 97535 - Self Care    Check all conditions that  are expected to impact treatment: Morbid obesity and Musculoskeletal  disorders   If treatment provided at initial evaluation, no treatment charged due to lack of authorization.

## 2022-11-23 ENCOUNTER — Ambulatory Visit: Payer: Medicaid Other | Attending: Medical | Admitting: Physical Therapy

## 2022-11-23 ENCOUNTER — Other Ambulatory Visit: Payer: Self-pay

## 2022-11-23 ENCOUNTER — Encounter: Payer: Self-pay | Admitting: Physical Therapy

## 2022-11-23 DIAGNOSIS — M25562 Pain in left knee: Secondary | ICD-10-CM | POA: Insufficient documentation

## 2022-11-23 DIAGNOSIS — M25561 Pain in right knee: Secondary | ICD-10-CM | POA: Diagnosis present

## 2022-11-23 DIAGNOSIS — M542 Cervicalgia: Secondary | ICD-10-CM | POA: Diagnosis present

## 2022-11-23 DIAGNOSIS — M6281 Muscle weakness (generalized): Secondary | ICD-10-CM | POA: Insufficient documentation

## 2022-11-23 DIAGNOSIS — G8929 Other chronic pain: Secondary | ICD-10-CM | POA: Diagnosis present

## 2022-11-23 NOTE — Patient Instructions (Signed)
Access Code: [email or web address] URL: [email or web address] Date: 11/23/2022 Prepared by: Hilda Blades  Exercises - Active Straight Leg Raise with Quad Set  - 1 x daily - 3 sets - 10 reps - Bridge  - 1 x daily - 3 sets - 10 reps - Sidelying Hip Abduction  - 1 x daily - 3 sets - 10 reps - Seated Knee Extension with Resistance  - 1 x daily - 3 sets - 10 reps - Sit to Stand Without Arm Support  - 1 x daily - 3 sets - 5 reps - Standing Single Leg Stance with Counter Support  - 1 x daily - 3 sets - 30 seconds hold - Standing Upper Trapezius Mobilization with Small Ball  - Shoulder External Rotation and Scapular Retraction with Resistance  - 1 x daily - 3 sets - 10 reps

## 2022-11-29 ENCOUNTER — Ambulatory Visit: Payer: Medicaid Other

## 2022-11-30 ENCOUNTER — Ambulatory Visit: Payer: Medicaid Other

## 2022-11-30 NOTE — Therapy (Incomplete)
OUTPATIENT PHYSICAL THERAPY TREATMENT NOTE   Patient Name: Lacey Whitaker MRN: [id number] DOB:02/21/1986, 37 y.o., female Today's Date: 11/30/2022  PCP: Claudius Sis, PA  REFERRING PROVIDER: Claudius Sis, PA    END OF SESSION:         Past Medical History:  Diagnosis Date   Asthma    Cyst, mediastinum    pt. reports having the cyst removed last year and has reoccured   Diabetes mellitus type II, controlled ([place]) 01/27/2020   Iron deficiency anemia due to chronic blood loss    Morbid obesity ([place])    s/p gastric bypass surgery in 2009   Pregnancy induced hypertension    2006   Past Surgical History:  Procedure Laterality Date   BONE CYST EXCISION  2010   CESAREAN SECTION     CESAREAN SECTION  09/02/2011   Procedure: CESAREAN SECTION;  Surgeon: Delice Lesch, MD;  Location: [facility];  Service: Gynecology;  Laterality: N/A;   GASTRIC BYPASS  05/2008   GASTRIC BYPASS  2009   WISDOM TOOTH EXTRACTION  09/2010   Patient Active Problem List   Diagnosis Date Noted   Physical deconditioning 09/20/2020   History of COVID-19 09/20/2020   Pneumonia due to COVID-19 virus 08/16/2020   Shortness of breath 08/16/2020   Snoring 08/16/2020   COVID-19 08/01/2020   Acute respiratory failure with hypoxia ([facility])    Diabetes mellitus type II, controlled ([place]) 01/27/2020   Asthma, chronic    HTN (hypertension)    Iron deficiency anemia due to chronic blood loss    Obesities, morbid ([place]) 08/10/2011    REFERRING DIAG: Osteoarthritis of knee, unspecified; Radiculapthy, cervical region  THERAPY DIAG:  No diagnosis found.  Rationale for Evaluation and Treatment Rehabilitation  PERTINENT HISTORY: See PMH above   PRECAUTIONS: None   SUBJECTIVE:       SUBJECTIVE STATEMENT:  Patient reports nerve pain that started when she hit her left shoulder, where a bump appeared and then her left hand would start tingling. The tingling and numbness started traveling from her left  hand to her neck, and she would get some pain on the outside of the left shoulder and neck. Currently it happens every day, sometimes it comes and goes. She has been taking gabapentin without much relief. She feels like when she has her bra on she will have more symptoms and she gets some relief without her bra. She does feel like it is harder to open things with the left arm. She is continuing to have bilateral knee pain, having to go up the steps one at a time. States that she woke up this morning with more pain.   Patient is right handed  PAIN:  Are you having pain? Yes:  NPRS scale: 7/10 (9-10/10 with work related tasks) Pain location: Knee, right > left Pain description: Sharp, achy, dull Aggravating factors: Standing, walking, stairs Relieving factors: Rest, prop leg up, self massage, ice/heat  NPRS scale: 0/10 Pain location: Neck, left shoulder and arm Pain description: Tingling, numbness Aggravating factors: Having bra on will aggravate Relieving factors: Rest, having bra off   OBJECTIVE: (objective measures completed at initial evaluation unless otherwise dated) PATIENT SURVEYS:  LEFS 33/80  11/16/2022: 34/80  QuickDASH: 36.4% disability - 11/23/2022    MUSCLE LENGTH: Quad limitation   POSTURE:             Bilateral knee valgus and pes planus   PALPATION: Tender lateral aspect of parapatellar region 11/23/2022: Exquisite tenderness  noted left upper trap, rhomboid, and infraspinatus region, with reproduction of left UE symptoms with palpation of infraspinatus trigger points   LOWER EXTREMITY ROM:   Active ROM Right eval Left eval  Knee flexion 110 115  Knee extension 10+ hyper 10+ hyper   (Blank rows = not tested)   LOWER EXTREMITY MMT:   MMT Right eval Left eval Rt / Lt 10/19/22 Rt / Lt 11/16/2022  Hip flexion 4 4  4  / 4  Hip extension        Hip abduction 4- 4- 4- / 4- 4- / 4-  Knee flexion 4 4  4  / 4  Knee extension 4 4 4  / 4 4 / 4   (Blank rows = not  tested)   FUNCTIONAL TESTS:  Unable to maintain SLS bilaterally, trendelenburg when attempted  11/16/2022: right 28 seconds, left 7 seconds   Patient demonstrates squat pattern with bilateral knee valgus, excessive forward trunk lean and decreased depth due to pain 10/19/2022: bilateral knee valgus 11/16/2022: bilateral knee valgus   GAIT: Assistive device utilized: None Level of assistance: Complete Independence Comments: Bilateral knee valgus, toe out with excessive pronation, trendelenburg  CERVICAL ROM:   Active ROM A/PROM (deg) 11/23/2022  Flexion 60  Extension 45  Right lateral flexion 30  Left lateral flexion 30  Right rotation 60  Left rotation 70   (Blank rows = not tested)  UPPER EXTREMITY ROM:   UE AROM grossly WFL and equal bilaterally without pain  UPPER EXTREMITY MMT:  MMT Right 11/23/2022 Left 11/23/2022  Shoulder flexion 5 5  Shoulder extension 5 5  Shoulder abduction 5 5  Shoulder internal rotation 5 5  Shoulder external rotation 5 5  Middle trapezius 4- 4-  Lower trapezius 4- 4-  Elbow flexion 5 5  Elbow extension 5 5  Grip strength 50 lbs 50 lbs  (Blank rows = not tested)     Patient did exhibit greater effort on left with grip testing   SENSATION: Patient reports tingling/numbness of left forearm region, not specific dermatomal pattern    TODAY'S TREATMENT: [facility] Adult PT Treatment:                                                DATE: 11/30/2022 Therapeutic Exercise: Nustep level 6 x 5 mins UE/LE SNAGs Cervical extension over towel  Seated horizontal abduction Seated diagonals High/low rows Lat pull down Pball roll up wall with alternating UE lift off Seated double ER with scap retraction Sidelying open books Doorway pec stretch   [facility] Adult PT Treatment:                                                DATE: 11/23/2022 Therapeutic Activity: Assessment of left UE and neck with objective measure for new and updated goals Therapeutic  Exercise: SMFR using tennis ball against wall for left shoulder and neck region Seated double ER and scap retraction with green 2 x 10 Manual: Skilled palpation and monitoring of muscle tension while performing TPDN treatment STM / TPR for left upper trap and infraspinatus region Trigger Point Dry Needling Treatment: Pre-treatment instruction: Patient instructed on dry needling rationale, procedures, and possible side effects including pain during treatment (achy,cramping  feeling), bruising, drop of blood, lightheadedness, nausea, sweating. Patient Consent Given: Yes Education handout provided: No Muscles treated: left upper trap, infraspinatus  Needle size and number: .30x70mm x 3 Electrical stimulation performed: No Parameters: N/A Treatment response/outcome: Twitch response elicited and Palpable decrease in muscle tension Post-treatment instructions: Patient instructed to expect possible mild to moderate muscle soreness later today and/or tomorrow. Patient instructed in methods to reduce muscle soreness and to continue prescribed HEP. If patient was dry needled over the lung field, patient was instructed on signs and symptoms of pneumothorax and, however unlikely, to see immediate medical attention should they occur. Patient was also educated on signs and symptoms of infection and to seek medical attention should they occur. Patient verbalized understanding of these instructions and education.   [facility] Adult PT Treatment:                                                DATE: 11/16/2022 Therapeutic Exercise: NuStep L6 x 5 min with LE only while taking subjective LAQ with 3# 2 x 15 each SLR 2 x 10 each Bridge 2 x 10 Sidelying hip abduction 2 x 10 each Sit to stand 2 x 5 SLS 2 x 30 sec each   PATIENT EDUCATION:  Education details: POC update, HEP update, TPDN Person educated: Patient Education method: Explanation, Demonstration, Tactile cues, Verbal cues, Handout Education  comprehension: verbalized understanding, returned demonstration, verbal cues required, tactile cues required, and needs further education   HOME EXERCISE PROGRAM: Access Code: [email or web address]      ASSESSMENT: CLINICAL IMPRESSION: ***  Patient tolerated therapy well with no adverse effects. She has a new PT referral for left side cervical radiculopathy. Based on current exam she does not seem to present specifically as a cervical radiculopathy as all testing was negative, however, she does report numbness and tingling of left hand and UE with unclear etiology. She did report significant muscular tenderness with reproduction of left UE symptoms with palpation for infraspinatus and upper trap region. Performed TPDN and manual therapy this visit with patient reporting improvement in left UE symptoms. She was provided and updated HEP to address deficits and would benefit from continued skilled PT to progress her mobility, strength, and postural control in order to reduce pain and maximize functional ability, so will extend PT POC for 8 more weeks.    OBJECTIVE IMPAIRMENTS: Abnormal gait, decreased activity tolerance, decreased balance, decreased ROM, decreased strength, impaired flexibility, improper body mechanics, obesity, and pain.    ACTIVITY LIMITATIONS: sitting, standing, squatting, stairs, and locomotion level   PARTICIPATION LIMITATIONS: meal prep, cleaning, shopping, community activity, and occupation   PERSONAL FACTORS: Fitness, Past/current experiences, Profession, and Time since onset of injury/illness/exacerbation are also affecting patient's functional outcome.      GOALS: Goals reviewed with patient? Yes   SHORT TERM GOALS: Target date: 12/21/2022   Patient will be I with initial HEP in order to progress with therapy. Baseline: HEP provided at eval 10/19/2022: progressing 11/16/2022: progressing Goal status: PARTIALLY MET   2.  Patient will report pain level with work related tasks  and stairs </= 6/10 in order to reduce functional limitations Baseline: 9-10/10 pain level 10/19/2022: 7/10 11/16/2022: 7/10 Goal status: PARTIALLY MET   3.  Patient will demonstrate proper squat (sit to stand) technique in order to indicate improved muscular control and reduce knee pain with  household tasks and stairs Baseline: bilateral knee valgus, excessive forward trunk lean and decreased depth due to pain 10/19/2022: bilateral knee valgus 11/16/2022: bilateral knee valgus Goal status: PARTIALLY MET  4. Patient will report a 50% improvement in her left UE numbness and tingling symptoms to reduce functional limitations  Baseline: patrient reports daily numbness and tingling  Goal Status: INITIAL   LONG TERM GOALS: Target date: 01/18/2023    Patient will be I with final HEP to maintain progress from PT. Baseline: HEP provided at eval 11/16/2022: progressing Goal status: PARTIALLY MET   2.  Patient will report LEFS >/= 50/80 in order to indicate an improvement in her functional ability with activities including walking, stairs, and household tasks Baseline: 33/80 11/16/2022: 34/80 Goal status: PARTIALLY MET   3.  Patient will demonstrate bilateral knee strength 5/5 MMT to improve standing and walking tolerance as well as stair negotiation Baseline: grossly 4/5 MMT 11/16/2022: grossly 4/5 MMT Goal status: PARTIALLY MET   4.  Patient will report pain level with work related tasks and stairs </= 3/10 in order to reduce functional limitations Baseline: 9-10/10 pain level 11/16/2022: 7/10 Goal status: PARTIALLY MET   5. Patient will be able to maintain SLS >/= 15 sec bilaterally to indicate improve muscular control and single leg stability to reduce pain and improve stair negotiation. Baseline: unable to maintain SLS bilaterally 11/16/2022: right 28 seconds, left 7 seconds Goal status: PARTIALLY MET  6. Patient will demonstrate parascapular strength grossly >/= 4/5 MMT in order  to improve postural control and reduce neck, shoulder, and arm pain.   Baseline: grossly 4-/5 MMT  Goal Status: INITIAL    7. Patient will report QuickDASH </= 20% disability in order to indicate improvement in her functional ability using the left UE for daily tasks  Baseline: 36.4%  Goal Status: INITIAL     PLAN: PT FREQUENCY: 1x/week   PT DURATION: 8 weeks   PLANNED INTERVENTIONS: Therapeutic exercises, Therapeutic activity, Neuromuscular re-education, Balance training, Gait training, Patient/Family education, Self Care, Joint mobilization, Joint manipulation, Aquatic Therapy, Dry Needling, Cryotherapy, Moist heat, Taping, Ionotophoresis 4mg /ml Dexamethasone, Manual therapy, and Re-evaluation   PLAN FOR NEXT SESSION: Review HEP and progress PRN, quad stretching as tolerated, progress knee and hip strengthening as able, progress to weight bearing exercises as tolerated, squat and step-up mechanics; TPDN/manual for left neck and posterior shoulder region, postural strengthening   , PTA 11/30/22 1:15 PM

## 2022-12-07 ENCOUNTER — Ambulatory Visit: Payer: Medicaid Other

## 2022-12-07 NOTE — Therapy (Incomplete)
OUTPATIENT PHYSICAL THERAPY TREATMENT NOTE   Patient Name: Lacey Whitaker MRN: [id number] DOB:December 07, 1985, 37 y.o., female Today's Date: 12/07/2022  PCP: Lew Dawes, PA  REFERRING PROVIDER: Lew Dawes, PA    END OF SESSION:         Past Medical History:  Diagnosis Date   Asthma    Cyst, mediastinum    pt. reports having the cyst removed last year and has reoccured   Diabetes mellitus type II, controlled (HCC) 01/27/2020   Iron deficiency anemia due to chronic blood loss    Morbid obesity (HCC)    s/p gastric bypass surgery in 2009   Pregnancy induced hypertension    2006   Past Surgical History:  Procedure Laterality Date   BONE CYST EXCISION  2010   CESAREAN SECTION     CESAREAN SECTION  09/02/2011   Procedure: CESAREAN SECTION;  Surgeon: Purcell Nails, MD;  Location: [facility];  Service: Gynecology;  Laterality: N/A;   GASTRIC BYPASS  05/2008   GASTRIC BYPASS  2009   WISDOM TOOTH EXTRACTION  09/2010   Patient Active Problem List   Diagnosis Date Noted   Physical deconditioning 09/20/2020   History of COVID-19 09/20/2020   Pneumonia due to COVID-19 virus 08/16/2020   Shortness of breath 08/16/2020   Snoring 08/16/2020   COVID-19 08/01/2020   Acute respiratory failure with hypoxia (HCC)    Diabetes mellitus type II, controlled (HCC) 01/27/2020   Asthma, chronic    HTN (hypertension)    Iron deficiency anemia due to chronic blood loss    Obesities, morbid (HCC) 08/10/2011    REFERRING DIAG: Osteoarthritis of knee, unspecified; Radiculapthy, cervical region  THERAPY DIAG:  No diagnosis found.  Rationale for Evaluation and Treatment Rehabilitation  PERTINENT HISTORY: See PMH above   PRECAUTIONS: None   SUBJECTIVE:       SUBJECTIVE STATEMENT:  *** Patient reports nerve pain that started when she hit her left shoulder, where a bump appeared and then her left hand would start tingling. The tingling and numbness started traveling from her left  hand to her neck, and she would get some pain on the outside of the left shoulder and neck. Currently it happens every day, sometimes it comes and goes. She has been taking gabapentin without much relief. She feels like when she has her bra on she will have more symptoms and she gets some relief without her bra. She does feel like it is harder to open things with the left arm. She is continuing to have bilateral knee pain, having to go up the steps one at a time. States that she woke up this morning with more pain.   Patient is right handed  PAIN:  Are you having pain? Yes:  NPRS scale: ***7/10 (9-10/10 with work related tasks) Pain location: Knee, right > left Pain description: Sharp, achy, dull Aggravating factors: Standing, walking, stairs Relieving factors: Rest, prop leg up, self massage, ice/heat  NPRS scale: 0/10 Pain location: Neck, left shoulder and arm Pain description: Tingling, numbness Aggravating factors: Having bra on will aggravate Relieving factors: Rest, having bra off   OBJECTIVE: (objective measures completed at initial evaluation unless otherwise dated) PATIENT SURVEYS:  LEFS 33/80  11/16/2022: 34/80  QuickDASH: 36.4% disability - 11/23/2022    MUSCLE LENGTH: Quad limitation   POSTURE:             Bilateral knee valgus and pes planus   PALPATION: Tender lateral aspect of parapatellar region 11/23/2022: Exquisite  tenderness noted left upper trap, rhomboid, and infraspinatus region, with reproduction of left UE symptoms with palpation of infraspinatus trigger points   LOWER EXTREMITY ROM:   Active ROM Right eval Left eval  Knee flexion 110 115  Knee extension 10+ hyper 10+ hyper   (Blank rows = not tested)   LOWER EXTREMITY MMT:   MMT Right eval Left eval Rt / Lt 10/19/22 Rt / Lt 11/16/2022  Hip flexion 4 4  4  / 4  Hip extension        Hip abduction 4- 4- 4- / 4- 4- / 4-  Knee flexion 4 4  4  / 4  Knee extension 4 4 4  / 4 4 / 4   (Blank rows =  not tested)   FUNCTIONAL TESTS:  Unable to maintain SLS bilaterally, trendelenburg when attempted  11/16/2022: right 28 seconds, left 7 seconds   Patient demonstrates squat pattern with bilateral knee valgus, excessive forward trunk lean and decreased depth due to pain 10/19/2022: bilateral knee valgus 11/16/2022: bilateral knee valgus   GAIT: Assistive device utilized: None Level of assistance: Complete Independence Comments: Bilateral knee valgus, toe out with excessive pronation, trendelenburg  CERVICAL ROM:   Active ROM A/PROM (deg) 11/23/2022  Flexion 60  Extension 45  Right lateral flexion 30  Left lateral flexion 30  Right rotation 60  Left rotation 70   (Blank rows = not tested)  UPPER EXTREMITY ROM:   UE AROM grossly WFL and equal bilaterally without pain  UPPER EXTREMITY MMT:  MMT Right 11/23/2022 Left 11/23/2022  Shoulder flexion 5 5  Shoulder extension 5 5  Shoulder abduction 5 5  Shoulder internal rotation 5 5  Shoulder external rotation 5 5  Middle trapezius 4- 4-  Lower trapezius 4- 4-  Elbow flexion 5 5  Elbow extension 5 5  Grip strength 50 lbs 50 lbs  (Blank rows = not tested)     Patient did exhibit greater effort on left with grip testing   SENSATION: Patient reports tingling/numbness of left forearm region, not specific dermatomal pattern    TODAY'S TREATMENT: [facility] Adult PT Treatment:                                                DATE: 12/07/2022 Therapeutic Exercise: Nustep level 6 x 5 mins UE/LE SNAGs Cervical extension over towel  Seated horizontal abduction Seated diagonals High/low rows Lat pull down Pball roll up wall with alternating UE lift off Seated double ER with scap retraction Sidelying open books Doorway pec stretch   [facility] Adult PT Treatment:                                                DATE: 11/23/2022 Therapeutic Activity: Assessment of left UE and neck with objective measure for new and updated goals Therapeutic  Exercise: SMFR using tennis ball against wall for left shoulder and neck region Seated double ER and scap retraction with green 2 x 10 Manual: Skilled palpation and monitoring of muscle tension while performing TPDN treatment STM / TPR for left upper trap and infraspinatus region Trigger Point Dry Needling Treatment: Pre-treatment instruction: Patient instructed on dry needling rationale, procedures, and possible side effects including pain during treatment (  achy,cramping feeling), bruising, drop of blood, lightheadedness, nausea, sweating. Patient Consent Given: Yes Education handout provided: No Muscles treated: left upper trap, infraspinatus  Needle size and number: .30x64mm x 3 Electrical stimulation performed: No Parameters: N/A Treatment response/outcome: Twitch response elicited and Palpable decrease in muscle tension Post-treatment instructions: Patient instructed to expect possible mild to moderate muscle soreness later today and/or tomorrow. Patient instructed in methods to reduce muscle soreness and to continue prescribed HEP. If patient was dry needled over the lung field, patient was instructed on signs and symptoms of pneumothorax and, however unlikely, to see immediate medical attention should they occur. Patient was also educated on signs and symptoms of infection and to seek medical attention should they occur. Patient verbalized understanding of these instructions and education.   [facility] Adult PT Treatment:                                                DATE: 11/16/2022 Therapeutic Exercise: NuStep L6 x 5 min with LE only while taking subjective LAQ with 3# 2 x 15 each SLR 2 x 10 each Bridge 2 x 10 Sidelying hip abduction 2 x 10 each Sit to stand 2 x 5 SLS 2 x 30 sec each   PATIENT EDUCATION:  Education details: POC update, HEP update, TPDN Person educated: Patient Education method: Explanation, Demonstration, Tactile cues, Verbal cues, Handout Education  comprehension: verbalized understanding, returned demonstration, verbal cues required, tactile cues required, and needs further education   HOME EXERCISE PROGRAM: Access Code: [email or web address]      ASSESSMENT: CLINICAL IMPRESSION: ***  Patient tolerated therapy well with no adverse effects. She has a new PT referral for left side cervical radiculopathy. Based on current exam she does not seem to present specifically as a cervical radiculopathy as all testing was negative, however, she does report numbness and tingling of left hand and UE with unclear etiology. She did report significant muscular tenderness with reproduction of left UE symptoms with palpation for infraspinatus and upper trap region. Performed TPDN and manual therapy this visit with patient reporting improvement in left UE symptoms. She was provided and updated HEP to address deficits and would benefit from continued skilled PT to progress her mobility, strength, and postural control in order to reduce pain and maximize functional ability, so will extend PT POC for 8 more weeks.    OBJECTIVE IMPAIRMENTS: Abnormal gait, decreased activity tolerance, decreased balance, decreased ROM, decreased strength, impaired flexibility, improper body mechanics, obesity, and pain.    ACTIVITY LIMITATIONS: sitting, standing, squatting, stairs, and locomotion level   PARTICIPATION LIMITATIONS: meal prep, cleaning, shopping, community activity, and occupation   PERSONAL FACTORS: Fitness, Past/current experiences, Profession, and Time since onset of injury/illness/exacerbation are also affecting patient's functional outcome.      GOALS: Goals reviewed with patient? Yes   SHORT TERM GOALS: Target date: 12/21/2022   Patient will be I with initial HEP in order to progress with therapy. Baseline: HEP provided at eval 10/19/2022: progressing 11/16/2022: progressing Goal status: PARTIALLY MET   2.  Patient will report pain level with work related tasks  and stairs </= 6/10 in order to reduce functional limitations Baseline: 9-10/10 pain level 10/19/2022: 7/10 11/16/2022: 7/10 Goal status: PARTIALLY MET   3.  Patient will demonstrate proper squat (sit to stand) technique in order to indicate improved muscular control and reduce knee pain  with household tasks and stairs Baseline: bilateral knee valgus, excessive forward trunk lean and decreased depth due to pain 10/19/2022: bilateral knee valgus 11/16/2022: bilateral knee valgus Goal status: PARTIALLY MET  4. Patient will report a 50% improvement in her left UE numbness and tingling symptoms to reduce functional limitations  Baseline: patrient reports daily numbness and tingling  Goal Status: INITIAL   LONG TERM GOALS: Target date: 01/18/2023    Patient will be I with final HEP to maintain progress from PT. Baseline: HEP provided at eval 11/16/2022: progressing Goal status: PARTIALLY MET   2.  Patient will report LEFS >/= 50/80 in order to indicate an improvement in her functional ability with activities including walking, stairs, and household tasks Baseline: 33/80 11/16/2022: 34/80 Goal status: PARTIALLY MET   3.  Patient will demonstrate bilateral knee strength 5/5 MMT to improve standing and walking tolerance as well as stair negotiation Baseline: grossly 4/5 MMT 11/16/2022: grossly 4/5 MMT Goal status: PARTIALLY MET   4.  Patient will report pain level with work related tasks and stairs </= 3/10 in order to reduce functional limitations Baseline: 9-10/10 pain level 11/16/2022: 7/10 Goal status: PARTIALLY MET   5. Patient will be able to maintain SLS >/= 15 sec bilaterally to indicate improve muscular control and single leg stability to reduce pain and improve stair negotiation. Baseline: unable to maintain SLS bilaterally 11/16/2022: right 28 seconds, left 7 seconds Goal status: PARTIALLY MET  6. Patient will demonstrate parascapular strength grossly >/= 4/5 MMT in order  to improve postural control and reduce neck, shoulder, and arm pain.   Baseline: grossly 4-/5 MMT  Goal Status: INITIAL    7. Patient will report QuickDASH </= 20% disability in order to indicate improvement in her functional ability using the left UE for daily tasks  Baseline: 36.4%  Goal Status: INITIAL     PLAN: PT FREQUENCY: 1x/week   PT DURATION: 8 weeks   PLANNED INTERVENTIONS: Therapeutic exercises, Therapeutic activity, Neuromuscular re-education, Balance training, Gait training, Patient/Family education, Self Care, Joint mobilization, Joint manipulation, Aquatic Therapy, Dry Needling, Cryotherapy, Moist heat, Taping, Ionotophoresis 4mg /ml Dexamethasone, Manual therapy, and Re-evaluation   PLAN FOR NEXT SESSION: Review HEP and progress PRN, quad stretching as tolerated, progress knee and hip strengthening as able, progress to weight bearing exercises as tolerated, squat and step-up mechanics; TPDN/manual for left neck and posterior shoulder region, postural strengthening   , PTA 12/07/22 9:04 AM

## 2022-12-14 ENCOUNTER — Ambulatory Visit: Payer: Medicaid Other

## 2022-12-14 DIAGNOSIS — M542 Cervicalgia: Secondary | ICD-10-CM | POA: Diagnosis not present

## 2022-12-14 DIAGNOSIS — G8929 Other chronic pain: Secondary | ICD-10-CM

## 2022-12-14 DIAGNOSIS — M6281 Muscle weakness (generalized): Secondary | ICD-10-CM

## 2022-12-14 NOTE — Therapy (Signed)
OUTPATIENT PHYSICAL THERAPY TREATMENT NOTE   Patient Name: Lacey Whitaker MRN: [id number] DOB:October 10, 1986, 37 y.o., female Today's Date: 12/14/2022  PCP: Claudius Sis, PA  REFERRING PROVIDER: Claudius Sis, PA    END OF SESSION:   PT End of Session - 12/14/22 1703     Visit Number 7    Number of Visits 13    Date for PT Re-Evaluation 01/18/23    Authorization Type [employer]    Authorization Time Period 4 visits 11/28/22-12/27/22    Authorization - Visit Number 6    Authorization - Number of Visits 12    PT Start Time 1703    PT Stop Time 9937    PT Time Calculation (min) 40 min    Activity Tolerance Patient tolerated treatment well    Behavior During Therapy [facility] for tasks assessed/performed                  Past Medical History:  Diagnosis Date   Asthma    Cyst, mediastinum    pt. reports having the cyst removed last year and has reoccured   Diabetes mellitus type II, controlled ([place]) 01/27/2020   Iron deficiency anemia due to chronic blood loss    Morbid obesity ([place])    s/p gastric bypass surgery in 2009   Pregnancy induced hypertension    2006   Past Surgical History:  Procedure Laterality Date   BONE CYST EXCISION  2010   CESAREAN SECTION     CESAREAN SECTION  09/02/2011   Procedure: CESAREAN SECTION;  Surgeon: Delice Lesch, MD;  Location: [facility];  Service: Gynecology;  Laterality: N/A;   GASTRIC BYPASS  05/2008   GASTRIC BYPASS  2009   WISDOM TOOTH EXTRACTION  09/2010   Patient Active Problem List   Diagnosis Date Noted   Physical deconditioning 09/20/2020   History of COVID-19 09/20/2020   Pneumonia due to COVID-19 virus 08/16/2020   Shortness of breath 08/16/2020   Snoring 08/16/2020   COVID-19 08/01/2020   Acute respiratory failure with hypoxia (HCC)    Diabetes mellitus type II, controlled ([place]) 01/27/2020   Asthma, chronic    HTN (hypertension)    Iron deficiency anemia due to chronic blood loss    Obesities, morbid ([place])  08/10/2011    REFERRING DIAG: Osteoarthritis of knee, unspecified; Radiculapthy, cervical region  THERAPY DIAG:  Cervicalgia  Chronic pain of right knee  Chronic pain of left knee  Muscle weakness (generalized)  Rationale for Evaluation and Treatment Rehabilitation  PERTINENT HISTORY: See PMH above   PRECAUTIONS: None   SUBJECTIVE:       SUBJECTIVE STATEMENT:  Patient reports that her shoulders and neck feel like they ache more   PAIN:  Are you having pain? Yes:  NPRS scale: 7/10 (9-10/10 with work related tasks) Pain location: Knee, right > left Pain description: Sharp, achy, dull Aggravating factors: Standing, walking, stairs Relieving factors: Rest, prop leg up, self massage, ice/heat  NPRS scale: 5/10 Pain location: Neck, left shoulder and arm Pain description: Tingling, numbness Aggravating factors: Having bra on will aggravate Relieving factors: Rest, having bra off   OBJECTIVE: (objective measures completed at initial evaluation unless otherwise dated) PATIENT SURVEYS:  LEFS 33/80  11/16/2022: 34/80  QuickDASH: 36.4% disability - 11/23/2022    MUSCLE LENGTH: Quad limitation   POSTURE:             Bilateral knee valgus and pes planus   PALPATION: Tender lateral aspect of parapatellar region  11/23/2022: Exquisite tenderness noted left upper trap, rhomboid, and infraspinatus region, with reproduction of left UE symptoms with palpation of infraspinatus trigger points   LOWER EXTREMITY ROM:   Active ROM Right eval Left eval  Knee flexion 110 115  Knee extension 10+ hyper 10+ hyper   (Blank rows = not tested)   LOWER EXTREMITY MMT:   MMT Right eval Left eval Rt / Lt 10/19/22 Rt / Lt 11/16/2022  Hip flexion 4 4  4  / 4  Hip extension        Hip abduction 4- 4- 4- / 4- 4- / 4-  Knee flexion 4 4  4  / 4  Knee extension 4 4 4  / 4 4 / 4   (Blank rows = not tested)   FUNCTIONAL TESTS:  Unable to maintain SLS bilaterally, trendelenburg when  attempted  11/16/2022: right 28 seconds, left 7 seconds   Patient demonstrates squat pattern with bilateral knee valgus, excessive forward trunk lean and decreased depth due to pain 10/19/2022: bilateral knee valgus 11/16/2022: bilateral knee valgus   GAIT: Assistive device utilized: None Level of assistance: Complete Independence Comments: Bilateral knee valgus, toe out with excessive pronation, trendelenburg  CERVICAL ROM:   Active ROM A/PROM (deg) 11/23/2022  Flexion 60  Extension 45  Right lateral flexion 30  Left lateral flexion 30  Right rotation 60  Left rotation 70   (Blank rows = not tested)  UPPER EXTREMITY ROM:   UE AROM grossly WFL and equal bilaterally without pain  UPPER EXTREMITY MMT:  MMT Right 11/23/2022 Left 11/23/2022  Shoulder flexion 5 5  Shoulder extension 5 5  Shoulder abduction 5 5  Shoulder internal rotation 5 5  Shoulder external rotation 5 5  Middle trapezius 4- 4-  Lower trapezius 4- 4-  Elbow flexion 5 5  Elbow extension 5 5  Grip strength 50 lbs 50 lbs  (Blank rows = not tested)     Patient did exhibit greater effort on left with grip testing   SENSATION: Patient reports tingling/numbness of left forearm region, not specific dermatomal pattern    TODAY'S TREATMENT: [facility] Adult PT Treatment:                                                DATE: 12/14/2022 Therapeutic Exercise: Nustep level 6 x 5 mins UE/LE Seated horizontal abduction RTB 2x10 Seated diagonals x10 BIL High/low rows 25# 2x10 Lat pull down 25# 2x10 Pball roll up wall with alternating UE lift off x10 Seated double ER with scap retraction RTB 2x10 Self Care: Theracane self instruction   [facility] Adult PT Treatment:                                                DATE: 11/23/2022 Therapeutic Activity: Assessment of left UE and neck with objective measure for new and updated goals Therapeutic Exercise: SMFR using tennis ball against wall for left shoulder and neck  region Seated double ER and scap retraction with green 2 x 10 Manual: Skilled palpation and monitoring of muscle tension while performing TPDN treatment STM / TPR for left upper trap and infraspinatus region Trigger Point Dry Needling Treatment: Pre-treatment instruction: Patient instructed on dry needling rationale, procedures, and possible  side effects including pain during treatment (achy,cramping feeling), bruising, drop of blood, lightheadedness, nausea, sweating. Patient Consent Given: Yes Education handout provided: No Muscles treated: left upper trap, infraspinatus  Needle size and number: .30x14mm x 3 Electrical stimulation performed: No Parameters: N/A Treatment response/outcome: Twitch response elicited and Palpable decrease in muscle tension Post-treatment instructions: Patient instructed to expect possible mild to moderate muscle soreness later today and/or tomorrow. Patient instructed in methods to reduce muscle soreness and to continue prescribed HEP. If patient was dry needled over the lung field, patient was instructed on signs and symptoms of pneumothorax and, however unlikely, to see immediate medical attention should they occur. Patient was also educated on signs and symptoms of infection and to seek medical attention should they occur. Patient verbalized understanding of these instructions and education.   [facility] Adult PT Treatment:                                                DATE: 11/16/2022 Therapeutic Exercise: NuStep L6 x 5 min with LE only while taking subjective LAQ with 3# 2 x 15 each SLR 2 x 10 each Bridge 2 x 10 Sidelying hip abduction 2 x 10 each Sit to stand 2 x 5 SLS 2 x 30 sec each   PATIENT EDUCATION:  Education details: POC update, HEP update, TPDN Person educated: Patient Education method: Explanation, Demonstration, Tactile cues, Verbal cues, Handout Education comprehension: verbalized understanding, returned demonstration, verbal cues required,  tactile cues required, and needs further education   HOME EXERCISE PROGRAM: Access Code: [email or web address]      ASSESSMENT: CLINICAL IMPRESSION: Patient presents to PT with reports of knee and neck pain and states she has not recently been compliant with her HEP. She reports that the TPDN made her very sore the next day, but that it did help her pain overall. Session today focused on periscapular strengthening. Instructed in home use of theracane for trigger point release. Patient was able to tolerate all prescribed exercises with no adverse effects. Patient continues to benefit from skilled PT services and should be progressed as able to improve functional independence.    OBJECTIVE IMPAIRMENTS: Abnormal gait, decreased activity tolerance, decreased balance, decreased ROM, decreased strength, impaired flexibility, improper body mechanics, obesity, and pain.    ACTIVITY LIMITATIONS: sitting, standing, squatting, stairs, and locomotion level   PARTICIPATION LIMITATIONS: meal prep, cleaning, shopping, community activity, and occupation   PERSONAL FACTORS: Fitness, Past/current experiences, Profession, and Time since onset of injury/illness/exacerbation are also affecting patient's functional outcome.      GOALS: Goals reviewed with patient? Yes   SHORT TERM GOALS: Target date: 12/21/2022   Patient will be I with initial HEP in order to progress with therapy. Baseline: HEP provided at eval 10/19/2022: progressing 11/16/2022: progressing Goal status: PARTIALLY MET   2.  Patient will report pain level with work related tasks and stairs </= 6/10 in order to reduce functional limitations Baseline: 9-10/10 pain level 10/19/2022: 7/10 11/16/2022: 7/10 Goal status: PARTIALLY MET   3.  Patient will demonstrate proper squat (sit to stand) technique in order to indicate improved muscular control and reduce knee pain with household tasks and stairs Baseline: bilateral knee valgus, excessive forward trunk  lean and decreased depth due to pain 10/19/2022: bilateral knee valgus 11/16/2022: bilateral knee valgus Goal status: PARTIALLY MET  4. Patient will report a 50% improvement  in her left UE numbness and tingling symptoms to reduce functional limitations  Baseline: patrient reports daily numbness and tingling  Goal Status: INITIAL   LONG TERM GOALS: Target date: 01/18/2023    Patient will be I with final HEP to maintain progress from PT. Baseline: HEP provided at eval 11/16/2022: progressing Goal status: PARTIALLY MET   2.  Patient will report LEFS >/= 50/80 in order to indicate an improvement in her functional ability with activities including walking, stairs, and household tasks Baseline: 33/80 11/16/2022: 34/80 Goal status: PARTIALLY MET   3.  Patient will demonstrate bilateral knee strength 5/5 MMT to improve standing and walking tolerance as well as stair negotiation Baseline: grossly 4/5 MMT 11/16/2022: grossly 4/5 MMT Goal status: PARTIALLY MET   4.  Patient will report pain level with work related tasks and stairs </= 3/10 in order to reduce functional limitations Baseline: 9-10/10 pain level 11/16/2022: 7/10 Goal status: PARTIALLY MET   5. Patient will be able to maintain SLS >/= 15 sec bilaterally to indicate improve muscular control and single leg stability to reduce pain and improve stair negotiation. Baseline: unable to maintain SLS bilaterally 11/16/2022: right 28 seconds, left 7 seconds Goal status: PARTIALLY MET  6. Patient will demonstrate parascapular strength grossly >/= 4/5 MMT in order to improve postural control and reduce neck, shoulder, and arm pain.   Baseline: grossly 4-/5 MMT  Goal Status: INITIAL    7. Patient will report QuickDASH </= 20% disability in order to indicate improvement in her functional ability using the left UE for daily tasks  Baseline: 36.4%  Goal Status: INITIAL     PLAN: PT FREQUENCY: 1x/week   PT DURATION: 8 weeks    PLANNED INTERVENTIONS: Therapeutic exercises, Therapeutic activity, Neuromuscular re-education, Balance training, Gait training, Patient/Family education, Self Care, Joint mobilization, Joint manipulation, Aquatic Therapy, Dry Needling, Cryotherapy, Moist heat, Taping, Ionotophoresis 4mg /ml Dexamethasone, Manual therapy, and Re-evaluation   PLAN FOR NEXT SESSION: Review HEP and progress PRN, quad stretching as tolerated, progress knee and hip strengthening as able, progress to weight bearing exercises as tolerated, squat and step-up mechanics; TPDN/manual for left neck and posterior shoulder region, postural strengthening   Margarette Canada, PTA 12/14/22 5:48 PM

## 2022-12-20 NOTE — Therapy (Incomplete)
OUTPATIENT PHYSICAL THERAPY TREATMENT NOTE   Patient Name: Lacey Whitaker MRN: [id number] DOB:[birth year], 37 y.o., female Today's Date: 12/20/2022  PCP: Claudius Sis, PA  REFERRING PROVIDER: Claudius Sis, PA    END OF SESSION:          Past Medical History:  Diagnosis Date   Asthma    Cyst, mediastinum    pt. reports having the cyst removed last year and has reoccured   Diabetes mellitus type II, controlled ([place]) 01/27/2020   Iron deficiency anemia due to chronic blood loss    Morbid obesity ([place])    s/p gastric bypass surgery in 2009   Pregnancy induced hypertension    2006   Past Surgical History:  Procedure Laterality Date   BONE CYST EXCISION  2010   CESAREAN SECTION     CESAREAN SECTION  09/02/2011   Procedure: CESAREAN SECTION;  Surgeon: Delice Lesch, MD;  Location: [facility];  Service: Gynecology;  Laterality: N/A;   GASTRIC BYPASS  05/2008   GASTRIC BYPASS  2009   WISDOM TOOTH EXTRACTION  09/2010   Patient Active Problem List   Diagnosis Date Noted   Physical deconditioning 09/20/2020   History of COVID-19 09/20/2020   Pneumonia due to COVID-19 virus 08/16/2020   Shortness of breath 08/16/2020   Snoring 08/16/2020   COVID-19 08/01/2020   Acute respiratory failure with hypoxia (HCC)    Diabetes mellitus type II, controlled ([place]) 01/27/2020   Asthma, chronic    HTN (hypertension)    Iron deficiency anemia due to chronic blood loss    Obesities, morbid ([place]) 08/10/2011    REFERRING DIAG: Osteoarthritis of knee, unspecified; Radiculapthy, cervical region  THERAPY DIAG:  No diagnosis found.  Rationale for Evaluation and Treatment Rehabilitation  PERTINENT HISTORY: See PMH above   PRECAUTIONS: None   SUBJECTIVE:       SUBJECTIVE STATEMENT:  Patient reports that her shoulders and neck feel like they ache more   PAIN:  Are you having pain? Yes:  NPRS scale: 7/10 (9-10/10 with work related tasks) Pain location: Knee, right >  left Pain description: Sharp, achy, dull Aggravating factors: Standing, walking, stairs Relieving factors: Rest, prop leg up, self massage, ice/heat  NPRS scale: 5/10 Pain location: Neck, left shoulder and arm Pain description: Tingling, numbness Aggravating factors: Having bra on will aggravate Relieving factors: Rest, having bra off   OBJECTIVE: (objective measures completed at initial evaluation unless otherwise dated) PATIENT SURVEYS:  LEFS 33/80  11/16/2022: 34/80  QuickDASH: 36.4% disability - 11/23/2022    MUSCLE LENGTH: Quad limitation   POSTURE:             Bilateral knee valgus and pes planus   PALPATION: Tender lateral aspect of parapatellar region 11/23/2022: Exquisite tenderness noted left upper trap, rhomboid, and infraspinatus region, with reproduction of left UE symptoms with palpation of infraspinatus trigger points   LOWER EXTREMITY ROM:   Active ROM Right eval Left eval  Knee flexion 110 115  Knee extension 10+ hyper 10+ hyper   (Blank rows = not tested)   LOWER EXTREMITY MMT:   MMT Right eval Left eval Rt / Lt 10/19/22 Rt / Lt 11/16/2022  Hip flexion 4 4  4  / 4  Hip extension        Hip abduction 4- 4- 4- / 4- 4- / 4-  Knee flexion 4 4  4  / 4  Knee extension 4 4 4  / 4 4 / 4   (Blank rows =  not tested)   FUNCTIONAL TESTS:  Unable to maintain SLS bilaterally, trendelenburg when attempted  11/16/2022: right 28 seconds, left 7 seconds   Patient demonstrates squat pattern with bilateral knee valgus, excessive forward trunk lean and decreased depth due to pain 10/19/2022: bilateral knee valgus 11/16/2022: bilateral knee valgus   GAIT: Assistive device utilized: None Level of assistance: Complete Independence Comments: Bilateral knee valgus, toe out with excessive pronation, trendelenburg  CERVICAL ROM:   Active ROM A/PROM (deg) 11/23/2022  Flexion 60  Extension 45  Right lateral flexion 30  Left lateral flexion 30  Right rotation 60   Left rotation 70   (Blank rows = not tested)  UPPER EXTREMITY ROM:   UE AROM grossly WFL and equal bilaterally without pain  UPPER EXTREMITY MMT:  MMT Right 11/23/2022 Left 11/23/2022  Shoulder flexion 5 5  Shoulder extension 5 5  Shoulder abduction 5 5  Shoulder internal rotation 5 5  Shoulder external rotation 5 5  Middle trapezius 4- 4-  Lower trapezius 4- 4-  Elbow flexion 5 5  Elbow extension 5 5  Grip strength 50 lbs 50 lbs  (Blank rows = not tested)     Patient did exhibit greater effort on left with grip testing   SENSATION: Patient reports tingling/numbness of left forearm region, not specific dermatomal pattern    TODAY'S TREATMENT: [facility] Adult PT Treatment:                                                DATE: 12/21/2022 Therapeutic Exercise: Nustep level 6 x 5 mins UE/LE Seated horizontal abduction RTB 2x10 Seated diagonals x10 BIL High/low rows 25# 2x10 Lat pull down 25# 2x10 Pball roll up wall with alternating UE lift off x10 Seated double ER with scap retraction RTB 2x10 Self Care: Theracane self instruction   [facility] Adult PT Treatment:                                                DATE: 11/23/2022 Therapeutic Activity: Assessment of left UE and neck with objective measure for new and updated goals Therapeutic Exercise: SMFR using tennis ball against wall for left shoulder and neck region Seated double ER and scap retraction with green 2 x 10 Manual: Skilled palpation and monitoring of muscle tension while performing TPDN treatment STM / TPR for left upper trap and infraspinatus region Trigger Point Dry Needling Treatment: Pre-treatment instruction: Patient instructed on dry needling rationale, procedures, and possible side effects including pain during treatment (achy,cramping feeling), bruising, drop of blood, lightheadedness, nausea, sweating. Patient Consent Given: Yes Education handout provided: No Muscles treated: left upper trap, infraspinatus   Needle size and number: .30x22mm x 3 Electrical stimulation performed: No Parameters: N/A Treatment response/outcome: Twitch response elicited and Palpable decrease in muscle tension Post-treatment instructions: Patient instructed to expect possible mild to moderate muscle soreness later today and/or tomorrow. Patient instructed in methods to reduce muscle soreness and to continue prescribed HEP. If patient was dry needled over the lung field, patient was instructed on signs and symptoms of pneumothorax and, however unlikely, to see immediate medical attention should they occur. Patient was also educated on signs and symptoms of infection and to seek medical attention should they  occur. Patient verbalized understanding of these instructions and education.  [facility] Adult PT Treatment:                                                DATE: 11/16/2022 Therapeutic Exercise: NuStep L6 x 5 min with LE only while taking subjective LAQ with 3# 2 x 15 each SLR 2 x 10 each Bridge 2 x 10 Sidelying hip abduction 2 x 10 each Sit to stand 2 x 5 SLS 2 x 30 sec each  PATIENT EDUCATION:  Education details: HEP Person educated: Patient Education method: Consulting civil engineer, Demonstration, Corporate treasurer cues, Verbal cues, Handout Education comprehension: verbalized understanding, returned demonstration, verbal cues required, tactile cues required, and needs further education   HOME EXERCISE PROGRAM: Access Code: [email or web address]      ASSESSMENT: CLINICAL IMPRESSION: Patient tolerated therapy well with no adverse effects. *** Patient would benefit from continued skilled PT to progress her mobility, strength, and postural control in order to reduce pain and maximize functional ability.   Patient presents to PT with reports of knee and neck pain and states she has not recently been compliant with her HEP. She reports that the TPDN made her very sore the next day, but that it did help her pain overall. Session today focused on  periscapular strengthening. Instructed in home use of theracane for trigger point release. Patient was able to tolerate all prescribed exercises with no adverse effects. Patient continues to benefit from skilled PT services and should be progressed as able to improve functional independence.     OBJECTIVE IMPAIRMENTS: Abnormal gait, decreased activity tolerance, decreased balance, decreased ROM, decreased strength, impaired flexibility, improper body mechanics, obesity, and pain.    ACTIVITY LIMITATIONS: sitting, standing, squatting, stairs, and locomotion level   PARTICIPATION LIMITATIONS: meal prep, cleaning, shopping, community activity, and occupation   PERSONAL FACTORS: Fitness, Past/current experiences, Profession, and Time since onset of injury/illness/exacerbation are also affecting patient's functional outcome.      GOALS: Goals reviewed with patient? Yes   SHORT TERM GOALS: Target date: 12/21/2022   Patient will be I with initial HEP in order to progress with therapy. Baseline: HEP provided at eval 10/19/2022: progressing 11/16/2022: progressing Goal status: PARTIALLY MET   2.  Patient will report pain level with work related tasks and stairs </= 6/10 in order to reduce functional limitations Baseline: 9-10/10 pain level 10/19/2022: 7/10 11/16/2022: 7/10 Goal status: PARTIALLY MET   3.  Patient will demonstrate proper squat (sit to stand) technique in order to indicate improved muscular control and reduce knee pain with household tasks and stairs Baseline: bilateral knee valgus, excessive forward trunk lean and decreased depth due to pain 10/19/2022: bilateral knee valgus 11/16/2022: bilateral knee valgus Goal status: PARTIALLY MET  4. Patient will report a 50% improvement in her left UE numbness and tingling symptoms to reduce functional limitations  Baseline: patrient reports daily numbness and tingling  Goal Status: INITIAL   LONG TERM GOALS: Target date: 01/18/2023     Patient will be I with final HEP to maintain progress from PT. Baseline: HEP provided at eval 11/16/2022: progressing Goal status: PARTIALLY MET   2.  Patient will report LEFS >/= 50/80 in order to indicate an improvement in her functional ability with activities including walking, stairs, and household tasks Baseline: 33/80 11/16/2022: 34/80 Goal status: PARTIALLY MET   3.  Patient  will demonstrate bilateral knee strength 5/5 MMT to improve standing and walking tolerance as well as stair negotiation Baseline: grossly 4/5 MMT 11/16/2022: grossly 4/5 MMT Goal status: PARTIALLY MET   4.  Patient will report pain level with work related tasks and stairs </= 3/10 in order to reduce functional limitations Baseline: 9-10/10 pain level 11/16/2022: 7/10 Goal status: PARTIALLY MET   5. Patient will be able to maintain SLS >/= 15 sec bilaterally to indicate improve muscular control and single leg stability to reduce pain and improve stair negotiation. Baseline: unable to maintain SLS bilaterally 11/16/2022: right 28 seconds, left 7 seconds Goal status: PARTIALLY MET  6. Patient will demonstrate parascapular strength grossly >/= 4/5 MMT in order to improve postural control and reduce neck, shoulder, and arm pain.   Baseline: grossly 4-/5 MMT  Goal Status: INITIAL    7. Patient will report QuickDASH </= 20% disability in order to indicate improvement in her functional ability using the left UE for daily tasks  Baseline: 36.4%  Goal Status: INITIAL     PLAN: PT FREQUENCY: 1x/week   PT DURATION: 8 weeks   PLANNED INTERVENTIONS: Therapeutic exercises, Therapeutic activity, Neuromuscular re-education, Balance training, Gait training, Patient/Family education, Self Care, Joint mobilization, Joint manipulation, Aquatic Therapy, Dry Needling, Cryotherapy, Moist heat, Taping, Ionotophoresis 4mg /ml Dexamethasone, Manual therapy, and Re-evaluation   PLAN FOR NEXT SESSION: Review HEP and  progress PRN, quad stretching as tolerated, progress knee and hip strengthening as able, progress to weight bearing exercises as tolerated, squat and step-up mechanics; TPDN/manual for left neck and posterior shoulder region, postural strengthening   , PT, DPT, LAT, ATC 12/20/22  4:42 PM Phone: [phone] Fax: [phone]

## 2022-12-21 ENCOUNTER — Ambulatory Visit: Payer: Medicaid Other | Admitting: Physical Therapy

## 2022-12-26 ENCOUNTER — Other Ambulatory Visit: Payer: Self-pay | Admitting: Physician Assistant

## 2022-12-26 ENCOUNTER — Ambulatory Visit
Admission: RE | Admit: 2022-12-26 | Discharge: 2022-12-26 | Disposition: A | Discharge status: Home / Self Care | Payer: Medicaid Other | Source: Ambulatory Visit | Attending: Physician Assistant | Admitting: Physician Assistant

## 2022-12-26 DIAGNOSIS — M1711 Unilateral primary osteoarthritis, right knee: Secondary | ICD-10-CM

## 2022-12-26 DIAGNOSIS — M1712 Unilateral primary osteoarthritis, left knee: Secondary | ICD-10-CM

## 2022-12-28 ENCOUNTER — Ambulatory Visit: Payer: Medicaid Other | Attending: Medical | Admitting: Physical Therapy

## 2022-12-28 ENCOUNTER — Other Ambulatory Visit: Payer: Self-pay

## 2022-12-28 ENCOUNTER — Encounter: Payer: Self-pay | Admitting: Physical Therapy

## 2022-12-28 DIAGNOSIS — M6281 Muscle weakness (generalized): Secondary | ICD-10-CM | POA: Diagnosis present

## 2022-12-28 DIAGNOSIS — M25561 Pain in right knee: Secondary | ICD-10-CM | POA: Insufficient documentation

## 2022-12-28 DIAGNOSIS — G8929 Other chronic pain: Secondary | ICD-10-CM | POA: Diagnosis present

## 2022-12-28 DIAGNOSIS — M542 Cervicalgia: Secondary | ICD-10-CM | POA: Insufficient documentation

## 2022-12-28 DIAGNOSIS — M25562 Pain in left knee: Secondary | ICD-10-CM | POA: Insufficient documentation

## 2022-12-28 NOTE — Therapy (Signed)
OUTPATIENT PHYSICAL THERAPY TREATMENT NOTE   Patient Name: Lacey Whitaker MRN: [id number] DOB:November 19, 1986, 37 y.o., female Today's Date: 12/28/2022  PCP: Lew Dawes, PA  REFERRING PROVIDER: Lew Dawes, PA    END OF SESSION:   PT End of Session - 12/28/22 1637     Visit Number 8    Number of Visits 14    Date for PT Re-Evaluation 02/08/23    Authorization Type [employer]    Authorization Time Period 11/28/22 - 12/27/22    Authorization - Visit Number 1    Authorization - Number of Visits 4    PT Start Time 1615    PT Stop Time 1700    PT Time Calculation (min) 45 min    Activity Tolerance Patient tolerated treatment well    Behavior During Therapy [facility] for tasks assessed/performed                   Past Medical History:  Diagnosis Date   Asthma    Cyst, mediastinum    pt. reports having the cyst removed last year and has reoccured   Diabetes mellitus type II, controlled (HCC) 01/27/2020   Iron deficiency anemia due to chronic blood loss    Morbid obesity (HCC)    s/p gastric bypass surgery in 2009   Pregnancy induced hypertension    2006   Past Surgical History:  Procedure Laterality Date   BONE CYST EXCISION  2010   CESAREAN SECTION     CESAREAN SECTION  09/02/2011   Procedure: CESAREAN SECTION;  Surgeon: Purcell Nails, MD;  Location: [facility];  Service: Gynecology;  Laterality: N/A;   GASTRIC BYPASS  05/2008   GASTRIC BYPASS  2009   WISDOM TOOTH EXTRACTION  09/2010   Patient Active Problem List   Diagnosis Date Noted   Physical deconditioning 09/20/2020   History of COVID-19 09/20/2020   Pneumonia due to COVID-19 virus 08/16/2020   Shortness of breath 08/16/2020   Snoring 08/16/2020   COVID-19 08/01/2020   Acute respiratory failure with hypoxia (HCC)    Diabetes mellitus type II, controlled (HCC) 01/27/2020   Asthma, chronic    HTN (hypertension)    Iron deficiency anemia due to chronic blood loss    Obesities, morbid (HCC)  08/10/2011    REFERRING DIAG: Osteoarthritis of knee, unspecified; Radiculapthy, cervical region  THERAPY DIAG:  Cervicalgia  Chronic pain of right knee  Chronic pain of left knee  Muscle weakness (generalized)  Rationale for Evaluation and Treatment Rehabilitation  PERTINENT HISTORY: See PMH above   PRECAUTIONS: None   SUBJECTIVE:       SUBJECTIVE STATEMENT:  Patient reports neck still doing same thing, not as often as it used to be. She did have some x-rays for her knees yesterday. She has had work conflicts so has had to cancel recent appointments.  PAIN:  Are you having pain? Yes:  NPRS scale: 6/10 (9-10/10 with work related tasks) Pain location: Knee, right > left Pain description: Sharp, achy, dull Aggravating factors: Standing, walking, stairs Relieving factors: Rest, prop leg up, self massage, ice/heat  NPRS scale: 6/10 Pain location: Neck, left shoulder and arm Pain description: Tingling, numbness Aggravating factors: Having bra on will aggravate Relieving factors: Rest, having bra off   OBJECTIVE: (objective measures completed at initial evaluation unless otherwise dated) PATIENT SURVEYS:  LEFS 33/80  11/16/2022: 34/80  12/28/2022: 40/80  QuickDASH: 36.4% disability - 11/23/2022  12/28/2022: 36.4%    MUSCLE LENGTH: Quad limitation  POSTURE:             Bilateral knee valgus and pes planus   PALPATION: Tender lateral aspect of parapatellar region 11/23/2022: Exquisite tenderness noted left upper trap, rhomboid, and infraspinatus region, with reproduction of left UE symptoms with palpation of infraspinatus trigger points   LOWER EXTREMITY ROM:   Active ROM Right eval Left eval  Knee flexion 110 115  Knee extension 10+ hyper 10+ hyper   (Blank rows = not tested)   LOWER EXTREMITY MMT:   MMT Right eval Left eval Rt / Lt 10/19/22 Rt / Lt 11/16/2022 Rt / Lt 12/28/22  Hip flexion 4 4  4  / 4   Hip extension         Hip abduction 4- 4- 4- / 4- 4- /  4-   Knee flexion 4 4  4  / 4 4 / 4  Knee extension 4 4 4  / 4 4 / 4 4 / 4   (Blank rows = not tested)   FUNCTIONAL TESTS:  Unable to maintain SLS bilaterally, trendelenburg when attempted  11/16/2022: right 28 seconds, left 7 seconds 12/28/2022: right 30 seconds, left 10 seconds   Patient demonstrates squat pattern with bilateral knee valgus, excessive forward trunk lean and decreased depth due to pain 10/19/2022: bilateral knee valgus 11/16/2022: bilateral knee valgus 12/28/2022: bilateral knee valgus with toe out    GAIT: Assistive device utilized: None Level of assistance: Complete Independence Comments: Bilateral knee valgus, toe out with excessive pronation, trendelenburg  CERVICAL ROM:   Active ROM A/PROM (deg) 11/23/2022  Flexion 60  Extension 45  Right lateral flexion 30  Left lateral flexion 30  Right rotation 60  Left rotation 70   (Blank rows = not tested)  UPPER EXTREMITY ROM:   UE AROM grossly WFL and equal bilaterally without pain  UPPER EXTREMITY MMT:  MMT Right 11/23/2022 Left 11/23/2022 Rt / Lt 12/28/2022  Shoulder flexion 5 5   Shoulder extension 5 5   Shoulder abduction 5 5   Shoulder internal rotation 5 5   Shoulder external rotation 5 5   Middle trapezius 4- 4- 4- / 4-  Lower trapezius 4- 4- 4- / 4-  Elbow flexion 5 5   Elbow extension 5 5   Grip strength 50 lbs 50 lbs   (Blank rows = not tested)     Patient did exhibit greater effort on left with grip testing   SENSATION: Patient reports tingling/numbness of left forearm region, not specific dermatomal pattern    TODAY'S TREATMENT: [facility] Adult PT Treatment:                                                DATE: 12/28/2022 Therapeutic Exercise: UBE L1 x 4 min (fwd/bwd) while taking subjective Sidelying thoracic rotation x 10 each Supine chin tuck 2 x 10 Supine horizontal abduction with green 2 x 10 SLR 2 x 10 each Bridge 2 x 10 Sit to stand 2 x 10 Seated double ER and scap retraction with green  2 x 10 Manual: Skilled palpation and monitoring of muscle tension while performing TPDN treatment STM / TPR for left upper trap Trigger Point Dry Needling Treatment: Pre-treatment instruction: Patient instructed on dry needling rationale, procedures, and possible side effects including pain during treatment (achy,cramping feeling), bruising, drop of blood, lightheadedness, nausea, sweating. Patient Consent Given:  Yes Education handout provided: No Muscles treated: left upper trap Needle size and number: .30x84mm x 3 Electrical stimulation performed: No Parameters: N/A Treatment response/outcome: Twitch response elicited and Palpable decrease in muscle tension Post-treatment instructions: Patient instructed to expect possible mild to moderate muscle soreness later today and/or tomorrow. Patient instructed in methods to reduce muscle soreness and to continue prescribed HEP. If patient was dry needled over the lung field, patient was instructed on signs and symptoms of pneumothorax and, however unlikely, to see immediate medical attention should they occur. Patient was also educated on signs and symptoms of infection and to seek medical attention should they occur. Patient verbalized understanding of these instructions and education.   [facility] Adult PT Treatment:                                                DATE: 12/21/2022 Therapeutic Exercise: Nustep level 6 x 5 mins UE/LE Seated horizontal abduction RTB 2x10 Seated diagonals x10 BIL High/low rows 25# 2x10 Lat pull down 25# 2x10 Pball roll up wall with alternating UE lift off x10 Seated double ER with scap retraction RTB 2x10 Self Care: Theracane self instruction  [place] Adult PT Treatment:                                                DATE: 11/23/2022 Therapeutic Activity: Assessment of left UE and neck with objective measure for new and updated goals Therapeutic Exercise: SMFR using tennis ball against wall for left shoulder and neck  region Seated double ER and scap retraction with green 2 x 10 Manual: Skilled palpation and monitoring of muscle tension while performing TPDN treatment STM / TPR for left upper trap and infraspinatus region Trigger Point Dry Needling Treatment: Pre-treatment instruction: Patient instructed on dry needling rationale, procedures, and possible side effects including pain during treatment (achy,cramping feeling), bruising, drop of blood, lightheadedness, nausea, sweating. Patient Consent Given: Yes Education handout provided: No Muscles treated: left upper trap, infraspinatus  Needle size and number: .30x27mm x 3 Electrical stimulation performed: No Parameters: N/A Treatment response/outcome: Twitch response elicited and Palpable decrease in muscle tension Post-treatment instructions: Patient instructed to expect possible mild to moderate muscle soreness later today and/or tomorrow. Patient instructed in methods to reduce muscle soreness and to continue prescribed HEP. If patient was dry needled over the lung field, patient was instructed on signs and symptoms of pneumothorax and, however unlikely, to see immediate medical attention should they occur. Patient was also educated on signs and symptoms of infection and to seek medical attention should they occur. Patient verbalized understanding of these instructions and education.  PATIENT EDUCATION:  Education details: POC extension, HEP Person educated: Patient Education method: Explanation, Demonstration, Tactile cues, Verbal cues Education comprehension: verbalized understanding, returned demonstration, verbal cues required, tactile cues required, and needs further education   HOME EXERCISE PROGRAM: Access Code: [email or web address]      ASSESSMENT: CLINICAL IMPRESSION: Patient tolerated therapy well with no adverse effects. She has not been able to attend PT at a consistent basis due to work related conflicts so was only able to complete 1 visit in  her previous authorized period. She reports that her neck has improved in that she does not have the  left sided neck and arm tingling and pain as often. She reports her knee pain has improved some and she did just have new x-rays taken of her knees but has not heard results from her doctor. She reports improvement in her LEFS indicating improved functional ability regarding her knees, but does continue to report same on QuickDASH. She continues to exhibit gross strength deficits of the knees and postural musculature, but does continue to exhibit improvement with single leg stance for stability. Overall patient seems to be progressing well in therapy, and will be able to attend more regularly for future visits to continue her improvement. Patient would benefit from continued skilled PT to progress her mobility, strength, and postural control in order to reduce pain and maximize functional ability, so will extend PT POC for 6 more weeks at frequency of 1x/week.     OBJECTIVE IMPAIRMENTS: Abnormal gait, decreased activity tolerance, decreased balance, decreased ROM, decreased strength, impaired flexibility, improper body mechanics, obesity, and pain.    ACTIVITY LIMITATIONS: sitting, standing, squatting, stairs, and locomotion level   PARTICIPATION LIMITATIONS: meal prep, cleaning, shopping, community activity, and occupation   PERSONAL FACTORS: Fitness, Past/current experiences, Profession, and Time since onset of injury/illness/exacerbation are also affecting patient's functional outcome.      GOALS: Goals reviewed with patient? Yes   SHORT TERM GOALS: Target date: 01/18/2023    Patient will be I with initial HEP in order to progress with therapy. Baseline: HEP provided at eval 10/19/2022: progressing 11/16/2022: progressing 12/28/2022: independent with initial HEP Goal status: MET   2.  Patient will report pain level with work related tasks and stairs </= 6/10 in order to reduce functional  limitations Baseline: 9-10/10 pain level 10/19/2022: 7/10 11/16/2022: 7/10 12/28/2022: 6/10 Goal status: MET   3.  Patient will demonstrate proper squat (sit to stand) technique in order to indicate improved muscular control and reduce knee pain with household tasks and stairs Baseline: bilateral knee valgus, excessive forward trunk lean and decreased depth due to pain 10/19/2022: bilateral knee valgus 11/16/2022: bilateral knee valgus 12/28/2022: bilateral knee valgus with toe out Goal status: PARTIALLY MET  4. Patient will report a 50% improvement in her left UE numbness and tingling symptoms to reduce functional limitations  Baseline: patrient reports daily numbness and tingling 12/28/2022: patient reports about 25% improvement, pain not as frequent but pain still the same when occurs  Goal Status: PARTIALLY MET   LONG TERM GOALS: Target date: 02/08/2023   Patient will be I with final HEP to maintain progress from PT. Baseline: HEP provided at eval 11/16/2022: progressing 12/28/2022: progressing Goal status: PARTIALLY MET   2.  Patient will report LEFS >/= 50/80 in order to indicate an improvement in her functional ability with activities including walking, stairs, and household tasks Baseline: 33/80 11/16/2022: 34/80 12/28/2022: 40/80 Goal status: PARTIALLY MET   3.  Patient will demonstrate bilateral knee strength 5/5 MMT to improve standing and walking tolerance as well as stair negotiation Baseline: grossly 4/5 MMT 11/16/2022: grossly 4/5 MMT 12/28/2022: grossly 4/5 MMT Goal status: PARTIALLY MET   4.  Patient will report pain level with work related tasks and stairs </= 3/10 in order to reduce functional limitations Baseline: 9-10/10 pain level 11/16/2022: 7/10 12/28/2022: 6/10 Goal status: PARTIALLY MET   5. Patient will be able to maintain SLS >/= 15 sec bilaterally to indicate improve muscular control and single leg stability to reduce pain and improve stair  negotiation. Baseline: unable to maintain SLS bilaterally 11/16/2022: right  28 seconds, left 7 seconds 12/28/2022: right 30 seconds, left 10 seconds Goal status: PARTIALLY MET  6. Patient will demonstrate parascapular strength grossly >/= 4/5 MMT in order to improve postural control and reduce neck, shoulder, and arm pain.   Baseline: grossly 4-/5 MMT  12/28/2022: grossly 4-/5 MMT  Goal Status: PARTIALLY MET    7. Patient will report QuickDASH </= 20% disability in order to indicate improvement in her functional ability using the left UE for daily tasks  Baseline: 36.4% 12/28/2022: 36.4%  Goal Status: PARTIALLY MET     PLAN: PT FREQUENCY: 1x/week   PT DURATION: 6 weeks   PLANNED INTERVENTIONS: Therapeutic exercises, Therapeutic activity, Neuromuscular re-education, Balance training, Gait training, Patient/Family education, Self Care, Joint mobilization, Joint manipulation, Aquatic Therapy, Dry Needling, Cryotherapy, Moist heat, Taping, Ionotophoresis 4mg /ml Dexamethasone, Manual therapy, and Re-evaluation   PLAN FOR NEXT SESSION: Review HEP and progress PRN, quad stretching as tolerated, progress knee and hip strengthening as able, progress to weight bearing exercises as tolerated, squat and step-up mechanics; TPDN/manual for left neck and posterior shoulder region, postural strengthening   Hilda Blades, PT, DPT, LAT, ATC 12/28/22  5:05 PM Phone: [phone] Fax: [phone]

## 2023-01-04 ENCOUNTER — Other Ambulatory Visit: Payer: Self-pay

## 2023-01-04 ENCOUNTER — Encounter: Payer: Self-pay | Admitting: Physical Therapy

## 2023-01-04 ENCOUNTER — Ambulatory Visit: Payer: Medicaid Other | Admitting: Physical Therapy

## 2023-01-04 DIAGNOSIS — G8929 Other chronic pain: Secondary | ICD-10-CM

## 2023-01-04 DIAGNOSIS — M542 Cervicalgia: Secondary | ICD-10-CM | POA: Diagnosis not present

## 2023-01-04 DIAGNOSIS — M6281 Muscle weakness (generalized): Secondary | ICD-10-CM

## 2023-01-04 NOTE — Therapy (Signed)
OUTPATIENT PHYSICAL THERAPY TREATMENT NOTE   Patient Name: Lacey Whitaker MRN: [id number] DOB:[birth year], 37 y.o., female Today's Date: 01/04/2023  PCP: Claudius Sis, PA  REFERRING PROVIDER: Claudius Sis, PA    END OF SESSION:   PT End of Session - 01/04/23 1621     Visit Number 9    Number of Visits 14    Date for PT Re-Evaluation 02/08/23    Authorization Type [employer]    Authorization Time Period 12/28/2022 - 01/26/2023    Authorization - Visit Number 2    Authorization - Number of Visits 4    PT Start Time [id number]    PT Stop Time 1645   patient request to leave early   PT Time Calculation (min) 30 min    Activity Tolerance Patient tolerated treatment well    Behavior During Therapy [facility] for tasks assessed/performed                    Past Medical History:  Diagnosis Date   Asthma    Cyst, mediastinum    pt. reports having the cyst removed last year and has reoccured   Diabetes mellitus type II, controlled ([place]) 01/27/2020   Iron deficiency anemia due to chronic blood loss    Morbid obesity ([place])    s/p gastric bypass surgery in 2009   Pregnancy induced hypertension    2006   Past Surgical History:  Procedure Laterality Date   BONE CYST EXCISION  2010   CESAREAN SECTION     CESAREAN SECTION  09/02/2011   Procedure: CESAREAN SECTION;  Surgeon: Delice Lesch, MD;  Location: [facility];  Service: Gynecology;  Laterality: N/A;   GASTRIC BYPASS  05/2008   GASTRIC BYPASS  2009   WISDOM TOOTH EXTRACTION  09/2010   Patient Active Problem List   Diagnosis Date Noted   Physical deconditioning 09/20/2020   History of COVID-19 09/20/2020   Pneumonia due to COVID-19 virus 08/16/2020   Shortness of breath 08/16/2020   Snoring 08/16/2020   COVID-19 08/01/2020   Acute respiratory failure with hypoxia (HCC)    Diabetes mellitus type II, controlled ([place]) 01/27/2020   Asthma, chronic    HTN (hypertension)    Iron deficiency anemia due to chronic blood  loss    Obesities, morbid ([place]) 08/10/2011    REFERRING DIAG: Osteoarthritis of knee, unspecified; Radiculapthy, cervical region  THERAPY DIAG:  Cervicalgia  Chronic pain of right knee  Chronic pain of left knee  Muscle weakness (generalized)  Rationale for Evaluation and Treatment Rehabilitation  PERTINENT HISTORY: See PMH above   PRECAUTIONS: None   SUBJECTIVE:       SUBJECTIVE STATEMENT:  Patient reports her nerve pain has improved since her last visit.   PAIN:  Are you having pain? Yes:  NPRS scale: 6/10 (9-10/10 with work related tasks) Pain location: Knee, right > left Pain description: Sharp, achy, dull Aggravating factors: Standing, walking, stairs Relieving factors: Rest, prop leg up, self massage, ice/heat  NPRS scale: 2/10 Pain location: Neck, left shoulder and arm Pain description: Tingling, numbness Aggravating factors: Having bra on will aggravate Relieving factors: Rest, having bra off   OBJECTIVE: (objective measures completed at initial evaluation unless otherwise dated) PATIENT SURVEYS:  LEFS 33/80  11/16/2022: 34/80  12/28/2022: 40/80  QuickDASH: 36.4% disability - 11/23/2022  12/28/2022: 36.4%    MUSCLE LENGTH: Quad limitation   POSTURE:             Bilateral knee  valgus and pes planus   PALPATION: Tender lateral aspect of parapatellar region 11/23/2022: Exquisite tenderness noted left upper trap, rhomboid, and infraspinatus region, with reproduction of left UE symptoms with palpation of infraspinatus trigger points   LOWER EXTREMITY ROM:   Active ROM Right eval Left eval  Knee flexion 110 115  Knee extension 10+ hyper 10+ hyper   (Blank rows = not tested)   LOWER EXTREMITY MMT:   MMT Right eval Left eval Rt / Lt 10/19/22 Rt / Lt 11/16/2022 Rt / Lt 12/28/22  Hip flexion 4 4  4 $ / 4   Hip extension         Hip abduction 4- 4- 4- / 4- 4- / 4-   Knee flexion 4 4  4 $ / 4 4 / 4  Knee extension 4 4 4 $ / 4 4 / 4 4 / 4   (Blank rows =  not tested)   FUNCTIONAL TESTS:  Unable to maintain SLS bilaterally, trendelenburg when attempted  11/16/2022: right 28 seconds, left 7 seconds 12/28/2022: right 30 seconds, left 10 seconds   Patient demonstrates squat pattern with bilateral knee valgus, excessive forward trunk lean and decreased depth due to pain 10/19/2022: bilateral knee valgus 11/16/2022: bilateral knee valgus 12/28/2022: bilateral knee valgus with toe out    GAIT: Assistive device utilized: None Level of assistance: Complete Independence Comments: Bilateral knee valgus, toe out with excessive pronation, trendelenburg  CERVICAL ROM:   Active ROM A/PROM (deg) 11/23/2022  Flexion 60  Extension 45  Right lateral flexion 30  Left lateral flexion 30  Right rotation 60  Left rotation 70   (Blank rows = not tested)  UPPER EXTREMITY ROM:   UE AROM grossly WFL and equal bilaterally without pain  UPPER EXTREMITY MMT:  MMT Right 11/23/2022 Left 11/23/2022 Rt / Lt 12/28/2022 Rt / Lt 01/04/2023  Shoulder flexion 5 5    Shoulder extension 5 5    Shoulder abduction 5 5    Shoulder internal rotation 5 5    Shoulder external rotation 5 5    Middle trapezius 4- 4- 4- / 4- 4- / 4-  Lower trapezius 4- 4- 4- / 4-   Elbow flexion 5 5    Elbow extension 5 5    Grip strength 50 lbs 50 lbs    (Blank rows = not tested)     Patient did exhibit greater effort on left with grip testing   SENSATION: Patient reports tingling/numbness of left forearm region, not specific dermatomal pattern    TODAY'S TREATMENT: [facility] Adult PT Treatment:                                                DATE: 01/04/2023 Therapeutic Exercise: UBE L2 x 4 min (fwd/bwd) while taking subjective Sidelying thoracic rotation x 10 each Supine horizontal abduction with blue 2 x 10 SLR 2 x 10 each Seated physioball roll-out 5 x 10 sec hold Seated double ER and scap retraction with blue 2 x 10 Row with green 2 x 10 Extension with red x 10   [facility] Adult PT  Treatment:  DATE: 12/28/2022 Therapeutic Exercise: UBE L1 x 4 min (fwd/bwd) while taking subjective Sidelying thoracic rotation x 10 each Supine chin tuck 2 x 10 Supine horizontal abduction with green 2 x 10 SLR 2 x 10 each Bridge 2 x 10 Sit to stand 2 x 10 Seated double ER and scap retraction with green 2 x 10 Manual: Skilled palpation and monitoring of muscle tension while performing TPDN treatment STM / TPR for left upper trap Trigger Point Dry Needling Treatment: Pre-treatment instruction: Patient instructed on dry needling rationale, procedures, and possible side effects including pain during treatment (achy,cramping feeling), bruising, drop of blood, lightheadedness, nausea, sweating. Patient Consent Given: Yes Education handout provided: No Muscles treated: left upper trap Needle size and number: .30x31m x 3 Electrical stimulation performed: No Parameters: N/A Treatment response/outcome: Twitch response elicited and Palpable decrease in muscle tension Post-treatment instructions: Patient instructed to expect possible mild to moderate muscle soreness later today and/or tomorrow. Patient instructed in methods to reduce muscle soreness and to continue prescribed HEP. If patient was dry needled over the lung field, patient was instructed on signs and symptoms of pneumothorax and, however unlikely, to see immediate medical attention should they occur. Patient was also educated on signs and symptoms of infection and to seek medical attention should they occur. Patient verbalized understanding of these instructions and education.  [facility]                                                [facility]: [facility] Therapeutic Exercise: Nustep level 6 x 5 mins UE/LE Seated horizontal abduction RTB 2x10 Seated diagonals x10 BIL High/low rows 25# 2x10 Lat pull down 25# 2x10 Pball roll up wall with alternating UE lift off x10 Seated double ER  with scap retraction RTB 2x10 Self Care: Theracane self instruction  PATIENT EDUCATION:  Education details: HEP Person educated: Patient Education method: Explanation, Demonstration, [employer] treasurercues, Verbal cues Education comprehension: verbalized understanding, returned demonstration, verbal cues required, tactile cues required, and needs further education   HOME EXERCISE PROGRAM: Access Code: [email or web address]      ASSESSMENT: CLINICAL IMPRESSION: Patient tolerated therapy well with no adverse effects. Therapy shortened due to patient request to leave early. She reports improvement in her neck/nerve symptoms this visit. Therapy continues to focus primarily on postural strengthening and control, and progressing knee strength. Progressed resistance for exercises this visit. She does continue to exhibit gross strength deficit of periscapular muscles and requires cueing for posture with exercises, to avoid shrug. No changes to HEP but patient was provided blue band. Patient would benefit from continued skilled PT to progress her mobility, strength, and postural control in order to reduce pain and maximize functional ability.    OBJECTIVE IMPAIRMENTS: Abnormal gait, decreased activity tolerance, decreased balance, decreased ROM, decreased strength, impaired flexibility, improper body mechanics, obesity, and pain.    ACTIVITY LIMITATIONS: sitting, standing, squatting, stairs, and locomotion level   PARTICIPATION LIMITATIONS: meal prep, cleaning, shopping, community activity, and occupation   PERSONAL FACTORS: Fitness, Past/current experiences, Profession, and Time since onset of injury/illness/exacerbation are also affecting patient's functional outcome.      GOALS: Goals reviewed with patient? Yes   SHORT TERM GOALS: Target date: 01/18/2023    Patient will be I with initial HEP in order to progress with therapy. Baseline: HEP provided at eval 10/19/2022: progressing 11/16/2022:  progressing 12/28/2022:  independent with initial HEP Goal status: MET   2.  Patient will report pain level with work related tasks and stairs </= 6/10 in order to reduce functional limitations Baseline: 9-10/10 pain level 10/19/2022: 7/10 11/16/2022: 7/10 12/28/2022: 6/10 Goal status: MET   3.  Patient will demonstrate proper squat (sit to stand) technique in order to indicate improved muscular control and reduce knee pain with household tasks and stairs Baseline: bilateral knee valgus, excessive forward trunk lean and decreased depth due to pain 10/19/2022: bilateral knee valgus 11/16/2022: bilateral knee valgus 12/28/2022: bilateral knee valgus with toe out Goal status: PARTIALLY MET  4. Patient will report a 50% improvement in her left UE numbness and tingling symptoms to reduce functional limitations  Baseline: patrient reports daily numbness and tingling 12/28/2022: patient reports about 25% improvement, pain not as frequent but pain still the same when occurs  Goal Status: PARTIALLY MET   LONG TERM GOALS: Target date: 02/08/2023   Patient will be I with final HEP to maintain progress from PT. Baseline: HEP provided at eval 11/16/2022: progressing 12/28/2022: progressing Goal status: PARTIALLY MET   2.  Patient will report LEFS >/= 50/80 in order to indicate an improvement in her functional ability with activities including walking, stairs, and household tasks Baseline: 33/80 11/16/2022: 34/80 12/28/2022: 40/80 Goal status: PARTIALLY MET   3.  Patient will demonstrate bilateral knee strength 5/5 MMT to improve standing and walking tolerance as well as stair negotiation Baseline: grossly 4/5 MMT 11/16/2022: grossly 4/5 MMT 12/28/2022: grossly 4/5 MMT Goal status: PARTIALLY MET   4.  Patient will report pain level with work related tasks and stairs </= 3/10 in order to reduce functional limitations Baseline: 9-10/10 pain level 11/16/2022: 7/10 12/28/2022: 6/10 Goal status: PARTIALLY  MET   5. Patient will be able to maintain SLS >/= 15 sec bilaterally to indicate improve muscular control and single leg stability to reduce pain and improve stair negotiation. Baseline: unable to maintain SLS bilaterally 11/16/2022: right 28 seconds, left 7 seconds 12/28/2022: right 30 seconds, left 10 seconds Goal status: PARTIALLY MET  6. Patient will demonstrate parascapular strength grossly >/= 4/5 MMT in order to improve postural control and reduce neck, shoulder, and arm pain.   Baseline: grossly 4-/5 MMT  12/28/2022: grossly 4-/5 MMT  Goal Status: PARTIALLY MET    7. Patient will report QuickDASH </= 20% disability in order to indicate improvement in her functional ability using the left UE for daily tasks  Baseline: 36.4% 12/28/2022: 36.4%  Goal Status: PARTIALLY MET     PLAN: PT FREQUENCY: 1x/week   PT DURATION: 6 weeks   PLANNED INTERVENTIONS: Therapeutic exercises, Therapeutic activity, Neuromuscular re-education, Balance training, Gait training, Patient/Family education, Self Care, Joint mobilization, Joint manipulation, Aquatic Therapy, Dry Needling, Cryotherapy, Moist heat, Taping, Ionotophoresis 24m/ml Dexamethasone, Manual therapy, and Re-evaluation   PLAN FOR NEXT SESSION: Review HEP and progress PRN, quad stretching as tolerated, progress knee and hip strengthening as able, progress to weight bearing exercises as tolerated, squat and step-up mechanics; TPDN/manual for left neck and posterior shoulder region, postural strengthening   CHilda Blades PT, DPT, LAT, ATC 01/04/23  4:50 PM Phone: [phone]: [phone]

## 2023-01-18 ENCOUNTER — Ambulatory Visit: Payer: Medicaid Other

## 2023-01-25 ENCOUNTER — Ambulatory Visit: Payer: Medicaid Other | Admitting: Physical Therapy

## 2023-02-01 ENCOUNTER — Other Ambulatory Visit: Payer: Self-pay

## 2023-02-01 ENCOUNTER — Encounter: Payer: Self-pay | Admitting: Physical Therapy

## 2023-02-01 ENCOUNTER — Ambulatory Visit: Payer: Medicaid Other | Attending: Medical | Admitting: Physical Therapy

## 2023-02-01 DIAGNOSIS — M542 Cervicalgia: Secondary | ICD-10-CM | POA: Diagnosis present

## 2023-02-01 DIAGNOSIS — M25562 Pain in left knee: Secondary | ICD-10-CM | POA: Diagnosis present

## 2023-02-01 DIAGNOSIS — M25561 Pain in right knee: Secondary | ICD-10-CM | POA: Insufficient documentation

## 2023-02-01 DIAGNOSIS — M6281 Muscle weakness (generalized): Secondary | ICD-10-CM | POA: Diagnosis present

## 2023-02-01 DIAGNOSIS — G8929 Other chronic pain: Secondary | ICD-10-CM | POA: Diagnosis present

## 2023-02-01 NOTE — Patient Instructions (Signed)
Access Code: [email or web address] URL: [email or web address] Date: 02/01/2023 Prepared by: Hilda Blades  Exercises - Active Straight Leg Raise with Quad Set  - 1 x daily - 3 sets - 10 reps - Bridge  - 1 x daily - 3 sets - 10 reps - Sidelying Hip Abduction  - 1 x daily - 3 sets - 10 reps - Seated Knee Extension with Resistance  - 1 x daily - 3 sets - 10 reps - Sit to Stand Without Arm Support  - 1 x daily - 3 sets - 5 reps - Standing Single Leg Stance with Counter Support  - 1 x daily - 3 sets - 30 seconds hold - Standing Upper Trapezius Mobilization with Small Ball  - Shoulder External Rotation and Scapular Retraction with Resistance  - 1 x daily - 3 sets - 10 reps - Standing Row with Anchored Resistance  - 1 x daily - 3 sets - 10 reps

## 2023-02-01 NOTE — Therapy (Addendum)
OUTPATIENT PHYSICAL THERAPY TREATMENT NOTE  DISCHARGE   Progress Note Reporting Period 09/22/2023 to 02/01/2023  See note below for Objective Data and Assessment of Progress/Goals.    Patient Name: Lacey Whitaker MRN: [id number] DOB:1985/12/26, 37 y.o., female Today's Date: 02/02/2023  PCP: Lew Dawes, PA  REFERRING PROVIDER: Lew Dawes, PA    END OF SESSION:   PT End of Session - 02/01/23 1626     Visit Number 10    Number of Visits 16    Date for PT Re-Evaluation 03/16/23    Authorization Type [employer]    Authorization Time Period pending on 02/01/23    PT Start Time 1615    PT Stop Time 1700    PT Time Calculation (min) 45 min    Activity Tolerance Patient tolerated treatment well    Behavior During Therapy [facility] for tasks assessed/performed                     Past Medical History:  Diagnosis Date   Asthma    Cyst, mediastinum    pt. reports having the cyst removed last year and has reoccured   Diabetes mellitus type II, controlled (HCC) 01/27/2020   Iron deficiency anemia due to chronic blood loss    Morbid obesity (HCC)    s/p gastric bypass surgery in 2009   Pregnancy induced hypertension    2006   Past Surgical History:  Procedure Laterality Date   BONE CYST EXCISION  2010   CESAREAN SECTION     CESAREAN SECTION  09/02/2011   Procedure: CESAREAN SECTION;  Surgeon: Purcell Nails, MD;  Location: [facility];  Service: Gynecology;  Laterality: N/A;   GASTRIC BYPASS  05/2008   GASTRIC BYPASS  2009   WISDOM TOOTH EXTRACTION  09/2010   Patient Active Problem List   Diagnosis Date Noted   Physical deconditioning 09/20/2020   History of COVID-19 09/20/2020   Pneumonia due to COVID-19 virus 08/16/2020   Shortness of breath 08/16/2020   Snoring 08/16/2020   COVID-19 08/01/2020   Acute respiratory failure with hypoxia (HCC)    Diabetes mellitus type II, controlled (HCC) 01/27/2020   Asthma, chronic    HTN (hypertension)     Iron deficiency anemia due to chronic blood loss    Obesities, morbid (HCC) 08/10/2011    REFERRING DIAG: Osteoarthritis of knee, unspecified; Radiculapthy, cervical region  THERAPY DIAG:  Cervicalgia  Chronic pain of right knee  Chronic pain of left knee  Muscle weakness (generalized)  Rationale for Evaluation and Treatment Rehabilitation  PERTINENT HISTORY: See PMH above   PRECAUTIONS: None   SUBJECTIVE:       SUBJECTIVE STATEMENT:  Patient reports she has had to miss the past few appointments due to attending funerals out of the state. She has been trying to do her exercises but is not doing them every day. She reports increase in left neck and shoulder pain since she has not been in therapy and has a lot of stress dealing with deaths in the family and having to do more traveling. She also states her knees have been feeling pretty bad as well. She did have to drive 5-6 hours twice and that really aggravated her knees. She reports that the needling has really helped her neck and shoulder in the past.  PAIN:  Are you having pain? Yes:  NPRS scale: 8-9/10 (9-10/10 with work related tasks) Pain location: Knee, right > left Pain description: Sharp, achy, dull  Aggravating factors: Standing, walking, stairs Relieving factors: Rest, prop leg up, self massage, ice/heat  NPRS scale: 7/10 Pain location: Neck, left shoulder and arm Pain description: Tingling, numbness Aggravating factors: Having bra on will aggravate Relieving factors: Rest, having bra off   OBJECTIVE: (objective measures completed at initial evaluation unless otherwise dated) PATIENT SURVEYS:  LEFS 33/80  11/16/2022: 34/80  12/28/2022: 40/80  02/01/2023: 42/80  QuickDASH: 36.4% disability - 11/23/2022  12/28/2022: 36.4%  02/01/2023: 34.1%    MUSCLE LENGTH: Quad limitation   POSTURE:             Bilateral knee valgus and pes planus   PALPATION: Tender lateral aspect of parapatellar region 11/23/2022: Exquisite  tenderness noted left upper trap, rhomboid, and infraspinatus region, with reproduction of left UE symptoms with palpation of infraspinatus trigger points   LOWER EXTREMITY ROM:   Active ROM Right eval Left eval  Knee flexion 110 115  Knee extension 10+ hyper 10+ hyper   (Blank rows = not tested)   LOWER EXTREMITY MMT:   MMT Right eval Left eval Rt / Lt 10/19/22 Rt / Lt 11/16/22 Rt / Lt 12/28/22 Rt / Lt 02/01/23  Hip flexion 4 4  4  / 4    Hip extension          Hip abduction 4- 4- 4- / 4- 4- / 4-    Knee flexion 4 4  4  / 4 4 / 4 4 / 4  Knee extension 4 4 4  / 4 4 / 4 4 / 4 4 / 4   (Blank rows = not tested)   FUNCTIONAL TESTS:  Unable to maintain SLS bilaterally, trendelenburg when attempted  11/16/2022: right 28 seconds, left 7 seconds 12/28/2022: right 30 seconds, left 10 seconds   Patient demonstrates squat pattern with bilateral knee valgus, excessive forward trunk lean and decreased depth due to pain 10/19/2022: bilateral knee valgus 11/16/2022: bilateral knee valgus 12/28/2022: bilateral knee valgus with toe out    GAIT: Assistive device utilized: None Level of assistance: Complete Independence Comments: Bilateral knee valgus, toe out with excessive pronation, trendelenburg  CERVICAL ROM:   Active ROM A/PROM (deg) 11/23/2022   02/01/2023  Flexion 60 60  Extension 45 50  Right lateral flexion 30 40  Left lateral flexion 30 45  Right rotation 60 70  Left rotation 70 70   (Blank rows = not tested)  UPPER EXTREMITY ROM:   UE AROM grossly WFL and equal bilaterally without pain  UPPER EXTREMITY MMT:  MMT Right 11/23/2022 Left 11/23/2022 Rt / Lt 12/28/2022 Rt / Lt 01/04/2023 Rt / Lt 02/01/2023  Shoulder flexion 5 5     Shoulder extension 5 5     Shoulder abduction 5 5     Shoulder internal rotation 5 5     Shoulder external rotation 5 5     Middle trapezius 4- 4- 4- / 4- 4- / 4- 4- / 4-  Lower trapezius 4- 4- 4- / 4-    Elbow flexion 5 5     Elbow extension 5 5      Grip strength 50 lbs 50 lbs     (Blank rows = not tested)     Patient did exhibit greater effort on left with grip testing   SENSATION: Patient reports tingling/numbness of left forearm region, not specific dermatomal pattern    TODAY'S TREATMENT: [facility] Adult PT Treatment:  DATE: 02/01/2023 Therapeutic Exercise: UBE L2 x 4 min (fwd/bwd) while taking subjective Seated upper trap stretch 3 x 15 sec each Sidelying thoracic rotation x 10 each Supine chin tuck 2 x 10 with towel roll under neck Supine horizontal abduction with blue 2 x 10 Seated physioball roll-out 5 x 10 sec hold Seated double ER and scap retraction with blue 2 x 10 Row with blue 2 x 10 Extension with green x 10 Manual: Skilled palpation and monitoring of muscle tension while performing TPDN treatment STM / TPR for left upper trap and infraspinatus Trigger Point Dry Needling Treatment: Pre-treatment instruction: Patient instructed on dry needling rationale, procedures, and possible side effects including pain during treatment (achy,cramping feeling), bruising, drop of blood, lightheadedness, nausea, sweating. Patient Consent Given: Yes Education handout provided: No Muscles treated: left upper trap and infraspinatus Needle size and number: .30x16mm x 3 Electrical stimulation performed: No Parameters: N/A Treatment response/outcome: Twitch response elicited and Palpable decrease in muscle tension Post-treatment instructions: Patient instructed to expect possible mild to moderate muscle soreness later today and/or tomorrow. Patient instructed in methods to reduce muscle soreness and to continue prescribed HEP. If patient was dry needled over the lung field, patient was instructed on signs and symptoms of pneumothorax and, however unlikely, to see immediate medical attention should they occur. Patient was also educated on signs and symptoms of infection and to seek medical  attention should they occur. Patient verbalized understanding of these instructions and education.   [facility] Adult PT Treatment:                                                DATE: 01/04/2023 Therapeutic Exercise: UBE L2 x 4 min (fwd/bwd) while taking subjective Sidelying thoracic rotation x 10 each Supine horizontal abduction with blue 2 x 10 SLR 2 x 10 each Seated physioball roll-out 5 x 10 sec hold Seated double ER and scap retraction with blue 2 x 10 Row with green 2 x 10 Extension with red x 10  [facility] Adult PT Treatment:                                                DATE: 12/28/2022 Therapeutic Exercise: UBE L1 x 4 min (fwd/bwd) while taking subjective Sidelying thoracic rotation x 10 each Supine chin tuck 2 x 10 Supine horizontal abduction with green 2 x 10 SLR 2 x 10 each Bridge 2 x 10 Sit to stand 2 x 10 Seated double ER and scap retraction with green 2 x 10 Manual: Skilled palpation and monitoring of muscle tension while performing TPDN treatment STM / TPR for left upper trap Trigger Point Dry Needling Treatment: Pre-treatment instruction: Patient instructed on dry needling rationale, procedures, and possible side effects including pain during treatment (achy,cramping feeling), bruising, drop of blood, lightheadedness, nausea, sweating. Patient Consent Given: Yes Education handout provided: No Muscles treated: left upper trap Needle size and number: .30x68mm x 3 Electrical stimulation performed: No Parameters: N/A Treatment response/outcome: Twitch response elicited and Palpable decrease in muscle tension Post-treatment instructions: Patient instructed to expect possible mild to moderate muscle soreness later today and/or tomorrow. Patient instructed in methods to reduce muscle soreness and to continue prescribed HEP. If patient was dry needled  over the lung field, patient was instructed on signs and symptoms of pneumothorax and, however unlikely, to see immediate medical  attention should they occur. Patient was also educated on signs and symptoms of infection and to seek medical attention should they occur. Patient verbalized understanding of these instructions and education.  PATIENT EDUCATION:  Education details: POC extension, HEP update, TPDN Person educated: Patient Education method: Explanation, Demonstration, Tactile cues, Verbal cues, Handout Education comprehension: verbalized understanding, returned demonstration, verbal cues required, tactile cues required, and needs further education   HOME EXERCISE PROGRAM: Access Code: [email or web address]      ASSESSMENT: CLINICAL IMPRESSION: Patient tolerated therapy well with no adverse effects. She returns after a gap in care due to a death in her family, and she reports that her symptoms have worsened since last appointment due to extensive traveling and stress. Prior to her recent absence in therapy, she was demonstrating improvement in her left neck and shoulder/arm pain. She does report an improvement in her functional level regarding both the neck and knee on QuickDASH and LEFS, and demonstrates improvement in her neck motion this visit. She continues to exhibit gross strength deficits of the periscapular/postural musculature and knees. Therapy continues to utilized TPDN to reduce pain and tension of left neck and shoulder, and primarily focused on progressing postural strength and control this visit with good tolerance. Updated her HEP this visit to progress postural strengthening. She did tolerate increased band resistance with periscapular strengthening. Patient would benefit from continued skilled PT to progress her mobility, strength, and postural control in order to reduce pain and maximize functional ability, so will extend PT POC for 6 more weeks.    OBJECTIVE IMPAIRMENTS: Abnormal gait, decreased activity tolerance, decreased balance, decreased ROM, decreased strength, impaired flexibility, improper body  mechanics, obesity, and pain.    ACTIVITY LIMITATIONS: sitting, standing, squatting, stairs, and locomotion level   PARTICIPATION LIMITATIONS: meal prep, cleaning, shopping, community activity, and occupation   PERSONAL FACTORS: Fitness, Past/current experiences, Profession, and Time since onset of injury/illness/exacerbation are also affecting patient's functional outcome.      GOALS: Goals reviewed with patient? Yes   SHORT TERM GOALS: Target date: 01/18/2023    Patient will be I with initial HEP in order to progress with therapy. Baseline: HEP provided at eval 10/19/2022: progressing 11/16/2022: progressing 12/28/2022: independent with initial HEP Goal status: MET   2.  Patient will report pain level with work related tasks and stairs </= 6/10 in order to reduce functional limitations Baseline: 9-10/10 pain level 10/19/2022: 7/10 11/16/2022: 7/10 12/28/2022: 6/10 Goal status: MET   3.  Patient will demonstrate proper squat (sit to stand) technique in order to indicate improved muscular control and reduce knee pain with household tasks and stairs Baseline: bilateral knee valgus, excessive forward trunk lean and decreased depth due to pain 10/19/2022: bilateral knee valgus 11/16/2022: bilateral knee valgus 12/28/2022: bilateral knee valgus with toe out Goal status: PARTIALLY MET  4. Patient will report a 50% improvement in her left UE numbness and tingling symptoms to reduce functional limitations  Baseline: patrient reports daily numbness and tingling 12/28/2022: patient reports about 25% improvement, pain not as frequent but pain still the same when occurs 02/01/2023: patient reports about 25% improvement, pain not as frequent but pain still the same when occurs  Goal Status: PARTIALLY MET   LONG TERM GOALS: Target date: 03/16/2023   Patient will be I with final HEP to maintain progress from PT. Baseline: HEP provided at eval 11/16/2022: progressing 12/28/2022:  progressing 02/01/2023: progressing Goal status: PARTIALLY MET   2.  Patient will report LEFS >/= 50/80 in order to indicate an improvement in her functional ability with activities including walking, stairs, and household tasks Baseline: 33/80 11/16/2022: 34/80 12/28/2022: 40/80 02/01/2023: 42/80 Goal status: PARTIALLY MET   3.  Patient will demonstrate bilateral knee strength 5/5 MMT to improve standing and walking tolerance as well as stair negotiation Baseline: grossly 4/5 MMT 11/16/2022: grossly 4/5 MMT 12/28/2022: grossly 4/5 MMT 02/01/2023: grossly 4/5 MMT Goal status: PARTIALLY MET   4.  Patient will report pain level with work related tasks and stairs </= 3/10 in order to reduce functional limitations Baseline: 9-10/10 pain level 11/16/2022: 7/10 12/28/2022: 6/10 02/01/2023: continues to report high levels of pain Goal status: PARTIALLY MET   5. Patient will be able to maintain SLS >/= 15 sec bilaterally to indicate improve muscular control and single leg stability to reduce pain and improve stair negotiation. Baseline: unable to maintain SLS bilaterally 11/16/2022: right 28 seconds, left 7 seconds 12/28/2022: right 30 seconds, left 10 seconds Goal status: PARTIALLY MET  6. Patient will demonstrate parascapular strength grossly >/= 4/5 MMT in order to improve postural control and reduce neck, shoulder, and arm pain.   Baseline: grossly 4-/5 MMT  12/28/2022: grossly 4-/5 MMT  02/01/2023: grossly 4-/5 MMT  Goal Status: PARTIALLY MET    7. Patient will report QuickDASH </= 20% disability in order to indicate improvement in her functional ability using the left UE for daily tasks  Baseline: 36.4% 12/28/2022: 36.4% 02/01/2023: 34.1%  Goal Status: PARTIALLY MET     PLAN: PT FREQUENCY: 1x/week   PT DURATION: 6 weeks   PLANNED INTERVENTIONS: Therapeutic exercises, Therapeutic activity, Neuromuscular re-education, Balance training, Gait training, Patient/Family education, Self Care,  Joint mobilization, Joint manipulation, Aquatic Therapy, Dry Needling, Cryotherapy, Moist heat, Taping, Ionotophoresis 4mg /ml Dexamethasone, Manual therapy, and Re-evaluation   PLAN FOR NEXT SESSION: Review HEP and progress PRN, progress knee and hip strengthening as able, progress to weight bearing exercises as tolerated, squat and step-up mechanics; TPDN/manual for left neck and posterior shoulder region, postural strengthening   Rosana Hoes, PT, DPT, LAT, ATC 02/02/23  7:26 AM Phone: [phone] Fax: [phone]   PHYSICAL THERAPY DISCHARGE SUMMARY  Visits from Start of Care: 10  Current functional level related to goals / functional outcomes: See above   Remaining deficits: See above   Education / Equipment: HEP   Patient agrees to discharge. Patient goals were not met. Patient is being discharged due to not returning since the last visit.  Rosana Hoes, PT, DPT, LAT, ATC 03/22/23  11:16 AM Phone: [phone] Fax: [phone]

## 2023-02-15 ENCOUNTER — Ambulatory Visit: Payer: Medicaid Other | Admitting: Physical Therapy

## 2023-02-22 ENCOUNTER — Ambulatory Visit: Payer: Medicaid Other | Admitting: Physical Therapy

## 2023-02-22 ENCOUNTER — Encounter: Payer: Self-pay | Admitting: Physical Therapy

## 2023-03-01 ENCOUNTER — Encounter: Payer: Medicaid Other | Admitting: Physical Therapy

## 2023-03-08 ENCOUNTER — Encounter: Payer: Medicaid Other | Admitting: Physical Therapy

## 2023-09-13 ENCOUNTER — Ambulatory Visit (HOSPITAL_COMMUNITY)
Admission: EM | Admit: 2023-09-13 | Discharge: 2023-09-13 | Disposition: A | Discharge status: Home / Self Care | Payer: Medicaid Other | Attending: Physician Assistant | Admitting: Physician Assistant

## 2023-09-13 ENCOUNTER — Encounter (HOSPITAL_COMMUNITY): Payer: Self-pay

## 2023-09-13 DIAGNOSIS — R52 Pain, unspecified: Secondary | ICD-10-CM | POA: Insufficient documentation

## 2023-09-13 DIAGNOSIS — Z1152 Encounter for screening for COVID-19: Secondary | ICD-10-CM | POA: Insufficient documentation

## 2023-09-13 DIAGNOSIS — Z8709 Personal history of other diseases of the respiratory system: Secondary | ICD-10-CM | POA: Diagnosis not present

## 2023-09-13 DIAGNOSIS — J069 Acute upper respiratory infection, unspecified: Secondary | ICD-10-CM | POA: Insufficient documentation

## 2023-09-13 DIAGNOSIS — R051 Acute cough: Secondary | ICD-10-CM | POA: Diagnosis present

## 2023-09-13 LAB — BASIC METABOLIC PANEL
Anion gap: 10 (ref 5–15)
BUN: <5 mg/dL — ABNORMAL LOW (ref 6–20)
CO2: 26 mmol/L (ref 22–32)
Calcium: 8.3 mg/dL — ABNORMAL LOW (ref 8.9–10.3)
Chloride: 98 mmol/L (ref 98–111)
Creatinine, Ser: 0.75 mg/dL (ref 0.44–1.00)
GFR, Estimated: >60 mL/min (ref >60)
Glucose, Bld: 93 mg/dL (ref 70–99)
Potassium: 3.7 mmol/L (ref 3.5–5.1)
Sodium: 134 mmol/L — ABNORMAL LOW (ref 135–145)

## 2023-09-13 LAB — POCT INFLUENZA A/B
Influenza A, POC: NEGATIVE
Influenza B, POC: NEGATIVE

## 2023-09-13 MED ORDER — BENZONATATE 100 MG PO CAPS: 100.0000 mg | ORAL_CAPSULE | Freq: Three times a day (TID) | ORAL | 0 refills | Status: DC

## 2023-09-13 MED ORDER — ALBUTEROL SULFATE HFA 108 (90 BASE) MCG/ACT IN AERS
1.0000 | INHALATION_SPRAY | Freq: Four times a day (QID) | RESPIRATORY_TRACT | 0 refills | Status: AC | PRN
Start: 2023-09-13 — End: ?

## 2023-09-13 MED ORDER — FLUTICASONE PROPIONATE 50 MCG/ACT NA SUSP: 1.0000 | Freq: Every day | NASAL | 0 refills | Status: AC

## 2023-09-13 MED ORDER — BUDESONIDE-FORMOTEROL FUMARATE 80-4.5 MCG/ACT IN AERO: 2.0000 | INHALATION_SPRAY | Freq: Two times a day (BID) | RESPIRATORY_TRACT | 0 refills | Status: AC

## 2023-09-13 NOTE — ED Triage Notes (Addendum)
Patient states she had nasal congestion last week. Last night she had a sudden onset of chills, body aches and a cough.  Patient states she had liquid caps nighttime, and TheraFlu.  Patieant is requesting a refill of albuterol inhaler.

## 2023-09-13 NOTE — Discharge Instructions (Signed)
You tested negative for flu.  We will contact you if you are positive for COVID to consider starting Paxlovid.  I have sent in a refill of your albuterol.  I would also like you to restart your Symbicort.  Make sure you rinse your mouth following use of this medication to prevent thrush.  Take Tessalon for cough and use fluticasone nasal spray for congestion.  Make sure that you alternate Tylenol and ibuprofen and rest and drink plenty of fluid.  If your symptoms are not improving within a week or if anything worsens and you have shortness of breath despite medication, chest pain, fever, nausea/vomiting, weakness you need to be seen immediately.

## 2023-09-13 NOTE — ED Provider Notes (Signed)
[facility]    CSN: [id number] Arrival date & time: 09/13/23  1627      History   Chief Complaint Chief Complaint  Patient presents with   Generalized Body Aches   Cough   Chills   Medication Refill    HPI Zya Scianna is a 37 y.o. female.   Patient presents today with a 24-hour history of URI symptoms.  She reports nasal congestion, cough, chills, body aches.  She denies any chest pain, shortness of breath, nausea, vomiting.  She has had some mild diarrhea.  Denies any known sick contact; reports that her niece had a runny nose but otherwise was well-appearing.  She has had COVID with last episode earlier this year.  She does have a history of asthma and is requesting a refill of her albuterol inhaler.  Reports that she was hospitalized for asthma exacerbation related to COVID-19 earlier this year.  Denies any recent antibiotics or steroids.  She is confident that she is not pregnant.  She has tried TheraFlu and over-the-counter medications without improvement of symptoms.  She is having difficulty with her daily activities as result of symptoms and feels very poorly.    Past Medical History:  Diagnosis Date   Asthma    Cyst, mediastinum    pt. reports having the cyst removed last year and has reoccured   Diabetes mellitus type II, controlled (HCC) 01/27/2020   Iron deficiency anemia due to chronic blood loss    Morbid obesity (HCC)    s/p gastric bypass surgery in 2009   Pregnancy induced hypertension    2006    Patient Active Problem List   Diagnosis Date Noted   Physical deconditioning 09/20/2020   History of COVID-19 09/20/2020   Pneumonia due to COVID-19 virus 08/16/2020   Shortness of breath 08/16/2020   Snoring 08/16/2020   COVID-19 08/01/2020   Acute respiratory failure with hypoxia (HCC)    Diabetes mellitus type II, controlled (HCC) 01/27/2020   Asthma, chronic    HTN (hypertension)    Iron deficiency anemia due to chronic blood loss     Obesities, morbid (HCC) 08/10/2011    Past Surgical History:  Procedure Laterality Date   BONE CYST EXCISION  2010   CESAREAN SECTION     CESAREAN SECTION  09/02/2011   Procedure: CESAREAN SECTION;  Surgeon: Purcell Nails, MD;  Location: [facility];  Service: Gynecology;  Laterality: N/A;   GASTRIC BYPASS  05/2008   GASTRIC BYPASS  2009   WISDOM TOOTH EXTRACTION  09/2010    OB History     Gravida  3   Para  3   Term  3   Preterm      AB      Living  3      SAB      IAB      Ectopic      Multiple      Live Births  2            Home Medications    Prior to Admission medications   Medication Sig Start Date End Date Taking? Authorizing Provider  albuterol (VENTOLIN HFA) 108 (90 Base) MCG/ACT inhaler Inhale 1-2 puffs into the lungs every 6 (six) hours as needed for wheezing or shortness of breath. 09/13/23  Yes Donicia Druck K, PA-C  benzonatate (TESSALON) 100 MG capsule Take 1 capsule (100 mg total) by mouth every 8 (eight) hours. 09/13/23  Yes Dorsey Charette K, PA-C  fluticasone (  FLONASE) 50 MCG/ACT nasal spray Place 1 spray into both nostrils daily. 09/13/23  Yes Karry Causer K, PA-C  losartan (COZAAR) 50 MG tablet Take 50 mg by mouth daily.   Yes [provider]  budesonide-formoterol (SYMBICORT) 80-4.5 MCG/ACT inhaler INHALE 2 PUFFS INTO THE LUNGS 2 (TWO) TIMES DAILY. 09/13/23 09/12/24  Azael Ragain, Noberto Retort, PA-C  buPROPion (WELLBUTRIN SR) 150 MG 12 hr tablet Take 1 tablet by mouth in the morning and at bedtime.    [provider]  furosemide (LASIX) 20 MG tablet Take 1 tablet by mouth daily. 06/22/21   [provider]  gabapentin (NEURONTIN) 100 MG capsule Take 100 mg by mouth in the morning, at noon, and at bedtime. 01/30/22   [provider]  levonorgestrel (MIRENA) 20 MCG/24HR IUD 1 each by Intrauterine route once.    [provider]  tiZANidine (ZANAFLEX) 2 MG tablet Take 2 mg by mouth every 6 (six) hours as needed for  muscle spasms.    [provider]    Family History Family History  Problem Relation Age of Onset   Asthma Mother    Asthma Brother     Social History Social History   Tobacco Use   Smoking status: Some Days    Types: Cigars   Smokeless tobacco: Never   Tobacco comments:    Smokes 2 Black & Milds daily  Vaping Use   Vaping status: Never Used  Substance Use Topics   Alcohol use: Yes    Comment: occ   Drug use: No     Allergies   Patient has no known allergies.   Review of Systems Review of Systems  Constitutional:  Positive for activity change, chills and fatigue. Negative for appetite change and fever.  HENT:  Positive for congestion and sore throat. Negative for sinus pressure and sneezing.   Respiratory:  Positive for cough. Negative for shortness of breath.   Cardiovascular:  Negative for chest pain.  Gastrointestinal:  Positive for diarrhea. Negative for abdominal pain, nausea and vomiting.  Musculoskeletal:  Positive for arthralgias and myalgias.  Neurological:  Positive for headaches. Negative for dizziness and light-headedness.     Physical Exam Triage Vital Signs ED Triage Vitals [09/13/23 1724]  Encounter Vitals Group     BP (!) 169/102     Systolic BP Percentile      Diastolic BP Percentile      Pulse Rate 100     Resp 16     Temp 99.7 F (37.6 C)     Temp Source Oral     SpO2 94 %     Weight      Height      Head Circumference      Peak Flow      Pain Score 9     Pain Loc      Pain Education      Exclude from Growth Chart    No data found.  Updated Vital Signs BP (!) 169/102 (BP Location: Right Arm)   Pulse 100   Temp 99.7 F (37.6 C) (Oral)   Resp 16   SpO2 94%   Visual Acuity Right Eye Distance:   Left Eye Distance:   Bilateral Distance:    Right Eye Near:   Left Eye Near:    Bilateral Near:     Physical Exam Vitals reviewed.  Constitutional:      General: She is awake. She is not in acute distress.     Appearance: Normal  appearance. She is well-developed. She is not ill-appearing.     Comments: Very pleasant female appears stated age in no acute distress sitting comfortably in exam room  HENT:     Head: Normocephalic and atraumatic.     Right Ear: Tympanic membrane, ear canal and external ear normal. Tympanic membrane is not erythematous or bulging.     Left Ear: Tympanic membrane, ear canal and external ear normal. Tympanic membrane is not erythematous or bulging.     Nose:     Right Sinus: No maxillary sinus tenderness or frontal sinus tenderness.     Left Sinus: No maxillary sinus tenderness or frontal sinus tenderness.     Mouth/Throat:     Pharynx: Uvula midline. Postnasal drip present. No oropharyngeal exudate or posterior oropharyngeal erythema.  Cardiovascular:     Rate and Rhythm: Normal rate and regular rhythm.     Heart sounds: Normal heart sounds, S1 normal and S2 normal. No murmur heard. Pulmonary:     Effort: Pulmonary effort is normal.     Breath sounds: Normal breath sounds. No wheezing, rhonchi or rales.     Comments: Clear to auscultation bilaterally Psychiatric:        Behavior: Behavior is cooperative.      UC Treatments / Results  Labs (all labs ordered are listed, but only abnormal results are displayed) Labs Reviewed  SARS CORONAVIRUS 2 (TAT 6-24 HRS)  BASIC METABOLIC PANEL  POCT INFLUENZA A/B    EKG   Radiology No results found.  Procedures Procedures (including critical care time)  Medications Ordered in UC Medications - No data to display  Initial Impression / Assessment and Plan / UC Course  I have reviewed the triage vital signs and the nursing notes.  Pertinent labs & imaging results that were available during my care of the patient were reviewed by me and considered in my medical decision making (see chart for details).     Patient is well-appearing, afebrile, nontoxic, nontachycardic.  No evidence of acute infection on physical exam  that warrant initiation of antibiotics.  Flu testing was negative.  COVID test is pending.  Given her history of asthma she is a candidate for antiviral therapy so BMP was obtained to monitor her kidney function.  She does not require any medication adjustment with Paxlovid.  She was given Tessalon for cough and fluticasone nasal spray for congestion.  Recommended that she use Mucinex, Flonase, Tylenol, nasal saline/sinus rinses for additional symptom relief.  We discussed that if her symptoms are not improving within a week she needs to return for reevaluation.  If at any point she has worsening symptoms she needs to be seen immediately including shortness of breath despite medication, chest pain, nausea/vomiting interfering with oral intake, worsening cough, fever, weakness.  Strict return precautions given.  Work excuse note provided.  Final Clinical Impressions(s) / UC Diagnoses   Final diagnoses:  Upper respiratory tract infection, unspecified type  Acute cough  Body aches  History of asthma     Discharge Instructions      You tested negative for flu.  We will contact you if you are positive for COVID to consider starting Paxlovid.  I have sent in a refill of your albuterol.  I would also like you to restart your Symbicort.  Make sure you rinse your mouth following use of this medication to prevent thrush.  Take Tessalon for cough and use fluticasone nasal spray for congestion.  Make sure that you alternate Tylenol and  ibuprofen and rest and drink plenty of fluid.  If your symptoms are not improving within a week or if anything worsens and you have shortness of breath despite medication, chest pain, fever, nausea/vomiting, weakness you need to be seen immediately.     ED Prescriptions     Medication Sig Dispense Auth. Provider   budesonide-formoterol (SYMBICORT) 80-4.5 MCG/ACT inhaler INHALE 2 PUFFS INTO THE LUNGS 2 (TWO) TIMES DAILY. 10.2 g Brandan Glauber K, PA-C   albuterol (VENTOLIN  HFA) 108 (90 Base) MCG/ACT inhaler Inhale 1-2 puffs into the lungs every 6 (six) hours as needed for wheezing or shortness of breath. 18 g Von Quintanar K, PA-C   benzonatate (TESSALON) 100 MG capsule Take 1 capsule (100 mg total) by mouth every 8 (eight) hours. 21 capsule Lajoy Vanamburg K, PA-C   fluticasone (FLONASE) 50 MCG/ACT nasal spray Place 1 spray into both nostrils daily. 16 g Aniqa Hare K, PA-C      PDMP not reviewed this encounter.   Jeani Hawking, PA-C 09/13/23 1827

## 2023-09-14 LAB — SARS CORONAVIRUS 2 (TAT 6-24 HRS): SARS Coronavirus 2: NEGATIVE

## 2023-11-29 ENCOUNTER — Ambulatory Visit (INDEPENDENT_AMBULATORY_CARE_PROVIDER_SITE_OTHER): Payer: Medicaid Other | Admitting: Neurology

## 2023-11-29 ENCOUNTER — Encounter: Payer: Self-pay | Admitting: Neurology

## 2023-11-29 ENCOUNTER — Telehealth: Payer: Self-pay | Admitting: Neurology

## 2023-11-29 VITALS — BP 160/100 | HR 86 | Ht 67.0 in | Wt 321.5 lb

## 2023-11-29 DIAGNOSIS — R269 Unspecified abnormalities of gait and mobility: Secondary | ICD-10-CM

## 2023-11-29 DIAGNOSIS — R200 Anesthesia of skin: Secondary | ICD-10-CM | POA: Diagnosis not present

## 2023-11-29 DIAGNOSIS — R208 Other disturbances of skin sensation: Secondary | ICD-10-CM

## 2023-11-29 MED ORDER — GABAPENTIN 300 MG PO CAPS: 300.0000 mg | ORAL_CAPSULE | Freq: Three times a day (TID) | ORAL | 5 refills | Status: AC

## 2023-11-29 MED ORDER — TIZANIDINE HCL 2 MG PO TABS: 2.0000 mg | ORAL_TABLET | Freq: Three times a day (TID) | ORAL | 5 refills | Status: AC

## 2023-11-29 NOTE — Telephone Encounter (Signed)
 healthy blue Berkley Harvey: [phone] exp. 11/29/23-01/27/24 sent to GI [phone]

## 2023-11-29 NOTE — Progress Notes (Signed)
 [facility]  PATIENT: Lacey Whitaker DOB: 1986/04/12  REFERRING DOCTOR OR PCP:  Clayborne Molt, PA-C SOURCE: patient,   _________________________________   HISTORICAL  CHIEF COMPLAINT:  Chief Complaint  Patient presents with   New Patient (Initial Visit)    Pt in room 11 alone. Here for Peripheral neuropathic pain. Pt said she has tingling sensation on left arm, has same sensation in right arm as well at times. Pt said her feet has tingling as well. Feet sensation started about 1 year ago.     HISTORY OF PRESENT ILLNESS:  I had the pleasure of seeing your patient, Lacey Whitaker, at [facility] for neurologic consultation regarding her dysesthesias.  She is a 38 year old woman who noted hitting her left shoulder against a wall 2 years ago.   After that, she noted that the left hand was often painful.   Pain occurs randomly but shaking it sometimes makes it better.  This has persisted.    She only rarely has milder right arm symptoms.    About a year ago, she began to note similar symptoms in the left foot and now in both feet.   Sensation is uncomfortable mor ethan painful.  The foot pain usually worsens with sustained standing.   She needs to stand more at the current job.    She denies weakness.     She feels her gait is fine though balance is mildly off at times..   Bladder function is fine  She has ben on gabapentin  x 1 year with mild benefit only.   She only takes 300 mg po qHS.   Tizanidine  has helped some.      She had a recent B12 level with there PCP office recently and was told she needed to take B12 supplements.  We do not have the actual level.  Denies dry mouth or eyes.      REVIEW OF SYSTEMS: Constitutional: No fevers, chills, sweats, or change in appetite Eyes: No visual changes, double vision, eye pain Ear, nose and throat: No hearing loss, ear pain, nasal congestion, sore throat Cardiovascular: No chest pain,  palpitations Respiratory:  No shortness of breath at rest or with exertion.   No wheezes GastrointestinaI: No nausea, vomiting, diarrhea, abdominal pain, fecal incontinence Genitourinary:  No dysuria, urinary retention or frequency.  No nocturia. Musculoskeletal:  No neck pain, back pain Integumentary: No rash, pruritus, skin lesions Neurological: as above Psychiatric: No depression at this time.  No anxiety Endocrine: No palpitations, diaphoresis, change in appetite, change in weigh or increased thirst Hematologic/Lymphatic:  No anemia, purpura, petechiae. Allergic/Immunologic: No itchy/runny eyes, nasal congestion, recent allergic reactions, rashes  ALLERGIES: Allergies  Allergen Reactions   Lisinopril  Other (See Comments)    Dizzy spells     HOME MEDICATIONS:  Current Outpatient Medications:    albuterol  (VENTOLIN  HFA) 108 (90 Base) MCG/ACT inhaler, Inhale 1-2 puffs into the lungs every 6 (six) hours as needed for wheezing or shortness of breath., Disp: 18 g, Rfl: 0   budesonide -formoterol  (SYMBICORT ) 80-4.5 MCG/ACT inhaler, INHALE 2 PUFFS INTO THE LUNGS 2 (TWO) TIMES DAILY., Disp: 10.2 g, Rfl: 0   buPROPion  (WELLBUTRIN  SR) 150 MG 12 hr tablet, Take 1 tablet by mouth in the morning and at bedtime., Disp: , Rfl:    fluticasone  (FLONASE ) 50 MCG/ACT nasal spray, Place 1 spray into both nostrils daily., Disp: 16 g, Rfl: 0   furosemide  (LASIX ) 20 MG tablet, Take 1 tablet by mouth daily., Disp: ,  Rfl:    levonorgestrel  (MIRENA ) 20 MCG/24HR IUD, 1 each by Intrauterine route once., Disp: , Rfl:    losartan  (COZAAR ) 50 MG tablet, Take 50 mg by mouth daily., Disp: , Rfl:    benzonatate  (TESSALON ) 100 MG capsule, Take 1 capsule (100 mg total) by mouth every 8 (eight) hours. (Patient not taking: Reported on 11/29/2023), Disp: 21 capsule, Rfl: 0   gabapentin  (NEURONTIN ) 300 MG capsule, Take 1 capsule (300 mg total) by mouth in the morning, at noon, and at bedtime., Disp: 90 capsule, Rfl: 5    tiZANidine  (ZANAFLEX ) 2 MG tablet, Take 1 tablet (2 mg total) by mouth 3 (three) times daily., Disp: 90 tablet, Rfl: 5  PAST MEDICAL HISTORY: Past Medical History:  Diagnosis Date   Asthma    Cyst, mediastinum    pt. reports having the cyst removed last year and has reoccured   Diabetes mellitus type II, controlled (HCC) 01/27/2020   Iron deficiency anemia due to chronic blood loss    Morbid obesity (HCC)    s/p gastric bypass surgery in 2009   Pregnancy induced hypertension    2006    PAST SURGICAL HISTORY: Past Surgical History:  Procedure Laterality Date   BONE CYST EXCISION  2010   CESAREAN SECTION     CESAREAN SECTION  09/02/2011   Procedure: CESAREAN SECTION;  Surgeon: Jon CINDERELLA Rummer, MD;  Location: [facility];  Service: Gynecology;  Laterality: N/A;   GASTRIC BYPASS  05/2008   GASTRIC BYPASS  2009   WISDOM TOOTH EXTRACTION  09/2010    FAMILY HISTORY: Family History  Problem Relation Age of Onset   Asthma Mother    Asthma Brother     SOCIAL HISTORY: Social History   Socioeconomic History   Marital status: Single    Spouse name: Not on file   Number of children: 3   Years of education: Not on file   Highest education level: Not on file  Occupational History   Not on file  Tobacco Use   Smoking status: Some Days    Types: Cigars   Smokeless tobacco: Never   Tobacco comments:    Smokes 2 Black & Milds daily  Vaping Use   Vaping status: Never Used  Substance and Sexual Activity   Alcohol use: Yes    Comment: occ   Drug use: No   Sexual activity: Yes    Birth control/protection: I.U.D.  Other Topics Concern   Not on file  Social History Narrative   Works at [employer].3 children: Ages 56,6,2 y/o Lives with her Mom, her brother, and her 3 children.       Right handed    Wear glasses    Rare coffee/soda usage    Social Drivers of Health   Financial Resource Strain: Not on file  Food Insecurity: Not on file  Transportation Needs: Not on file   Physical Activity: Not on file  Stress: Not on file  Social Connections: Not on file  Intimate Partner Violence: Not on file       PHYSICAL EXAM  Vitals:   11/29/23 1601 11/29/23 1611  BP: (!) 171/112 (!) 160/100  Pulse: 86   Weight: (!) 321 lb 8 oz (145.8 kg)   Height: 5' 7 (1.702 m)     Body mass index is 50.35 kg/m.   General: The patient is well-developed and well-nourished and in no acute distress  HEENT:  Head is [place]/AT.  Sclera are anicteric.  Funduscopic exam shows normal optic  discs and retinal vessels.  Neck: No carotid bruits are noted.  The neck is nontender.  Cardiovascular: The heart has a regular rate and rhythm with a normal S1 and S2. There were no murmurs, gallops or rubs.    Skin: Extremities are without rash or  edema.  Musculoskeletal:  Back is nontender  Neurologic Exam  Mental status: The patient is alert and oriented x 3 at the time of the examination. The patient has apparent normal recent and remote memory, with an apparently normal attention span and concentration ability.   Speech is normal.  Cranial nerves: Extraocular movements are full. Pupils are equal, round, and reactive to light and accomodation.  Visual fields are full.  Facial symmetry is present. There is good facial sensation to soft touch bilaterally.Facial strength is normal.  Trapezius and sternocleidomastoid strength is normal. No dysarthria is noted.  The tongue is midline, and the patient has symmetric elevation of the soft palate. No obvious hearing deficits are noted.  Motor:  Muscle bulk is normal.   Tone is normal. Strength is  5 / 5 in all 4 extremities.   Sensory: She has Tinel signs at both elbows and the left wrist.  She had normal sensation to touch and vibration in the arms and mildly reduced vibration sensation at the toes  Coordination: Cerebellar testing reveals good finger-nose-finger and heel-to-shin bilaterally.  Gait and station: Station is normal.   Gait  is normal. Tandem gait is mildly wide. Romberg is negative.   Reflexes: Deep tendon reflexes are symmetric and normal bilaterally.   Plantar responses are flexor.    DIAGNOSTIC DATA (LABS, IMAGING, TESTING) - I reviewed patient records, labs, notes, testing and imaging myself where available.  Lab Results  Component Value Date   WBC 5.6 04/06/2021   HGB 11.5 (L) 04/06/2021   HCT 36.0 04/06/2021   MCV 83.9 04/06/2021   PLT 256 04/06/2021      Component Value Date/Time   NA 134 (L) 09/13/2023 1824   NA 140 08/16/2020 0936   K 3.7 09/13/2023 1824   CL 98 09/13/2023 1824   CO2 26 09/13/2023 1824   GLUCOSE 93 09/13/2023 1824   BUN <5 (L) 09/13/2023 1824   BUN 10 08/16/2020 0936   CREATININE 0.75 09/13/2023 1824   CREATININE 0.82 01/19/2020 1515   CALCIUM  8.3 (L) 09/13/2023 1824   PROT 6.1 08/16/2020 0936   ALBUMIN 3.5 (L) 08/16/2020 0936   AST 15 08/16/2020 0936   ALT 14 08/16/2020 0936   ALKPHOS 83 08/16/2020 0936   BILITOT 0.2 08/16/2020 0936   GFRNONAA >60 09/13/2023 1824   GFRNONAA 93 01/19/2020 1515   GFRAA 111 08/16/2020 0936   GFRAA 107 01/19/2020 1515   Lab Results  Component Value Date   CHOL 227 (H) 01/19/2020   HDL 66 01/19/2020   LDLCALC 135 (H) 01/19/2020   TRIG 133 08/01/2020   CHOLHDL 3.4 01/19/2020   Lab Results  Component Value Date   HGBA1C 5.9 (H) 01/26/2020   Lab Results  Component Value Date   VITAMINB12 316 03/17/2014   No results found for: TSH     ASSESSMENT AND PLAN  Numbness - Plan: MR CERVICAL SPINE WO CONTRAST  Gait disturbance - Plan: MR CERVICAL SPINE WO CONTRAST  Dysesthesia  In summary, Ms. Ocampo is a 38 year old woman with dysesthesias, numbness and mild gait disturbance.  The etiology is uncertain.  Although she does appear to have mild ulnar neuropathies at the elbows, this does not  explain symptoms in the legs.  We need to check an MRI of the cervical spine to determine if an intrinsic or extrinsic myelopathy is  playing a role in her symptoms.  Based on results, she may need further evaluation or referral for an intervention.  To help with the dysesthesias, I will increase her gabapentin  from 300 mg nightly to 300 mg 3 times daily.  Will also we knew her tizanidine  as it helps her muscle spasms.  If symptoms do not improve and the MRI of the cervical spine does not reveal any significant abnormality, consider NCV/EMG for further evaluation.  Also consider retesting the B12 to make sure she is absorbing the oral tablets and check SSA/SSB and SPEP/IEF.  She will return to see us  as needed based on the results of the studies or if new or worsening symptoms.  Thank you for asking me to see Ms. Kesinger.  Please let me know if I can be of further assistance with her or other patients in the future.    Lillar Bianca A. Vear, MD, [facility] 11/29/2023, 9:18 PM Certified in Neurology, Clinical Neurophysiology, Sleep Medicine and Neuroimaging  [facility] [address], [address] [place], [place] [address] [phone]

## 2023-12-06 ENCOUNTER — Encounter: Payer: Self-pay | Admitting: Podiatry

## 2023-12-06 ENCOUNTER — Ambulatory Visit: Payer: Medicaid Other | Admitting: Podiatry

## 2023-12-06 VITALS — Ht 67.0 in

## 2023-12-06 DIAGNOSIS — G629 Polyneuropathy, unspecified: Secondary | ICD-10-CM | POA: Diagnosis not present

## 2023-12-06 NOTE — Progress Notes (Signed)
  Subjective:  Patient ID: Lacey Whitaker, female    DOB: 04/15/1986,  MRN: [id number]  Chief Complaint  Patient presents with   Numbness    She is here for numbness and tingling in the toes     Discussed the use of AI scribe software for clinical note transcription with the patient, who gave verbal consent to proceed.  History of Present Illness   The patient, who recently started a new job that involves prolonged standing, presents with numbness in the toes that has progressively involved the entire foot. The numbness started in the last three toes and gradually spread to all the toes and then the entire foot. The patient reports that the numbness is not painful but is irritating and uncomfortable. The patient also reports a similar numbness in the other foot. The patient describes the sensation as faint and different from the normal sensation in the rest of the foot. The patient also reports a history of muscle spasms and a nerve issue in the arm that causes tingling and radiating pain. The patient is currently taking gabapentin for this issue. The patient has no history of diabetes but has been advised to change eating habits due to being borderline. The patient's B12 and folate levels were found to be low in recent tests. The patient drinks alcohol occasionally.          Objective:    Physical Exam   EXTREMITIES: Palpable pulses. Strong dorsalis pedis and posterior tibial pulses. No notable deformities, ecchymosis, or bruising. MUSCULOSKELETAL: Feet warm and well-padded. NEUROLOGICAL: Altered sensation to light touch and pressure from the tips of the toes to the dorsal and plantar midfoot, consistent with a stocking glove distribution and not following a single dermatome. No evidence of tarsal tunnel syndrome in the left or right foot or ankle.       No images are attached to the encounter.    Results   LABS Vitamin B12: low      Assessment:   1. Polyneuropathy, unspecified       Plan:  Patient was evaluated and treated and all questions answered.  Assessment and Plan    Peripheral Neuropathy Numbness in a stocking-glove distribution in both feet and hands, more pronounced in the feet. No evidence of tarsal tunnel syndrome. No history of diabetes, heavy alcohol use, or other common causes of neuropathy. Recently started on Gabapentin for similar symptoms in the arm. -Continue Gabapentin as prescribed by neurologist. -Start B12 supplementation due to slightly low levels. -Consider nerve conduction test on feet if neurologist is already planning one for the arm. -Consider lifestyle modifications such as low inflammation diet and reducing alcohol intake. -Check feet daily for any injuries due to altered sensation. -Follow-up with neurologist for MRI and further testing.  Muscle Spasms History of muscle spasms, currently managed with muscle relaxers. -Continue current management.  Borderline Diabetes History of borderline A1C levels, no diagnosis of diabetes. -Continue monitoring A1C levels. -Consider lifestyle modifications for prevention.          No follow-ups on file.

## 2023-12-06 NOTE — Patient Instructions (Signed)
VISIT SUMMARY:  You visited [place] today due to numbness in your toes that has spread to your entire foot, which you have been experiencing since starting a new job that involves prolonged standing. You also reported similar numbness in the other foot, as well as a history of muscle spasms and a nerve issue in your arm. Your recent tests showed low B12 and folate levels, and you have been advised to change your eating habits due to being borderline diabetic.  YOUR PLAN:  -PERIPHERAL NEUROPATHY: Peripheral neuropathy is a condition that results from damage to the nerves outside of the brain and spinal cord, often causing weakness, numbness, and pain, usually in the hands and feet. You should continue taking Gabapentin as prescribed by your neurologist. We recommend starting B12 supplementation due to your slightly low levels. A nerve conduction test on your feet may be considered if your neurologist is planning one for your arm. Additionally, consider lifestyle changes such as a low inflammation diet and reducing alcohol intake. Check your feet daily for any injuries due to altered sensation and follow up with your neurologist for an MRI and further testing.  -MUSCLE SPASMS: Muscle spasms are sudden, involuntary contractions of a muscle or group of muscles. You should continue with your current management plan, which includes taking muscle relaxers.  -BORDERLINE DIABETES: Borderline diabetes, also known as prediabetes, is a condition where blood sugar levels are higher than normal but not high enough to be classified as diabetes. You should continue monitoring your A1C levels and consider lifestyle modifications to prevent the progression to diabetes.  INSTRUCTIONS:  Please follow up with your neurologist for an MRI and further testing. Continue monitoring your A1C levels and consider lifestyle modifications to prevent diabetes. Check your feet daily for any injuries due to altered sensation.

## 2023-12-20 ENCOUNTER — Other Ambulatory Visit: Payer: Medicaid Other

## 2024-01-03 ENCOUNTER — Inpatient Hospital Stay: Admission: RE | Admit: 2024-01-03 | Payer: Medicaid Other | Source: Ambulatory Visit

## 2024-02-11 ENCOUNTER — Ambulatory Visit: Payer: Medicaid Other | Admitting: Neurology

## 2024-08-14 ENCOUNTER — Institutional Professional Consult (permissible substitution): Admitting: Nurse Practitioner

## 2024-09-01 ENCOUNTER — Encounter (INDEPENDENT_AMBULATORY_CARE_PROVIDER_SITE_OTHER): Payer: Self-pay
